# Patient Record
Sex: Male | Born: 1965
Health system: Southern US, Community
[De-identification: ages and names within clinical notes are randomized; demographics above are authoritative.]

## PROBLEM LIST (undated history)

## (undated) DIAGNOSIS — I10 Essential (primary) hypertension: Secondary | ICD-10-CM

## (undated) DIAGNOSIS — H548 Legal blindness, as defined in USA: Secondary | ICD-10-CM

## (undated) HISTORY — PX: NO PAST SURGERIES: SHX2092

---

## 2005-11-09 DIAGNOSIS — H548 Legal blindness, as defined in USA: Secondary | ICD-10-CM

## 2005-11-09 HISTORY — DX: Legal blindness, as defined in USA: H54.8

## 2019-06-27 ENCOUNTER — Ambulatory Visit (INDEPENDENT_AMBULATORY_CARE_PROVIDER_SITE_OTHER): Payer: Medicare Other | Admitting: Sports Medicine

## 2019-06-27 ENCOUNTER — Encounter: Payer: Self-pay | Admitting: Sports Medicine

## 2019-06-27 ENCOUNTER — Other Ambulatory Visit: Payer: Self-pay

## 2019-06-27 ENCOUNTER — Ambulatory Visit (INDEPENDENT_AMBULATORY_CARE_PROVIDER_SITE_OTHER): Payer: Medicare Other

## 2019-06-27 VITALS — BP 133/75 | Temp 97.3°F

## 2019-06-27 DIAGNOSIS — M79671 Pain in right foot: Secondary | ICD-10-CM | POA: Diagnosis not present

## 2019-06-27 DIAGNOSIS — M792 Neuralgia and neuritis, unspecified: Secondary | ICD-10-CM

## 2019-06-27 DIAGNOSIS — M7741 Metatarsalgia, right foot: Secondary | ICD-10-CM

## 2019-06-27 MED ORDER — DICLOFENAC SODIUM 1 % TD GEL: 4.0000 g | Freq: Four times a day (QID) | TRANSDERMAL | 1 refills | Status: DC

## 2019-06-27 NOTE — Progress Notes (Signed)
Subjective: Wayne Mckinney is a 53 y.o. male patient who presents to office for evaluation of Right foot pain. 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 Patient has tried epsom salt with no relief in symptoms. Patient denies any other pedal complaints. Denies injury/trip/fall/sprain/any causative factors.   Review of Systems  All other systems reviewed and are negative.    There are no active problems to display for this patient.   Current Outpatient Medications on File Prior to Visit  Medication Sig Dispense Refill  . amLODipine (NORVASC) 10 MG tablet Take 10 mg by mouth daily.    . hydrochlorothiazide (MICROZIDE) 12.5 MG capsule Take by mouth daily.     No current facility-administered medications on file prior to visit.     No Known Allergies  Objective:  General: Alert and oriented x3 in no acute distress  Dermatology: No open lesions bilateral lower extremities, no webspace macerations, no ecchymosis bilateral, all nails x 10 are well manicured.  Vascular: Dorsalis Pedis and Posterior Tibial pedal pulses palpable, Capillary Fill Time 3 seconds,(+) pedal hair growth bilateral, no edema bilateral lower extremities, Temperature gradient within normal limits.  Neurology: Wayne Mckinney sensation intact via light touch bilateral. Other sensations hard to discern due to blind/patient status  Musculoskeletal: Minimal pain diffusely to right ball. No other acute findings.   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  Landis Martins, DPM

## 2019-06-28 ENCOUNTER — Other Ambulatory Visit: Payer: Self-pay | Admitting: Sports Medicine

## 2019-06-28 DIAGNOSIS — M7741 Metatarsalgia, right foot: Secondary | ICD-10-CM

## 2019-12-14 DIAGNOSIS — Z0000 Encounter for general adult medical examination without abnormal findings: Secondary | ICD-10-CM | POA: Diagnosis not present

## 2019-12-14 DIAGNOSIS — Z79899 Other long term (current) drug therapy: Secondary | ICD-10-CM | POA: Diagnosis not present

## 2019-12-14 DIAGNOSIS — Z131 Encounter for screening for diabetes mellitus: Secondary | ICD-10-CM | POA: Diagnosis not present

## 2019-12-14 DIAGNOSIS — N529 Male erectile dysfunction, unspecified: Secondary | ICD-10-CM | POA: Diagnosis not present

## 2019-12-14 DIAGNOSIS — I10 Essential (primary) hypertension: Secondary | ICD-10-CM | POA: Diagnosis not present

## 2019-12-14 DIAGNOSIS — H548 Legal blindness, as defined in USA: Secondary | ICD-10-CM | POA: Diagnosis not present

## 2019-12-14 DIAGNOSIS — Z13220 Encounter for screening for lipoid disorders: Secondary | ICD-10-CM | POA: Diagnosis not present

## 2019-12-14 DIAGNOSIS — E559 Vitamin D deficiency, unspecified: Secondary | ICD-10-CM | POA: Diagnosis not present

## 2019-12-14 DIAGNOSIS — Z125 Encounter for screening for malignant neoplasm of prostate: Secondary | ICD-10-CM | POA: Diagnosis not present

## 2019-12-28 DIAGNOSIS — Z20828 Contact with and (suspected) exposure to other viral communicable diseases: Secondary | ICD-10-CM | POA: Diagnosis not present

## 2019-12-28 DIAGNOSIS — R05 Cough: Secondary | ICD-10-CM | POA: Diagnosis not present

## 2020-01-10 DIAGNOSIS — R05 Cough: Secondary | ICD-10-CM | POA: Diagnosis not present

## 2020-01-10 DIAGNOSIS — Z20828 Contact with and (suspected) exposure to other viral communicable diseases: Secondary | ICD-10-CM | POA: Diagnosis not present

## 2020-01-11 DIAGNOSIS — I10 Essential (primary) hypertension: Secondary | ICD-10-CM | POA: Diagnosis not present

## 2020-01-11 DIAGNOSIS — N529 Male erectile dysfunction, unspecified: Secondary | ICD-10-CM | POA: Diagnosis not present

## 2020-01-17 DIAGNOSIS — R05 Cough: Secondary | ICD-10-CM | POA: Diagnosis not present

## 2020-01-17 DIAGNOSIS — Z20828 Contact with and (suspected) exposure to other viral communicable diseases: Secondary | ICD-10-CM | POA: Diagnosis not present

## 2020-01-29 ENCOUNTER — Ambulatory Visit: Payer: Medicare Other | Attending: Internal Medicine

## 2020-01-29 ENCOUNTER — Ambulatory Visit: Payer: Self-pay

## 2020-01-29 DIAGNOSIS — Z23 Encounter for immunization: Secondary | ICD-10-CM

## 2020-01-29 NOTE — Progress Notes (Signed)
   Covid-19 Vaccination Clinic  Name:  Wayne Mckinney    MRN: [id number] DOB: 12/20/1965  01/29/2020  [name] was observed post Covid-19 immunization for 15 minutes without incident. He was provided with Vaccine Information Sheet and instruction to access the V-Safe system.   [name] was instructed to call 911 with any severe reactions post vaccine: Marland Kitchen Difficulty breathing  . Swelling of face and throat  . A fast heartbeat  . A bad rash all over body  . Dizziness and weakness   Immunizations Administered    Name Date Dose VIS Date Route   Pfizer COVID-19 Vaccine 01/29/2020  9:05 AM 0.3 mL 10/20/2019 Intramuscular   Manufacturer: [employer], [employer]   Lot: [id number]   NDC: [address]-3

## 2020-02-02 DIAGNOSIS — R05 Cough: Secondary | ICD-10-CM | POA: Diagnosis not present

## 2020-02-02 DIAGNOSIS — Z20828 Contact with and (suspected) exposure to other viral communicable diseases: Secondary | ICD-10-CM | POA: Diagnosis not present

## 2020-02-07 DIAGNOSIS — Z20828 Contact with and (suspected) exposure to other viral communicable diseases: Secondary | ICD-10-CM | POA: Diagnosis not present

## 2020-02-07 DIAGNOSIS — R05 Cough: Secondary | ICD-10-CM | POA: Diagnosis not present

## 2020-02-14 DIAGNOSIS — R05 Cough: Secondary | ICD-10-CM | POA: Diagnosis not present

## 2020-02-14 DIAGNOSIS — Z20828 Contact with and (suspected) exposure to other viral communicable diseases: Secondary | ICD-10-CM | POA: Diagnosis not present

## 2020-02-21 DIAGNOSIS — R05 Cough: Secondary | ICD-10-CM | POA: Diagnosis not present

## 2020-02-21 DIAGNOSIS — Z20828 Contact with and (suspected) exposure to other viral communicable diseases: Secondary | ICD-10-CM | POA: Diagnosis not present

## 2020-02-26 ENCOUNTER — Ambulatory Visit: Payer: Medicare Other | Attending: Internal Medicine

## 2020-02-26 DIAGNOSIS — Z23 Encounter for immunization: Secondary | ICD-10-CM

## 2020-02-26 NOTE — Progress Notes (Signed)
   Covid-19 Vaccination Clinic  Name:  Wayne Mckinney    MRN: [id number] DOB: 1966-05-06  02/26/2020  Mr. [name] was observed post Covid-19 immunization for 15 minutes without incident. He was provided with Vaccine Information Sheet and instruction to access the V-Safe system.   Mr. [name] was instructed to call 911 with any severe reactions post vaccine: Marland Kitchen Difficulty breathing  . Swelling of face and throat  . A fast heartbeat  . A bad rash all over body  . Dizziness and weakness   Immunizations Administered    Name Date Dose VIS Date Route   Pfizer COVID-19 Vaccine 02/26/2020  9:17 AM 0.3 mL 01/03/2019 Intramuscular   Manufacturer: [employer], [employer]   Lot: [id number]   NDC: [address]-6

## 2020-04-04 DIAGNOSIS — H548 Legal blindness, as defined in USA: Secondary | ICD-10-CM | POA: Diagnosis not present

## 2020-04-04 DIAGNOSIS — M25579 Pain in unspecified ankle and joints of unspecified foot: Secondary | ICD-10-CM | POA: Diagnosis not present

## 2020-04-04 DIAGNOSIS — I10 Essential (primary) hypertension: Secondary | ICD-10-CM | POA: Diagnosis not present

## 2020-07-18 DIAGNOSIS — H548 Legal blindness, as defined in USA: Secondary | ICD-10-CM | POA: Diagnosis not present

## 2020-07-18 DIAGNOSIS — E559 Vitamin D deficiency, unspecified: Secondary | ICD-10-CM | POA: Diagnosis not present

## 2020-07-18 DIAGNOSIS — Z23 Encounter for immunization: Secondary | ICD-10-CM | POA: Diagnosis not present

## 2020-07-18 DIAGNOSIS — I10 Essential (primary) hypertension: Secondary | ICD-10-CM | POA: Diagnosis not present

## 2020-08-02 DIAGNOSIS — Z20828 Contact with and (suspected) exposure to other viral communicable diseases: Secondary | ICD-10-CM | POA: Diagnosis not present

## 2020-08-02 DIAGNOSIS — R05 Cough: Secondary | ICD-10-CM | POA: Diagnosis not present

## 2020-08-03 DIAGNOSIS — R05 Cough: Secondary | ICD-10-CM | POA: Diagnosis not present

## 2020-08-03 DIAGNOSIS — Z20828 Contact with and (suspected) exposure to other viral communicable diseases: Secondary | ICD-10-CM | POA: Diagnosis not present

## 2020-11-06 ENCOUNTER — Ambulatory Visit (INDEPENDENT_AMBULATORY_CARE_PROVIDER_SITE_OTHER): Payer: BC Managed Care – PPO | Admitting: Podiatry

## 2020-11-06 ENCOUNTER — Encounter: Payer: Self-pay | Admitting: Podiatry

## 2020-11-06 ENCOUNTER — Other Ambulatory Visit: Payer: Self-pay

## 2020-11-06 DIAGNOSIS — M792 Neuralgia and neuritis, unspecified: Secondary | ICD-10-CM | POA: Diagnosis not present

## 2020-11-06 DIAGNOSIS — R200 Anesthesia of skin: Secondary | ICD-10-CM

## 2020-11-06 DIAGNOSIS — R202 Paresthesia of skin: Secondary | ICD-10-CM | POA: Diagnosis not present

## 2020-11-06 NOTE — Progress Notes (Signed)
  Subjective:  Patient ID: Wayne Mckinney, male    DOB: 11/22/1965,  MRN: [id number]  Chief Complaint  Patient presents with  . Numbness    Bilateral numbness in feet and moving up towards the legs PT stated that if he stands he will loose his balance and fall because of the pain.    54 y.o. male presents with the above complaint.  Patient presents with complaint of numbness and tingling to both feet that seems to be going all the way up the leg.  Patient states that hurts and it caused him to lose balance and fall again.  Patient states is pins and needle type of pain.  She would like to get it evaluated.  He has not seen anyone else prior to seeing me for this.  He is not a diabetic.  He does not have any history of sciatica that he is aware of.  He constantly sits down for his job and is constantly on his buttocks.   Review of Systems: Negative except as noted in the HPI. Denies N/V/F/Ch.  No past medical history on file.  Current Outpatient Medications:  .  amLODipine (NORVASC) 10 MG tablet, Take 10 mg by mouth daily., Disp: , Rfl:  .  diclofenac sodium (VOLTAREN) 1 % GEL, Apply 4 g topically 4 (four) times daily., Disp: 150 g, Rfl: 1 .  hydrochlorothiazide (MICROZIDE) 12.5 MG capsule, Take by mouth daily., Disp: , Rfl:   Social History   Tobacco Use  Smoking Status Former Smoker  Smokeless Tobacco Former User    No Known Allergies Objective:  There were no vitals filed for this visit. There is no height or weight on file to calculate BMI. Constitutional Well developed. Well nourished.  Vascular Dorsalis pedis pulses palpable bilaterally. Posterior tibial pulses palpable bilaterally. Capillary refill normal to all digits.  No cyanosis or clubbing noted. Pedal hair growth normal.  Neurologic Normal speech. Oriented to person, place, and time. Epicritic sensation to light touch grossly present bilaterally.  Subjective numbness and pain of and tingling that is extending from  the lower hip down to the foot and ankle.  Negative tarsal tunnel syndrome negative common peroneal syndrome  Dermatologic  nails within normal limits skin within normal limits.  Orthopedic: Normal joint ROM without pain or crepitus bilaterally. No visible deformities. No bony tenderness.   Radiographs: None Assessment:   1. Numbness and tingling   2. Neuritis    Plan:  Patient was evaluated and treated and all questions answered.  Bilateral numbness and tingling to lower extremity secondary to possible sciatica -I explained to the patient the etiology of numbness and tingling various treatment options were extensively discussed especially in the setting of diabetes or any other neurological medical history.  I believe that given the patient's work he required him to sit on his buttocks for most of the time it could be putting excessive pressure to the sciatic nerve leading to numbness and tingling down the both lower extremity.  There could also be a component of lower back as well.  I discussed this with patient extensive detail.  Patient states understanding will follow with the primary care doctor for a referral to a spine/orthopedic specialist.  No follow-ups on file.

## 2020-11-11 ENCOUNTER — Ambulatory Visit (HOSPITAL_COMMUNITY)
Admission: RE | Admit: 2020-11-11 | Discharge: 2020-11-11 | Disposition: A | Discharge status: Home / Self Care | Payer: BC Managed Care – PPO | Source: Ambulatory Visit | Attending: Neurological Surgery | Admitting: Neurological Surgery

## 2020-11-11 ENCOUNTER — Other Ambulatory Visit: Payer: Self-pay | Admitting: Neurological Surgery

## 2020-11-11 DIAGNOSIS — R270 Ataxia, unspecified: Secondary | ICD-10-CM | POA: Diagnosis not present

## 2020-11-11 DIAGNOSIS — R29898 Other symptoms and signs involving the musculoskeletal system: Secondary | ICD-10-CM | POA: Diagnosis not present

## 2020-11-11 DIAGNOSIS — M4714 Other spondylosis with myelopathy, thoracic region: Secondary | ICD-10-CM | POA: Diagnosis not present

## 2020-11-11 DIAGNOSIS — R200 Anesthesia of skin: Secondary | ICD-10-CM

## 2020-11-11 DIAGNOSIS — Z20828 Contact with and (suspected) exposure to other viral communicable diseases: Secondary | ICD-10-CM | POA: Diagnosis not present

## 2020-11-11 DIAGNOSIS — M5414 Radiculopathy, thoracic region: Secondary | ICD-10-CM | POA: Diagnosis not present

## 2020-11-11 DIAGNOSIS — M2428 Disorder of ligament, vertebrae: Secondary | ICD-10-CM | POA: Diagnosis not present

## 2020-11-11 DIAGNOSIS — M47814 Spondylosis without myelopathy or radiculopathy, thoracic region: Secondary | ICD-10-CM | POA: Diagnosis not present

## 2020-11-11 DIAGNOSIS — M47812 Spondylosis without myelopathy or radiculopathy, cervical region: Secondary | ICD-10-CM | POA: Diagnosis not present

## 2020-11-11 DIAGNOSIS — M5126 Other intervertebral disc displacement, lumbar region: Secondary | ICD-10-CM | POA: Diagnosis not present

## 2020-11-11 DIAGNOSIS — M47816 Spondylosis without myelopathy or radiculopathy, lumbar region: Secondary | ICD-10-CM | POA: Diagnosis not present

## 2020-11-12 DIAGNOSIS — Z20828 Contact with and (suspected) exposure to other viral communicable diseases: Secondary | ICD-10-CM | POA: Diagnosis not present

## 2020-11-13 ENCOUNTER — Other Ambulatory Visit (HOSPITAL_COMMUNITY): Payer: Self-pay | Admitting: Neurological Surgery

## 2020-11-13 ENCOUNTER — Other Ambulatory Visit: Payer: Self-pay | Admitting: Neurological Surgery

## 2020-11-13 DIAGNOSIS — G9520 Unspecified cord compression: Secondary | ICD-10-CM

## 2020-11-13 DIAGNOSIS — R29898 Other symptoms and signs involving the musculoskeletal system: Secondary | ICD-10-CM | POA: Diagnosis not present

## 2020-11-13 DIAGNOSIS — I10 Essential (primary) hypertension: Secondary | ICD-10-CM | POA: Diagnosis not present

## 2020-11-14 DIAGNOSIS — I10 Essential (primary) hypertension: Secondary | ICD-10-CM | POA: Diagnosis not present

## 2020-11-14 DIAGNOSIS — M5136 Other intervertebral disc degeneration, lumbar region: Secondary | ICD-10-CM | POA: Diagnosis not present

## 2020-11-15 ENCOUNTER — Other Ambulatory Visit: Payer: Self-pay

## 2020-11-15 ENCOUNTER — Other Ambulatory Visit: Payer: Self-pay | Admitting: Neurological Surgery

## 2020-11-15 ENCOUNTER — Ambulatory Visit (HOSPITAL_COMMUNITY)
Admission: RE | Admit: 2020-11-15 | Discharge: 2020-11-15 | Disposition: A | Discharge status: Home / Self Care | Payer: BC Managed Care – PPO | Source: Ambulatory Visit | Attending: Neurological Surgery | Admitting: Neurological Surgery

## 2020-11-15 DIAGNOSIS — M47812 Spondylosis without myelopathy or radiculopathy, cervical region: Secondary | ICD-10-CM | POA: Diagnosis not present

## 2020-11-15 DIAGNOSIS — G9520 Unspecified cord compression: Secondary | ICD-10-CM | POA: Diagnosis not present

## 2020-11-15 DIAGNOSIS — M50223 Other cervical disc displacement at C6-C7 level: Secondary | ICD-10-CM | POA: Diagnosis not present

## 2020-11-15 DIAGNOSIS — M4802 Spinal stenosis, cervical region: Secondary | ICD-10-CM | POA: Diagnosis not present

## 2020-11-15 DIAGNOSIS — M2578 Osteophyte, vertebrae: Secondary | ICD-10-CM | POA: Diagnosis not present

## 2020-11-18 ENCOUNTER — Other Ambulatory Visit (HOSPITAL_COMMUNITY)
Admission: RE | Admit: 2020-11-18 | Discharge: 2020-11-18 | Disposition: A | Discharge status: Home / Self Care | Payer: BC Managed Care – PPO | Source: Ambulatory Visit | Attending: Neurological Surgery | Admitting: Neurological Surgery

## 2020-11-18 ENCOUNTER — Encounter (HOSPITAL_COMMUNITY): Payer: Self-pay | Admitting: Neurological Surgery

## 2020-11-18 DIAGNOSIS — M4322 Fusion of spine, cervical region: Secondary | ICD-10-CM | POA: Diagnosis not present

## 2020-11-18 DIAGNOSIS — M62830 Muscle spasm of back: Secondary | ICD-10-CM | POA: Diagnosis not present

## 2020-11-18 DIAGNOSIS — R292 Abnormal reflex: Secondary | ICD-10-CM | POA: Diagnosis not present

## 2020-11-18 DIAGNOSIS — R270 Ataxia, unspecified: Secondary | ICD-10-CM | POA: Diagnosis not present

## 2020-11-18 DIAGNOSIS — Z01812 Encounter for preprocedural laboratory examination: Secondary | ICD-10-CM | POA: Insufficient documentation

## 2020-11-18 DIAGNOSIS — Z20822 Contact with and (suspected) exposure to covid-19: Secondary | ICD-10-CM | POA: Insufficient documentation

## 2020-11-18 DIAGNOSIS — M50023 Cervical disc disorder at C6-C7 level with myelopathy: Secondary | ICD-10-CM | POA: Diagnosis not present

## 2020-11-18 DIAGNOSIS — R296 Repeated falls: Secondary | ICD-10-CM | POA: Diagnosis not present

## 2020-11-18 DIAGNOSIS — Z79899 Other long term (current) drug therapy: Secondary | ICD-10-CM | POA: Diagnosis not present

## 2020-11-18 DIAGNOSIS — S8002XA Contusion of left knee, initial encounter: Secondary | ICD-10-CM | POA: Diagnosis not present

## 2020-11-18 DIAGNOSIS — K5901 Slow transit constipation: Secondary | ICD-10-CM | POA: Diagnosis not present

## 2020-11-18 DIAGNOSIS — H3530 Unspecified macular degeneration: Secondary | ICD-10-CM | POA: Diagnosis not present

## 2020-11-18 DIAGNOSIS — G9520 Unspecified cord compression: Secondary | ICD-10-CM | POA: Diagnosis not present

## 2020-11-18 DIAGNOSIS — G959 Disease of spinal cord, unspecified: Secondary | ICD-10-CM | POA: Diagnosis not present

## 2020-11-18 DIAGNOSIS — K5900 Constipation, unspecified: Secondary | ICD-10-CM | POA: Diagnosis not present

## 2020-11-18 DIAGNOSIS — E8809 Other disorders of plasma-protein metabolism, not elsewhere classified: Secondary | ICD-10-CM | POA: Diagnosis not present

## 2020-11-18 DIAGNOSIS — I10 Essential (primary) hypertension: Secondary | ICD-10-CM | POA: Diagnosis not present

## 2020-11-18 DIAGNOSIS — R569 Unspecified convulsions: Secondary | ICD-10-CM | POA: Diagnosis not present

## 2020-11-18 DIAGNOSIS — Z4789 Encounter for other orthopedic aftercare: Secondary | ICD-10-CM | POA: Diagnosis not present

## 2020-11-18 DIAGNOSIS — Z23 Encounter for immunization: Secondary | ICD-10-CM | POA: Diagnosis not present

## 2020-11-18 DIAGNOSIS — H548 Legal blindness, as defined in USA: Secondary | ICD-10-CM | POA: Diagnosis not present

## 2020-11-18 DIAGNOSIS — M4802 Spinal stenosis, cervical region: Secondary | ICD-10-CM | POA: Diagnosis not present

## 2020-11-18 DIAGNOSIS — G629 Polyneuropathy, unspecified: Secondary | ICD-10-CM | POA: Diagnosis not present

## 2020-11-18 DIAGNOSIS — M792 Neuralgia and neuritis, unspecified: Secondary | ICD-10-CM | POA: Diagnosis not present

## 2020-11-18 DIAGNOSIS — M4712 Other spondylosis with myelopathy, cervical region: Secondary | ICD-10-CM | POA: Diagnosis not present

## 2020-11-18 DIAGNOSIS — E876 Hypokalemia: Secondary | ICD-10-CM | POA: Diagnosis not present

## 2020-11-18 DIAGNOSIS — Z87891 Personal history of nicotine dependence: Secondary | ICD-10-CM | POA: Diagnosis not present

## 2020-11-18 DIAGNOSIS — Z981 Arthrodesis status: Secondary | ICD-10-CM | POA: Diagnosis not present

## 2020-11-18 LAB — SARS CORONAVIRUS 2 (TAT 6-24 HRS): SARS Coronavirus 2: NEGATIVE

## 2020-11-18 NOTE — Progress Notes (Addendum)
Patient denies cardiology visit. Patient reports tightness in the side of his chest and mild shortness of breath. Patient reports that he attributes this to his neck issues. Notified [place], [place].  Reports being in quarantine since COVID test. Educated on visitation policy. PCP [facility]. Requested EKG and OV from [facility].

## 2020-11-18 NOTE — Anesthesia Preprocedure Evaluation (Addendum)
Anesthesia Evaluation  Patient identified by MRN, date of birth, ID band Patient awake    Reviewed: Allergy & Precautions, H&P , NPO status , Patient's Chart, lab work & pertinent test results  Airway Mallampati: II   Neck ROM: limited    Dental   Pulmonary former smoker,    breath sounds clear to auscultation       Cardiovascular hypertension,  Rhythm:regular Rate:Normal     Neuro/Psych Cervical cord compression  Neuromuscular disease    GI/Hepatic   Endo/Other    Renal/GU      Musculoskeletal   Abdominal   Peds  Hematology   Anesthesia Other Findings   Reproductive/Obstetrics                            Anesthesia Physical Anesthesia Plan  ASA: II  Anesthesia Plan: General   Post-op Pain Management:    Induction: Intravenous  PONV Risk Score and Plan: 2 and Ondansetron, Dexamethasone, Midazolam and Treatment may vary due to age or medical condition  Airway Management Planned: Oral ETT  Additional Equipment:   Intra-op Plan:   Post-operative Plan: Extubation in OR  Informed Consent: I have reviewed the patients History and Physical, chart, labs and discussed the procedure including the risks, benefits and alternatives for the proposed anesthesia with the patient or authorized representative who has indicated his/her understanding and acceptance.       Plan Discussed with: CRNA, Anesthesiologist and Surgeon  Anesthesia Plan Comments: (See PAT note written 11/18/2020 by Shonna Chock, PA-C. SAME DAY WORK-UP. For labs, EKG on day of surgery.    )       Anesthesia Quick Evaluation

## 2020-11-18 NOTE — Progress Notes (Signed)
Anesthesia Chart Review:  Case: [id number] Date/Time: 11/19/20 1136   Procedure: ACDF C56, C67 (N/A ) - 3C   Anesthesia type: General   Pre-op diagnosis: CERVICAL CORD COMPRESSION   Location: [facility] ROOM 20 / [facility] OR   Surgeons: Dawley, Alan Mulder, DO      DISCUSSION: Patient is a 55 year old male scheduled for the above procedure.   History includes former smoker (quit ~ 2017), HTN, legally blind. Alcohol use is documented as 2 standard drinks/week.   He was urgently referred to spine surgery following 11/06/20 visit with Nicholes Rough, DPM for BLE N/T with pain and causing him to loss balance and fall. He was seen by Dr. Jake Samples on 11/11/20. Also reported some lower trunk sensory changes radiating from his back and had "slight hyperreflexia" in his legs. MRI T-L spine ordered to evaluate for thoracic myelopathy versus neurogenic claudication and showed disc herniation at C6- with appearance of central cord compression prompting c-spine MRI. This was done on 11/16/19 and showed severe spinal canal stenosis with spinal cord compression at C6-7 and moderate C5-6 spinal canal stenosis with contact up the spinal cords and severe foraminal narrowing, moderate degenerative endplate edema at P1-0/C5-8. He was considered for direct admit, but given factors related to the on-going COVID-19 pandemic a decision was made to manage out-patient with urgent scheduling of surgery on 11/19/20.   I called and spoke with patient as limited records available (although primary care records have been requested). He receives primary care through [facility]. He also gets a yearly home nurse visit through his insurance, and this visit happened to occur on 11/16/20. He says BP was 130/82 then. He believes last labs were done in 2021 (prior to June) and EKG in 2020 (for routine physical) and showed no significant findings. He is unaware of any kidney, liver, or cardiac issues. No known anemia. He is blind but was working prior to spinal  issues. He reported that prior to Christmas 2021 he noticed some N/T in his BLE which he didn't pay much attention to, but had increased symptoms just before Christmas and actually had a bad fall at home ~ 11/03/20 where his knees buckled and his left knee hyperextended. (He says he has had some resultant swelling in his knees, left > right since then.) N/T has progressed, and he has since had 2-3 additional falls at home as his legs feel numb and feet have tingling. Since his spinal issues around Christmas 2021 he has also noted a feeling like a rope is tightly tied around his costal margin (all the way around his chest, front and back, although worse anteriorly). The sensation is there all the times, but more notable with certain movements. He feels like it causes him to feel short of breath, although not limiting. No syncope. Did report one episode of feeling disoriented upon bending over just after standing. No significant palpitations, but on occasion when feeling anxious. He said that prior to his spinal issues he was "very active" and said he could walk up 2 flights of stairs without CV symptoms. He discussed with Dr. Jake Samples who reportedly felt symptoms were likely related to his spinal issues.    He has cervical cord compression on 11/16/19 MRI. He is a same day work-up, so further evaluation by his anesthesia team on the day of surgery. He is for labs and EKG on arrival. He received his 2nd Pfizer COVID-19 vaccine 02/26/20. 11/18/20 preoperative COVID-19 test in process.  VS: Ht 6\' 2"  (1.88 m)   Wt 89.8 kg   BMI 25.42 kg/m  For day of surgery.    PROVIDERS: , MD is PCP [facility] on [address] in [place]). Records requested, but pending.    LABS: For day of surgery.    IMAGES: MRI C-spine 11/15/20: IMPRESSION: - Cervical spondylosis superimposed upon a congenitally narrow cervical spinal canal, as outlined with findings most notably as follows. - At  C6-C7, there is a posterior disc osteophyte complex with superimposed central disc protrusion. Uncovertebral, facet and ligamentum flavum hypertrophy. Severe spinal canal stenosis with spinal cord compression. T2 hyperintense signal abnormality within the spinal cord at this level compatible with edema and/or myelomalacia. Severe bilateral neural foraminal narrowing. - At C5-C6, there is multifactorial moderate spinal canal stenosis with contact upon the dorsal and ventral spinal cord. Severe bilateral neural foraminal narrowing. - No significant spinal canal stenosis at the remaining levels. Additional sites of neural foraminal narrowing as detailed and greatest bilaterally at C3-C4 (moderate right, severe left). - Moderate degenerative endplate edema at 01/13/21 and C6-C7.   MRI T/L spine 11/11/20: IMPRESSION: MR THORACIC SPINE 1. Minor for age thoracic spondylosis without significant disc pathology, stenosis, or evidence for impingement. 2. Degenerative spondylosis at C5-6 and C6-7, incompletely assessed on this examination. Suspected at least moderate spinal stenosis and possible cord deformity at the C6-7 level. Finding could be further assessed with dedicated MRI of the cervical spine as clinically warranted. MR LUMBAR SPINE 1. Small left foraminal to extraforaminal disc protrusion at L3-4, closely approximating and potentially irritating the exiting left L3 nerve root. 2. Additional minor degenerative disc disease at L1-2 through L4-5 for age. No other significant stenosis or evidence for neural impingement.   EKG: He believes last EKG was in 2020 at [facility] (done during routine physical). Attempting to get a copy of that tracing for comparison. He is for EKG on arrival given HTN history.     CV: Denied prior stress, echo, cath.    Past Medical History:  Diagnosis Date  . Hypertension   . Legally blind     Past Surgical History:  Procedure Laterality  Date  . NO PAST SURGERIES      MEDICATIONS: No current facility-administered medications for this encounter.   [facility] amLODipine (NORVASC) 10 MG tablet  . hydrochlorothiazide (MICROZIDE) 12.5 MG capsule    Marland Kitchen, PA-C Surgical Short Stay/Anesthesiology [facility] Phone [phone] [facility] Phone [phone] 11/18/2020 1:34 PM

## 2020-11-19 ENCOUNTER — Other Ambulatory Visit: Payer: Self-pay | Admitting: Neurological Surgery

## 2020-11-19 ENCOUNTER — Ambulatory Visit (HOSPITAL_COMMUNITY): Payer: BC Managed Care – PPO

## 2020-11-19 ENCOUNTER — Ambulatory Visit (HOSPITAL_COMMUNITY): Payer: BC Managed Care – PPO | Admitting: Vascular Surgery

## 2020-11-19 ENCOUNTER — Other Ambulatory Visit: Payer: Self-pay

## 2020-11-19 ENCOUNTER — Inpatient Hospital Stay (HOSPITAL_COMMUNITY)
Admission: AD | Admit: 2020-11-19 | Discharge: 2020-11-23 | DRG: 472 | Disposition: A | Discharge status: Rehabilitation Facility | Payer: BC Managed Care – PPO | Attending: Neurological Surgery | Admitting: Neurological Surgery

## 2020-11-19 ENCOUNTER — Encounter (HOSPITAL_COMMUNITY): Payer: Self-pay | Admitting: Neurological Surgery

## 2020-11-19 ENCOUNTER — Encounter (HOSPITAL_COMMUNITY)
Admission: AD | Disposition: A | Discharge status: Rehabilitation Facility | Payer: Self-pay | Source: Home / Self Care | Attending: Neurological Surgery

## 2020-11-19 DIAGNOSIS — Z79899 Other long term (current) drug therapy: Secondary | ICD-10-CM

## 2020-11-19 DIAGNOSIS — M50023 Cervical disc disorder at C6-C7 level with myelopathy: Secondary | ICD-10-CM | POA: Diagnosis present

## 2020-11-19 DIAGNOSIS — H548 Legal blindness, as defined in USA: Secondary | ICD-10-CM | POA: Diagnosis present

## 2020-11-19 DIAGNOSIS — R270 Ataxia, unspecified: Secondary | ICD-10-CM | POA: Diagnosis present

## 2020-11-19 DIAGNOSIS — R296 Repeated falls: Secondary | ICD-10-CM | POA: Diagnosis present

## 2020-11-19 DIAGNOSIS — M4712 Other spondylosis with myelopathy, cervical region: Secondary | ICD-10-CM | POA: Diagnosis not present

## 2020-11-19 DIAGNOSIS — G959 Disease of spinal cord, unspecified: Secondary | ICD-10-CM | POA: Diagnosis present

## 2020-11-19 DIAGNOSIS — K5900 Constipation, unspecified: Secondary | ICD-10-CM | POA: Diagnosis present

## 2020-11-19 DIAGNOSIS — Z87891 Personal history of nicotine dependence: Secondary | ICD-10-CM

## 2020-11-19 DIAGNOSIS — Z981 Arthrodesis status: Secondary | ICD-10-CM | POA: Diagnosis not present

## 2020-11-19 DIAGNOSIS — I10 Essential (primary) hypertension: Secondary | ICD-10-CM | POA: Diagnosis not present

## 2020-11-19 DIAGNOSIS — Z419 Encounter for procedure for purposes other than remedying health state, unspecified: Secondary | ICD-10-CM

## 2020-11-19 DIAGNOSIS — Z23 Encounter for immunization: Secondary | ICD-10-CM | POA: Diagnosis not present

## 2020-11-19 DIAGNOSIS — M792 Neuralgia and neuritis, unspecified: Secondary | ICD-10-CM | POA: Diagnosis not present

## 2020-11-19 DIAGNOSIS — S8002XA Contusion of left knee, initial encounter: Secondary | ICD-10-CM | POA: Diagnosis present

## 2020-11-19 DIAGNOSIS — G629 Polyneuropathy, unspecified: Secondary | ICD-10-CM | POA: Diagnosis present

## 2020-11-19 DIAGNOSIS — M4802 Spinal stenosis, cervical region: Secondary | ICD-10-CM | POA: Diagnosis present

## 2020-11-19 DIAGNOSIS — Z20822 Contact with and (suspected) exposure to covid-19: Secondary | ICD-10-CM | POA: Diagnosis present

## 2020-11-19 DIAGNOSIS — Z4789 Encounter for other orthopedic aftercare: Secondary | ICD-10-CM | POA: Diagnosis present

## 2020-11-19 DIAGNOSIS — H3530 Unspecified macular degeneration: Secondary | ICD-10-CM | POA: Diagnosis present

## 2020-11-19 DIAGNOSIS — R292 Abnormal reflex: Secondary | ICD-10-CM | POA: Diagnosis present

## 2020-11-19 DIAGNOSIS — E8809 Other disorders of plasma-protein metabolism, not elsewhere classified: Secondary | ICD-10-CM | POA: Diagnosis present

## 2020-11-19 DIAGNOSIS — M62830 Muscle spasm of back: Secondary | ICD-10-CM | POA: Diagnosis present

## 2020-11-19 DIAGNOSIS — R569 Unspecified convulsions: Secondary | ICD-10-CM | POA: Diagnosis present

## 2020-11-19 DIAGNOSIS — K5901 Slow transit constipation: Secondary | ICD-10-CM | POA: Diagnosis not present

## 2020-11-19 DIAGNOSIS — G9520 Unspecified cord compression: Secondary | ICD-10-CM | POA: Diagnosis not present

## 2020-11-19 DIAGNOSIS — E876 Hypokalemia: Secondary | ICD-10-CM | POA: Diagnosis not present

## 2020-11-19 HISTORY — PX: ANTERIOR CERVICAL DECOMP/DISCECTOMY FUSION: SHX1161

## 2020-11-19 HISTORY — DX: Legal blindness, as defined in USA: H54.8

## 2020-11-19 HISTORY — DX: Essential (primary) hypertension: I10

## 2020-11-19 LAB — CBC WITH DIFFERENTIAL/PLATELET
Abs Immature Granulocytes: 0.01 10*3/uL (ref 0.00–0.07)
Basophils Absolute: 0 10*3/uL (ref 0.0–0.1)
Basophils Relative: 1 %
Eosinophils Absolute: 0 10*3/uL (ref 0.0–0.5)
Eosinophils Relative: 1 %
HCT: 47.1 % (ref 39.0–52.0)
Hemoglobin: 15.5 g/dL (ref 13.0–17.0)
Immature Granulocytes: 0 %
Lymphocytes Relative: 30 %
Lymphs Abs: 1.5 10*3/uL (ref 0.7–4.0)
MCH: 29 pg (ref 26.0–34.0)
MCHC: 32.9 g/dL (ref 30.0–36.0)
MCV: 88.2 fL (ref 80.0–100.0)
Monocytes Absolute: 0.4 10*3/uL (ref 0.1–1.0)
Monocytes Relative: 8 %
Neutro Abs: 3 10*3/uL (ref 1.7–7.7)
Neutrophils Relative %: 60 %
Platelets: 222 10*3/uL (ref 150–400)
RBC: 5.34 MIL/uL (ref 4.22–5.81)
RDW: 12.8 % (ref 11.5–15.5)
WBC: 5 10*3/uL (ref 4.0–10.5)
nRBC: 0 % (ref 0.0–0.2)

## 2020-11-19 LAB — BASIC METABOLIC PANEL
Anion gap: 9 (ref 5–15)
BUN: 18 mg/dL (ref 6–20)
CO2: 27 mmol/L (ref 22–32)
Calcium: 9.6 mg/dL (ref 8.9–10.3)
Chloride: 103 mmol/L (ref 98–111)
Creatinine, Ser: 1.06 mg/dL (ref 0.61–1.24)
GFR, Estimated: >60 mL/min (ref >60)
Glucose, Bld: 95 mg/dL (ref 70–99)
Potassium: 3.7 mmol/L (ref 3.5–5.1)
Sodium: 139 mmol/L (ref 135–145)

## 2020-11-19 LAB — TYPE AND SCREEN
ABO/RH(D): A POS
Antibody Screen: NEGATIVE

## 2020-11-19 LAB — SURGICAL PCR SCREEN
MRSA, PCR: NEGATIVE
Staphylococcus aureus: NEGATIVE

## 2020-11-19 LAB — PROTIME-INR
INR: 1 (ref 0.8–1.2)
Prothrombin Time: 12.6 seconds (ref 11.4–15.2)

## 2020-11-19 LAB — ABO/RH: ABO/RH(D): A POS

## 2020-11-19 SURGERY — ANTERIOR CERVICAL DECOMPRESSION/DISCECTOMY FUSION 2 LEVELS
Anesthesia: General | Site: Spine Cervical

## 2020-11-19 MED ORDER — ACETAMINOPHEN 650 MG RE SUPP: 650.0000 mg | RECTAL | Status: DC | PRN

## 2020-11-19 MED ORDER — SUGAMMADEX SODIUM 200 MG/2ML IV SOLN
INTRAVENOUS | Status: DC | PRN
  Administered 2020-11-19: 200 mg via INTRAVENOUS

## 2020-11-19 MED ORDER — MIDAZOLAM HCL 5 MG/5ML IJ SOLN
INTRAMUSCULAR | Status: DC | PRN
  Administered 2020-11-19: 2 mg via INTRAVENOUS

## 2020-11-19 MED ORDER — ONDANSETRON HCL 4 MG/2ML IJ SOLN: 4.0000 mg | Freq: Four times a day (QID) | INTRAMUSCULAR | Status: DC | PRN

## 2020-11-19 MED ORDER — PROPOFOL 10 MG/ML IV BOLUS
INTRAVENOUS | Status: DC | PRN
  Administered 2020-11-19: 200 mg via INTRAVENOUS

## 2020-11-19 MED ORDER — METHOCARBAMOL 1000 MG/10ML IJ SOLN
500.0000 mg | Freq: Four times a day (QID) | INTRAVENOUS | Status: DC | PRN
  Filled 2020-11-19: qty 5

## 2020-11-19 MED ORDER — LACTATED RINGERS IV SOLN: INTRAVENOUS | Status: DC

## 2020-11-19 MED ORDER — SENNOSIDES-DOCUSATE SODIUM 8.6-50 MG PO TABS
1.0000 | ORAL_TABLET | Freq: Every evening | ORAL | Status: DC | PRN
  Administered 2020-11-20: 1 via ORAL
  Filled 2020-11-19: qty 1

## 2020-11-19 MED ORDER — CEFAZOLIN SODIUM-DEXTROSE 2-4 GM/100ML-% IV SOLN
2.0000 g | Freq: Three times a day (TID) | INTRAVENOUS | Status: AC
  Administered 2020-11-19 – 2020-11-20 (×2): 2 g via INTRAVENOUS
  Filled 2020-11-19 (×2): qty 100

## 2020-11-19 MED ORDER — HEMOSTATIC AGENTS (NO CHARGE) OPTIME
TOPICAL | Status: DC | PRN
  Administered 2020-11-19: 1 via TOPICAL

## 2020-11-19 MED ORDER — FENTANYL CITRATE (PF) 100 MCG/2ML IJ SOLN
INTRAMUSCULAR | Status: AC
  Filled 2020-11-19: qty 2

## 2020-11-19 MED ORDER — 0.9 % SODIUM CHLORIDE (POUR BTL) OPTIME
TOPICAL | Status: DC | PRN
  Administered 2020-11-19: 1000 mL

## 2020-11-19 MED ORDER — AMLODIPINE BESYLATE 5 MG PO TABS
10.0000 mg | ORAL_TABLET | Freq: Every day | ORAL | Status: DC
Start: 2020-11-20 — End: 2020-11-23
  Administered 2020-11-20 – 2020-11-23 (×4): 10 mg via ORAL
  Filled 2020-11-19 (×4): qty 2

## 2020-11-19 MED ORDER — CHLORHEXIDINE GLUCONATE 0.12 % MT SOLN
15.0000 mL | Freq: Once | OROMUCOSAL | Status: AC
  Administered 2020-11-19: 15 mL via OROMUCOSAL
  Filled 2020-11-19: qty 15

## 2020-11-19 MED ORDER — ROCURONIUM BROMIDE 10 MG/ML (PF) SYRINGE
PREFILLED_SYRINGE | INTRAVENOUS | Status: DC | PRN
  Administered 2020-11-19: 50 mg via INTRAVENOUS
  Administered 2020-11-19: 30 mg via INTRAVENOUS
  Administered 2020-11-19: 20 mg via INTRAVENOUS
  Administered 2020-11-19: 50 mg via INTRAVENOUS

## 2020-11-19 MED ORDER — HYDROCODONE-ACETAMINOPHEN 5-325 MG PO TABS
1.0000 | ORAL_TABLET | ORAL | Status: DC | PRN
  Administered 2020-11-19 – 2020-11-23 (×11): 1 via ORAL
  Filled 2020-11-19 (×11): qty 1

## 2020-11-19 MED ORDER — SODIUM CHLORIDE 0.9 % IV SOLN: INTRAVENOUS | Status: DC

## 2020-11-19 MED ORDER — DEXAMETHASONE SODIUM PHOSPHATE 10 MG/ML IJ SOLN
INTRAMUSCULAR | Status: AC
  Filled 2020-11-19: qty 1

## 2020-11-19 MED ORDER — HYDROCHLOROTHIAZIDE 12.5 MG PO CAPS
12.5000 mg | ORAL_CAPSULE | Freq: Every day | ORAL | Status: DC
Start: 2020-11-19 — End: 2020-11-23
  Administered 2020-11-19 – 2020-11-23 (×5): 12.5 mg via ORAL
  Filled 2020-11-19 (×5): qty 1

## 2020-11-19 MED ORDER — METHOCARBAMOL 500 MG PO TABS
ORAL_TABLET | ORAL | Status: AC
  Filled 2020-11-19: qty 1

## 2020-11-19 MED ORDER — FENTANYL CITRATE (PF) 250 MCG/5ML IJ SOLN
INTRAMUSCULAR | Status: DC | PRN
  Administered 2020-11-19: 150 ug via INTRAVENOUS
  Administered 2020-11-19: 50 ug via INTRAVENOUS

## 2020-11-19 MED ORDER — MIDAZOLAM HCL 2 MG/2ML IJ SOLN
INTRAMUSCULAR | Status: AC
  Filled 2020-11-19: qty 2

## 2020-11-19 MED ORDER — CHLORHEXIDINE GLUCONATE CLOTH 2 % EX PADS: 6.0000 | MEDICATED_PAD | Freq: Once | CUTANEOUS | Status: DC

## 2020-11-19 MED ORDER — TRIAMCINOLONE ACETONIDE 40 MG/ML IJ SUSP
INTRAMUSCULAR | Status: DC | PRN
  Administered 2020-11-19: 200 mg

## 2020-11-19 MED ORDER — SUGAMMADEX SODIUM 500 MG/5ML IV SOLN
INTRAVENOUS | Status: AC
  Filled 2020-11-19: qty 5

## 2020-11-19 MED ORDER — ORAL CARE MOUTH RINSE: 15.0000 mL | Freq: Once | OROMUCOSAL | Status: AC

## 2020-11-19 MED ORDER — OXYCODONE HCL 5 MG/5ML PO SOLN: 5.0000 mg | Freq: Once | ORAL | Status: DC | PRN

## 2020-11-19 MED ORDER — OXYCODONE HCL 5 MG PO TABS: 5.0000 mg | ORAL_TABLET | Freq: Once | ORAL | Status: DC | PRN

## 2020-11-19 MED ORDER — MENTHOL 3 MG MT LOZG: 1.0000 | LOZENGE | OROMUCOSAL | Status: DC | PRN

## 2020-11-19 MED ORDER — SODIUM CHLORIDE 0.9 % IV SOLN: 250.0000 mL | INTRAVENOUS | Status: DC

## 2020-11-19 MED ORDER — ONDANSETRON HCL 4 MG/2ML IJ SOLN
INTRAMUSCULAR | Status: DC | PRN
  Administered 2020-11-19: 4 mg via INTRAVENOUS

## 2020-11-19 MED ORDER — ACETAMINOPHEN 10 MG/ML IV SOLN
INTRAVENOUS | Status: AC
  Filled 2020-11-19: qty 100

## 2020-11-19 MED ORDER — SODIUM CHLORIDE 0.9% FLUSH: 3.0000 mL | Freq: Two times a day (BID) | INTRAVENOUS | Status: DC

## 2020-11-19 MED ORDER — FENTANYL CITRATE (PF) 250 MCG/5ML IJ SOLN
INTRAMUSCULAR | Status: AC
  Filled 2020-11-19: qty 5

## 2020-11-19 MED ORDER — METHOCARBAMOL 500 MG PO TABS
500.0000 mg | ORAL_TABLET | Freq: Four times a day (QID) | ORAL | Status: DC | PRN
  Administered 2020-11-19 – 2020-11-23 (×8): 500 mg via ORAL
  Filled 2020-11-19 (×6): qty 1

## 2020-11-19 MED ORDER — PROPOFOL 10 MG/ML IV BOLUS
INTRAVENOUS | Status: AC
  Filled 2020-11-19: qty 20

## 2020-11-19 MED ORDER — LIDOCAINE 2% (20 MG/ML) 5 ML SYRINGE
INTRAMUSCULAR | Status: DC | PRN
  Administered 2020-11-19: 60 mg via INTRAVENOUS

## 2020-11-19 MED ORDER — PROPOFOL 500 MG/50ML IV EMUL
INTRAVENOUS | Status: DC | PRN
Start: 2020-11-19 — End: 2020-11-19
  Administered 2020-11-19 (×2): 75 ug/kg/min via INTRAVENOUS

## 2020-11-19 MED ORDER — LIDOCAINE 2% (20 MG/ML) 5 ML SYRINGE
INTRAMUSCULAR | Status: AC
  Filled 2020-11-19: qty 5

## 2020-11-19 MED ORDER — ONDANSETRON HCL 4 MG/2ML IJ SOLN
INTRAMUSCULAR | Status: AC
  Filled 2020-11-19: qty 2

## 2020-11-19 MED ORDER — SODIUM CHLORIDE 0.9% FLUSH: 3.0000 mL | INTRAVENOUS | Status: DC | PRN

## 2020-11-19 MED ORDER — THROMBIN 5000 UNITS EX SOLR
OROMUCOSAL | Status: DC | PRN
  Administered 2020-11-19: 5 mL via TOPICAL

## 2020-11-19 MED ORDER — PHENOL 1.4 % MT LIQD: 1.0000 | OROMUCOSAL | Status: DC | PRN

## 2020-11-19 MED ORDER — HYDROMORPHONE HCL 1 MG/ML IJ SOLN
0.5000 mg | INTRAMUSCULAR | Status: DC | PRN
Start: 2020-11-19 — End: 2020-11-23

## 2020-11-19 MED ORDER — ALUM & MAG HYDROXIDE-SIMETH 200-200-20 MG/5ML PO SUSP: 30.0000 mL | Freq: Four times a day (QID) | ORAL | Status: DC | PRN

## 2020-11-19 MED ORDER — ONDANSETRON HCL 4 MG PO TABS: 4.0000 mg | ORAL_TABLET | Freq: Four times a day (QID) | ORAL | Status: DC | PRN

## 2020-11-19 MED ORDER — ACETAMINOPHEN 325 MG PO TABS: 650.0000 mg | ORAL_TABLET | ORAL | Status: DC | PRN

## 2020-11-19 MED ORDER — ACETAMINOPHEN 10 MG/ML IV SOLN
INTRAVENOUS | Status: DC | PRN
  Administered 2020-11-19: 1000 mg via INTRAVENOUS

## 2020-11-19 MED ORDER — DEXAMETHASONE SODIUM PHOSPHATE 10 MG/ML IJ SOLN
INTRAMUSCULAR | Status: DC | PRN
  Administered 2020-11-19: 10 mg via INTRAVENOUS

## 2020-11-19 MED ORDER — ROCURONIUM BROMIDE 10 MG/ML (PF) SYRINGE
PREFILLED_SYRINGE | INTRAVENOUS | Status: AC
  Filled 2020-11-19: qty 20

## 2020-11-19 MED ORDER — PROPOFOL 1000 MG/100ML IV EMUL
INTRAVENOUS | Status: AC
  Filled 2020-11-19: qty 200

## 2020-11-19 MED ORDER — CEFAZOLIN SODIUM-DEXTROSE 2-4 GM/100ML-% IV SOLN
2.0000 g | INTRAVENOUS | Status: AC
  Administered 2020-11-19: 2 g via INTRAVENOUS
  Filled 2020-11-19: qty 100

## 2020-11-19 MED ORDER — THROMBIN 5000 UNITS EX SOLR
CUTANEOUS | Status: AC
  Filled 2020-11-19: qty 5000

## 2020-11-19 MED ORDER — TRIAMCINOLONE ACETONIDE 40 MG/ML IJ SUSP
INTRAMUSCULAR | Status: AC
  Filled 2020-11-19: qty 5

## 2020-11-19 MED ORDER — FENTANYL CITRATE (PF) 100 MCG/2ML IJ SOLN
25.0000 ug | INTRAMUSCULAR | Status: DC | PRN
  Administered 2020-11-19 (×2): 50 ug via INTRAVENOUS

## 2020-11-19 SURGICAL SUPPLY — 65 items
APL SKNCLS STERI-STRIP NONHPOA (GAUZE/BANDAGES/DRESSINGS)
BAND INSRT 18 STRL LF DISP RB (MISCELLANEOUS) ×2
BAND RUBBER #18 3X1/16 STRL (MISCELLANEOUS) ×4 IMPLANT
BENZOIN TINCTURE PRP APPL 2/3 (GAUZE/BANDAGES/DRESSINGS) IMPLANT
BIT DRILL NEURO 2X3.1 SFT TUCH (MISCELLANEOUS) ×1 IMPLANT
BLADE CLIPPER SURG (BLADE) IMPLANT
BUR CARBIDE MATCH 3.0 (BURR) IMPLANT
CAGE CERV NANOLOCK LG 7 6D (Spacer) ×2 IMPLANT
CANISTER SUCT 3000ML PPV (MISCELLANEOUS) ×2 IMPLANT
CARTRIDGE OIL MAESTRO DRILL (MISCELLANEOUS) ×1 IMPLANT
CLSR STERI-STRIP ANTIMIC 1/2X4 (GAUZE/BANDAGES/DRESSINGS) ×2 IMPLANT
COVER MAYO STAND STRL (DRAPES) ×2 IMPLANT
COVER WAND RF STERILE (DRAPES) IMPLANT
DEVICE ENDSKLTN TC IMPLANT 8MM (Spacer) ×1 IMPLANT
DIFFUSER DRILL AIR PNEUMATIC (MISCELLANEOUS) ×2 IMPLANT
DRAPE C-ARM 42X72 X-RAY (DRAPES) ×2 IMPLANT
DRAPE HALF SHEET 40X57 (DRAPES) ×2 IMPLANT
DRAPE LAPAROTOMY 100X72X124 (DRAPES) ×2 IMPLANT
DRAPE MICROSCOPE LEICA (MISCELLANEOUS) ×2 IMPLANT
DRILL NEURO 2X3.1 SOFT TOUCH (MISCELLANEOUS) ×2
DRSG OPSITE POSTOP 4X6 (GAUZE/BANDAGES/DRESSINGS) ×2 IMPLANT
DURAPREP 6ML APPLICATOR 50/CS (WOUND CARE) ×2 IMPLANT
ELECT COATED BLADE 2.86 ST (ELECTRODE) ×2 IMPLANT
ELECT REM PT RETURN 9FT ADLT (ELECTROSURGICAL) ×2
ELECTRODE REM PT RTRN 9FT ADLT (ELECTROSURGICAL) ×1 IMPLANT
ENDOSKELETON TC IMPLANT 8MM (Spacer) ×2 IMPLANT
GAUZE 4X4 16PLY RFD (DISPOSABLE) IMPLANT
GLOVE BIOGEL PI IND STRL 8 (GLOVE) IMPLANT
GLOVE BIOGEL PI INDICATOR 8 (GLOVE)
GLOVE ECLIPSE 8.0 STRL XLNG CF (GLOVE) ×2 IMPLANT
GLOVE EXAM NITRILE LRG STRL (GLOVE) IMPLANT
GLOVE EXAM NITRILE XL STR (GLOVE) IMPLANT
GLOVE EXAM NITRILE XS STR PU (GLOVE) IMPLANT
GLOVE SRG 8 PF TXTR STRL LF DI (GLOVE) ×1 IMPLANT
GLOVE SURG SS PI 6.0 STRL IVOR (GLOVE) ×8 IMPLANT
GLOVE SURG UNDER POLY LF SZ6.5 (GLOVE) ×2 IMPLANT
GLOVE SURG UNDER POLY LF SZ8 (GLOVE) ×2
GOWN STRL REUS W/ TWL LRG LVL3 (GOWN DISPOSABLE) ×1 IMPLANT
GOWN STRL REUS W/ TWL XL LVL3 (GOWN DISPOSABLE) ×1 IMPLANT
GOWN STRL REUS W/TWL 2XL LVL3 (GOWN DISPOSABLE) IMPLANT
GOWN STRL REUS W/TWL LRG LVL3 (GOWN DISPOSABLE) ×2
GOWN STRL REUS W/TWL XL LVL3 (GOWN DISPOSABLE) ×2
HEMOSTAT POWDER KIT SURGIFOAM (HEMOSTASIS) ×2 IMPLANT
KIT BASIN OR (CUSTOM PROCEDURE TRAY) ×2 IMPLANT
KIT TURNOVER KIT B (KITS) ×2 IMPLANT
MODULE EMG NEEDLE SSEP NVM5 (NEEDLE) ×2 IMPLANT
NEEDLE HYPO 22GX1.5 SAFETY (NEEDLE) ×2 IMPLANT
NEEDLE SPNL 18GX3.5 QUINCKE PK (NEEDLE) ×2 IMPLANT
NS IRRIG 1000ML POUR BTL (IV SOLUTION) ×2 IMPLANT
OIL CARTRIDGE MAESTRO DRILL (MISCELLANEOUS) ×2
PACK LAMINECTOMY NEURO (CUSTOM PROCEDURE TRAY) ×2 IMPLANT
PAD ARMBOARD 7.5X6 YLW CONV (MISCELLANEOUS) ×6 IMPLANT
PIN DISTRACTION 14MM (PIN) ×4 IMPLANT
PLATE ZEVO 2LVL 37MM (Plate) ×2 IMPLANT
PUTTY BONE DBX 2.5 MIS (Bone Implant) ×2 IMPLANT
SCREW ANT CERV SD ZEVO 3.5X18 (Screw) ×12 IMPLANT
SPONGE INTESTINAL PEANUT (DISPOSABLE) ×2 IMPLANT
SPONGE SURGIFOAM ABS GEL SZ50 (HEMOSTASIS) ×2 IMPLANT
STAPLER VISISTAT 35W (STAPLE) ×2 IMPLANT
STRIP CLOSURE SKIN 1/2X4 (GAUZE/BANDAGES/DRESSINGS) ×2 IMPLANT
TAPE SURG TRANSPORE 1 IN (GAUZE/BANDAGES/DRESSINGS) IMPLANT
TAPE SURGICAL TRANSPORE 1 IN (GAUZE/BANDAGES/DRESSINGS)
TOWEL GREEN STERILE (TOWEL DISPOSABLE) ×2 IMPLANT
TOWEL GREEN STERILE FF (TOWEL DISPOSABLE) ×2 IMPLANT
WATER STERILE IRR 1000ML POUR (IV SOLUTION) ×2 IMPLANT

## 2020-11-19 NOTE — Progress Notes (Signed)
   Providing Compassionate, Quality Care - Together  NEUROSURGERY PROGRESS NOTE   S: pt s/e in room, no new complaints, tolerating diet  O: EXAM:  BP 139/83 (BP Location: Right Arm)   Pulse (!) 102   Temp 98.2 F (36.8 C) (Oral)   Resp 18   Ht 6\' 2"  (1.88 m)   Wt 89.8 kg   SpO2 100%   BMI 25.42 kg/m   Awake, alert, oriented  Speech fluent, appropriate  Neck soft, trachea midline, nml phonation Incision c/d/i, hmv in place 5/5 BUE RLE 3/5 LLE 2/5  ASSESSMENT:  55 y.o. male with   1. C5-7 cord compression with myelopathy  S/p ACDF C5-7 on 11/19/2020  PLAN: - pt/ot - pain control - collar - mon hmv - possible rehab    Thank you for allowing me to participate in this patient's care.  Please do not hesitate to call with questions or concerns.   01/17/2021, DO Neurosurgeon [facility] Cell: [phone]

## 2020-11-19 NOTE — H&P (Signed)
Providing Compassionate, Quality Care - Together  NEUROSURGERY HISTORY & PHYSICAL   Wayne Mckinney is an 55 y.o. male.   Chief Complaint: Bilateral lower extremity weakness HPI: This is a 55 year old male with a history of hypertension and blindness that presented to my office with complaints of lower extremity weakness, numbness and tingling as well as balance issues progressively worsening over the past 3 to 4 weeks. He saw his podiatrist and was concerned as his numbness/tingling in his feet and worsened which has been ongoing for greater than 1 year. He previously was not using any assistive device for ambulation and at this point he is now requiring a 4 wheeled walker. Stat imaging work-up revealed severe cord compression at C6-7 and moderate cord compression at C5-6 with cord signal change. I urgently added him on for surgery this week given his progressive symptoms and MRI findings. He denies any numbness, tingling or weakness in the hands or arms. He denies any neck pain.   Past Medical History:  Diagnosis Date  . Hypertension   . Legally blind     Past Surgical History:  Procedure Laterality Date  . NO PAST SURGERIES      History reviewed. No pertinent family history. Social History:  reports that he has quit smoking. He has quit using smokeless tobacco. He reports current alcohol use of about 2.0 - 3.0 standard drinks of alcohol per week. He reports that he does not use drugs.  Allergies: No Known Allergies  Medications Prior to Admission  Medication Sig Dispense Refill  . amLODipine (NORVASC) 10 MG tablet Take 10 mg by mouth daily.    . hydrochlorothiazide (MICROZIDE) 12.5 MG capsule Take 12.5 mg by mouth daily.      Results for orders placed or performed during the hospital encounter of 11/19/20 (from the past 48 hour(s))  Basic metabolic panel     Status: None   Collection Time: 11/19/20  9:27 AM  Result Value Ref Range   Sodium 139 135 - 145 mmol/L   Potassium  3.7 3.5 - 5.1 mmol/L   Chloride 103 98 - 111 mmol/L   CO2 27 22 - 32 mmol/L   Glucose, Bld 95 70 - 99 mg/dL    Comment: Glucose reference range applies only to samples taken after fasting for at least 8 hours.   BUN 18 6 - 20 mg/dL   Creatinine, Ser 2.99 0.61 - 1.24 mg/dL   Calcium 9.6 8.9 - 24.2 mg/dL   GFR, Estimated >68 >34 mL/min    Comment: (NOTE) Calculated using the CKD-EPI Creatinine Equation (2021)    Anion gap 9 5 - 15    Comment: Performed at [facility], [address], [place], [place] [address]  CBC WITH DIFFERENTIAL     Status: None   Collection Time: 11/19/20  9:27 AM  Result Value Ref Range   WBC 5.0 4.0 - 10.5 K/uL   RBC 5.34 4.22 - 5.81 MIL/uL   Hemoglobin 15.5 13.0 - 17.0 g/dL   HCT 29.7 98.9 - 21.1 %   MCV 88.2 80.0 - 100.0 fL   MCH 29.0 26.0 - 34.0 pg   MCHC 32.9 30.0 - 36.0 g/dL   RDW 94.1 74.0 - 81.4 %   Platelets 222 150 - 400 K/uL   nRBC 0.0 0.0 - 0.2 %   Neutrophils Relative % 60 %   Neutro Abs 3.0 1.7 - 7.7 K/uL   Lymphocytes Relative 30 %   Lymphs Abs 1.5  0.7 - 4.0 K/uL   Monocytes Relative 8 %   Monocytes Absolute 0.4 0.1 - 1.0 K/uL   Eosinophils Relative 1 %   Eosinophils Absolute 0.0 0.0 - 0.5 K/uL   Basophils Relative 1 %   Basophils Absolute 0.0 0.0 - 0.1 K/uL   Immature Granulocytes 0 %   Abs Immature Granulocytes 0.01 0.00 - 0.07 K/uL    Comment: Performed at [facility], [address] [place], [place] [address]  Protime-INR     Status: None   Collection Time: 11/19/20  9:27 AM  Result Value Ref Range   Prothrombin Time 12.6 11.4 - 15.2 seconds   INR 1.0 0.8 - 1.2    Comment: (NOTE) INR goal varies based on device and disease states. Performed at [facility], [address], [place], [place] [address]   Surgical pcr screen     Status: None   Collection Time: 11/19/20  9:30 AM   Specimen: Nasal Mucosa; Nasal Swab  Result Value Ref Range   MRSA, PCR NEGATIVE NEGATIVE   Staphylococcus aureus NEGATIVE  NEGATIVE    Comment: (NOTE) The Xpert SA Assay (FDA approved for NASAL specimens in patients 54 years of age and older), is one component of a comprehensive surveillance program. It is not intended to diagnose infection nor to guide or monitor treatment. Performed at [facility], [address], [place], [place] [address]   Type and screen     Status: None   Collection Time: 11/19/20 10:05 AM  Result Value Ref Range   ABO/RH(D) A POS    Antibody Screen NEG    Sample Expiration      11/22/2020,2359 Performed at [facility], [address] [place], [place] [address]   ABO/Rh     Status: None   Collection Time: 11/19/20 10:10 AM  Result Value Ref Range   ABO/RH(D)      A POS Performed at [facility], [address] [place], [place] [address]    No results found.  ROS 14 point review of systems was obtained which all pertinent positives and negatives are listed in HPI above  Blood pressure (!) 159/91, pulse 69, temperature (!) 97.4 F (36.3 C), temperature source Oral, resp. rate 18, height 6\' 2"  (1.88 m), weight 89.8 kg, SpO2 100 %. Physical Exam  Awake alert oriented x3 Legally blind Follows commands x4, bilateral upper extremities 5/5 Slight Hoffmann's bilateral upper extremities Right lower extremity 3/5 throughout Left lower extremity 2/5 throughout BLE Deep tendon reflexes (patellar/Achilles 3/5, no clonus) Numbness to light touch in bilateral lower extremities  Assessment/Plan 55 year old male with  1. Severe cervical myelopathy with cord compression and cord signal change, C5-7  -OR today for ACDF C5-7 -All risks, benefits and alternatives discussed extensively with the patient. He understands his expected recovery and the natural pathophysiology of myelopathy. I explained expected outcomes. I also discussed with him the possibility of needing a posterior surgical approach given he has slight ligamentous hypertrophy at C6-7 posteriorly  however we will start with an anterior approach and follow his recovery.    Thank you for allowing me to participate in this patient's care.  Please do not hesitate to call with questions or concerns.   57, DO Neurosurgeon [facility] Cell: [phone]

## 2020-11-19 NOTE — Transfer of Care (Signed)
Immediate Anesthesia Transfer of Care Note  Patient: Kris No  Procedure(s) Performed: ANTERIOR CERVICAL DECOMPRESSION FUSION CERVICAL FIVE-SIX, CERVICAL SIX-SEVEN (N/A Spine Cervical)  Patient Location: PACU  Anesthesia Type:General  Level of Consciousness: awake, alert  and oriented  Airway & Oxygen Therapy: Patient Spontanous Breathing and Patient connected to face mask oxygen  Post-op Assessment: Report given to RN and Post -op Vital signs reviewed and stable  Post vital signs: Reviewed and stable  Last Vitals:  Vitals Value Taken Time  BP 131/78 11/19/20 1645  Temp 36.4 C 11/19/20 1645  Pulse 70 11/19/20 1648  Resp 17 11/19/20 1648  SpO2 100 % 11/19/20 1648  Vitals shown include unvalidated device data.  Last Pain:  Vitals:   11/19/20 1005  TempSrc:   PainSc: 0-No pain      Patients Stated Pain Goal: 5 (11/19/20 1005)  Complications: No complications documented.

## 2020-11-19 NOTE — Anesthesia Procedure Notes (Addendum)
Procedure Name: Intubation Date/Time: 11/19/2020 1:12 PM Performed by: Waunita Schooner, RN Pre-anesthesia Checklist: Patient identified, Emergency Drugs available, Suction available and Patient being monitored Patient Re-evaluated:Patient Re-evaluated prior to induction Oxygen Delivery Method: Circle system utilized Preoxygenation: Pre-oxygenation with 100% oxygen Induction Type: IV induction Ventilation: Mask ventilation without difficulty Laryngoscope Size: Glidescope and 4 Grade View: Grade I Tube type: Oral Tube size: 7.5 mm Number of attempts: 1 Airway Equipment and Method: Stylet Placement Confirmation: ETT inserted through vocal cords under direct vision,  positive ETCO2 and breath sounds checked- equal and bilateral Secured at: 23 cm Tube secured with: Tape Dental Injury: Teeth and Oropharynx as per pre-operative assessment  Difficulty Due To: Difficult Airway-  due to neck instability and Difficult Airway- due to dentition

## 2020-11-19 NOTE — Op Note (Signed)
Date of service: 11/19/2020  PREOP DIAGNOSIS: C5-7 Cervical spondylosis with myelopathy, severe cord compression, cord signal change and severe lower extremity weakness  POSTOP DIAGNOSIS: Same  PROCEDURE: 1. Arthrodesis C5-6, C6-7, anterior interbody technique  2. Placement of intervertebral biomechanical device C5-6, C6-7, medtronic titan 3. Placement of anterior instrumentation consisting of interbody plate and screws - C5-6, C6-7, Medtronic zevo plate, 37 mm 4. Discectomy at C5-6, C6-7 for decompression of spinal cord and exiting nerve roots  5. Use of morselized bone allograft, DBX 6. Use of intraoperative microscope 7.  Intraoperative use of neuro monitoring, SSEPs, greater than 1 hour  SURGEON: Dr. Kendell Bane Elowen Debruyn, DO  ASSISTANT: None  ANESTHESIA: General Endotracheal  EBL: 50cc  SPECIMENS: None  DRAINS: Medium hemovac  COMPLICATIONS: None immediate  CONDITION: Hemodynamically stable to PACU  HISTORY: Wayne Mckinney is a 55 y.o. y.o. male who initially presented to the outpatient clinic with signs and symptoms consistent with severe myelopathy.  For the past 3 to 4 weeks he had had worsening numbness and tingling in his feet as well as weakness and difficulty walking, multiple falls at home and his podiatrist referred him for evaluation. MRI of the cervical spine revealed severe cord compression at C6-7 with cord signal change, moderate to severe stenosis at C5-6. Treatment options were discussed including anterior versus posterior decompression and fusion, observation management, and anterior cervical discectomy and fusion, which I recommended. After all questions were answered, informed consent was obtained.  PROCEDURE IN DETAIL: The patient was brought to the operating room and transferred to the operative table. After induction of general anesthesia, the patient was positioned on the operative table in the supine position with all pressure points meticulously padded.   Prepositioning SSEPs were obtained, his bilateral lower extremities were slightly present and difficult to monitor.  His neck was placed in gentle extension and his post positioning SSEPs were noted to be stable.  The skin of the neck was then prepped and draped in the usual sterile fashion.  After timeout was conducted, the skin incision was then made sharply and Bovie electrocautery was used to dissect the subcutaneous tissue until the platysma was identified. The platysma was then divided and undermined. The sternocleidomastoid muscle was then identified and, utilizing natural fascial planes in the neck, the prevertebral fascia was identified and the carotid sheath was retracted laterally and the trachea and esophagus retracted medially. Again using fluoroscopy, the C6/7 disc space was identified. Bovie electrocautery was used to dissect in the subperiosteal plane and elevate the bilateral longus coli muscles C5, C6 and C7. Self-retaining retractors were then placed under the longus coli muscles at C6/7. At this point, the microscope was draped and brought into the field, and the remainder of the case was done under the microscope using microdissecting technique.  Caspar pins were placed in midline in the C6 and C7 vertebral bodies.  The disc space was incised sharply and rongeurs were use to initially complete a discectomy.  The disc space was placed under distraction.  The high-speed drill was then used to complete discectomy until the posterior annulus was identified and removed and the posterior longitudinal ligament was identified.  Using the high-speed drill, posterior osteophytes were removed along the inferior C6 vertebral body and superior C7 vertebral body.  Using a micro curettes, the PLL was elevated, and Kerrison rongeurs were used to remove the posterior longitudinal ligament and the ventral thecal sac was identified.  There was a large central and inferior herniated nucleus pulposus.  Using a  combination of curettes and ronguers, complete decompression of the thecal sac and exiting nerve roots at this level was completed, and verified using micro-nerve hook.  The disc space was taken out of distraction.  Hemostasis was achieved with Surgiflo.  Having completed our decompression, attention was turned to placement of the intervertebral device. Trial spacers were used to select a 8 mm x 18 x 16 mm graft. This graft was then filled with morcellized allograft, and inserted under live fluoroscopy.  The Caspar pin was removed from C7 vertebral body and placed in the midline of C5 vertebral body.  Hemostasis was achieved with Surgiflo.  The self-retaining retractors were let down and removed to be centered over the C5-6 interspace under the longus coli bilaterally.  The disc space was incised sharply and rongeurs were use to initially complete a discectomy.  The disc space was placed under distraction.  The high-speed drill was then used to complete discectomy until the posterior annulus was identified and removed and the posterior longitudinal ligament was identified.  Using the high-speed drill, posterior osteophytes were removed along the inferior C5 vertebral body and superior C6 vertebral body.  Using a micro curettes, the PLL was elevated, and Kerrison rongeurs were used to remove the posterior longitudinal ligament and the ventral thecal sac was identified. Using a combination of curettes and ronguers, complete decompression of the thecal sac and exiting nerve roots at this level was completed, and verified using micro-nerve hook.  The disc space was taken out of distraction.  Hemostasis was achieved with Surgiflo.  Having completed the decompression, attention was turned to placement of the intervertebral device. Trial spacers were used to select a 7 mm x 18 x 16 mm graft. This graft was then filled with morcellized allograft, and inserted under live fluoroscopy.  The Caspar pin was removed from C  5, 6 vertebral bodies. Hemostasis was achieved with Surgiflo.    After placement of the intervertebral devices, the above anterior cervical plate was selected, and placed across the interspace. Using a high-speed drill, the cortex of the C5, C6, C7 vertebral bodies was punctured, and screws inserted. Final lateral fluoroscopic images were taken to confirm good hardware placement.  At this point, after all counts were verified to be correct, meticulous hemostasis was secured using a combination of bipolar electrocautery and passive hemostatics.  Kenalog was placed along the lateral esophageal wall.  A medium Hemovac was tunneled laterally and placed in the prevertebral space.  Again the wound was explored and noted to be excellently hemostatic.  The skin was then closed with staples.  Sterile dressing was applied.  The drapes were taken down.  The patient tolerated the procedure well and was extubated in the room and taken to the postanesthesia care unit in stable condition.  SSEP neuro monitoring was stable the entirety of the case.

## 2020-11-20 ENCOUNTER — Encounter (HOSPITAL_COMMUNITY): Payer: Self-pay | Admitting: Neurological Surgery

## 2020-11-20 LAB — CBC
HCT: 44.6 % (ref 39.0–52.0)
Hemoglobin: 14.9 g/dL (ref 13.0–17.0)
MCH: 29 pg (ref 26.0–34.0)
MCHC: 33.4 g/dL (ref 30.0–36.0)
MCV: 86.8 fL (ref 80.0–100.0)
Platelets: 253 10*3/uL (ref 150–400)
RBC: 5.14 MIL/uL (ref 4.22–5.81)
RDW: 12.6 % (ref 11.5–15.5)
WBC: 8.8 10*3/uL (ref 4.0–10.5)
nRBC: 0 % (ref 0.0–0.2)

## 2020-11-20 LAB — BASIC METABOLIC PANEL
Anion gap: 10 (ref 5–15)
BUN: 17 mg/dL (ref 6–20)
CO2: 27 mmol/L (ref 22–32)
Calcium: 9.1 mg/dL (ref 8.9–10.3)
Chloride: 98 mmol/L (ref 98–111)
Creatinine, Ser: 1.1 mg/dL (ref 0.61–1.24)
GFR, Estimated: >60 mL/min (ref >60)
Glucose, Bld: 137 mg/dL — ABNORMAL HIGH (ref 70–99)
Potassium: 3.6 mmol/L (ref 3.5–5.1)
Sodium: 135 mmol/L (ref 135–145)

## 2020-11-20 MED ORDER — HEPARIN SODIUM (PORCINE) 5000 UNIT/ML IJ SOLN
5000.0000 [IU] | Freq: Two times a day (BID) | INTRAMUSCULAR | Status: DC
  Administered 2020-11-20 – 2020-11-21 (×3): 5000 [IU] via SUBCUTANEOUS
  Filled 2020-11-20 (×4): qty 1

## 2020-11-20 NOTE — Progress Notes (Signed)
Inpatient Rehab Admissions:  Inpatient Rehab Consult received.  I met with patient at the bedside for rehabilitation assessment and to discuss goals and expectations of an inpatient rehab admission.  We discussed average length of stay on CIR to be about 2 weeks.  Typically we recommend pt's have 24/7 supervision at discharge from [facility], but pt's wife works.  Will go ahead and start insurance process today, but will need to speak with wife to confirm amount of help patient has available to him after a CIR stay to confirm his candidacy.  Will also discuss with rehab MDs to make sure we feel we can get pt to a safe level to discharge home, as his insurance will not cover CIR followed by SNF for rehab.    Signed: Shann Medal, PT, DPT Admissions Coordinator [phone] 11/20/20  4:40 PM

## 2020-11-20 NOTE — NC FL2 (Signed)
[place] MEDICAID FL2 LEVEL OF CARE SCREENING TOOL     IDENTIFICATION  Patient Name: Wayne Mckinney Birthdate: [birth year] Sex: male Admission Date (Current Location): 11/19/2020  [facility] and [place] Number:  Producer, television/film/video and Address:  [facility], [address], [place], [place] [address]      Provider Number: [id number]  Attending Physician Name and Address:  Dawley, Alan Mulder, DO  Relative Name and Phone Number:  French Ana, spouse, [phone]    Current Level of Care: Hospital Recommended Level of Care: Skilled Nursing Facility Prior Approval Number:    Date Approved/Denied:   PASRR Number: [id number]  Discharge Plan: SNF    Current Diagnoses: Patient Active Problem List   Diagnosis Date Noted  . Cervical myelopathy (HCC) 11/19/2020    Orientation RESPIRATION BLADDER Height & Weight     Self,Time,Situation,Place  Normal Continent Weight: 198 lb (89.8 kg) Height:  6\' 2"  (188 cm)  BEHAVIORAL SYMPTOMS/MOOD NEUROLOGICAL BOWEL NUTRITION STATUS      Continent Diet (Please see DC Summary-Regular)  AMBULATORY STATUS COMMUNICATION OF NEEDS Skin   Limited Assist Verbally Surgical wounds (Closed incision on neck;)                       Personal Care Assistance Level of Assistance  Bathing,Feeding,Dressing Bathing Assistance: Limited assistance Feeding assistance: Limited assistance Dressing Assistance: Limited assistance     Functional Limitations Info  Sight Sight Info: Impaired (Blind)        SPECIAL CARE FACTORS FREQUENCY  PT (By licensed PT),OT (By licensed OT)     PT Frequency: 5x/week OT Frequency: 5x/week            Contractures Contractures Info: Not present    Additional Factors Info  Code Status,Allergies Code Status Info: Full Allergies Info: NKA           Current Medications (11/20/2020):  This is the current hospital active medication list Current Facility-Administered Medications  Medication  Dose Route Frequency Provider Last Rate Last Admin  . 0.9 %  sodium chloride infusion   Intravenous Continuous Dawley, Troy C, DO 75 mL/hr at 11/19/20 1848 New Bag at 11/19/20 1848  . acetaminophen (TYLENOL) tablet 650 mg  650 mg Oral Q4H PRN Dawley, Troy C, DO       Or  . acetaminophen (TYLENOL) suppository 650 mg  650 mg Rectal Q4H PRN Dawley, Troy C, DO      . alum & mag hydroxide-simeth (MAALOX/MYLANTA) 200-200-20 MG/5ML suspension 30 mL  30 mL Oral Q6H PRN Dawley, Troy C, DO      . amLODipine (NORVASC) tablet 10 mg  10 mg Oral Daily Dawley, Troy C, DO      . heparin injection 5,000 Units  5,000 Units Subcutaneous Q12H Dawley, Troy C, DO      . hydrochlorothiazide (MICROZIDE) capsule 12.5 mg  12.5 mg Oral Daily Dawley, Troy C, DO   12.5 mg at 11/19/20 1916  . HYDROcodone-acetaminophen (NORCO/VICODIN) 5-325 MG per tablet 1 tablet  1 tablet Oral Q4H PRN Dawley, Troy C, DO   1 tablet at 11/20/20 0201  . HYDROmorphone (DILAUDID) injection 0.5 mg  0.5 mg Intravenous Q3H PRN Dawley, Troy C, DO      . menthol-cetylpyridinium (CEPACOL) lozenge 3 mg  1 lozenge Oral PRN Dawley, Troy C, DO       Or  . phenol (CHLORASEPTIC) mouth spray 1 spray  1 spray Mouth/Throat PRN Dawley, Troy C, DO      .  methocarbamol (ROBAXIN) tablet 500 mg  500 mg Oral Q6H PRN Dawley, Troy C, DO   500 mg at 11/20/20 0201   Or  . methocarbamol (ROBAXIN) 500 mg in dextrose 5 % 50 mL IVPB  500 mg Intravenous Q6H PRN Dawley, Troy C, DO      . ondansetron (ZOFRAN) tablet 4 mg  4 mg Oral Q6H PRN Dawley, Troy C, DO       Or  . ondansetron (ZOFRAN) injection 4 mg  4 mg Intravenous Q6H PRN Dawley, Troy C, DO      . senna-docusate (Senokot-S) tablet 1 tablet  1 tablet Oral QHS PRN Dawley, Alan Mulder, DO         Discharge Medications: Please see discharge summary for a list of discharge medications.  Relevant Imaging Results:  Relevant Lab Results:   Additional Information SSN: [id number]. Pfizer COVID-19 Vaccine 02/26/2020 ,  01/29/2020  Mearl Latin, LCSW

## 2020-11-20 NOTE — Progress Notes (Signed)
   Providing Compassionate, Quality Care - Together  NEUROSURGERY PROGRESS NOTE   S: No issues overnight. Tolerating diet, states has some improvement in numbness/strength in LEs  O: EXAM:  BP 127/78 (BP Location: Right Arm)   Pulse 70   Temp (!) 97.5 F (36.4 C) (Oral)   Resp 18   Ht 6\' 2"  (1.88 m)   Wt 89.8 kg   SpO2 99%   BMI 25.42 kg/m   Awake, alert, oriented  Speech fluent, appropriate  Neck soft, trachea midline, nml phonation Incision c/d/i, hmv in place 5/5 BUE RLE 3/5 LLE 3/5  ASSESSMENT:  55 y.o. male with   1. C5-7 cord compression with myelopathy  S/p ACDF C5-7 on 11/19/2020  PLAN: - pt/ot - pain control - collar - dc hmv - rehab likely  Thank you for allowing me to participate in this patient's care.  Please do not hesitate to call with questions or concerns.   01/17/2021, DO Neurosurgeon [facility] Cell: [phone]

## 2020-11-20 NOTE — Progress Notes (Signed)
OT EVALUATION  11/19/20 1008  OT Visit Information  Last OT Received On 11/20/20  Assistance Needed +2  History of Present Illness 55 yo male cord compression s/p ACDF C5-7 on 11/20/19 PMH former smoker HTN legally blind requires  Precautions  Precautions Cervical;Fall  Required Braces or Orthoses Cervical Brace  Cervical Brace At all times;Hard collar  Home Living  Family/patient expects to be discharged to: Private residence  Living Arrangements Spouse/significant other  Available Help at Discharge Family;Available PRN/intermittently  Type of Home Apartment  Home Access Stairs to enter  Entrance Stairs-Number of Steps 14  Entrance Stairs-Rails Left  Bathroom Shower/Tub Tub/shower unit  Bathroom Toilet Standard  Home Equipment Walker - standard  Additional Comments Last Filed Values (24 hours)    pt has been sponge bathing with tub since 12/26. pt has an Surveyor, quantity walker/ friend let him borrow a wheelchair. pt reports he wants a walker for 500 pound person so its more sturdy  Prior Function  Level of Independence Independent  Communication  Communication No difficulties  Pain Assessment  Pain Assessment No/denies pain  Cognition  Arousal/Alertness Awake/alert  Behavior During Therapy WFL for tasks assessed/performed  Overall Cognitive Status Within Functional Limits for tasks assessed  Upper Extremity Assessment  Upper Extremity Assessment Overall WFL for tasks assessed  Lower Extremity Assessment  RLE Deficits / Details ataxic movemtn  LLE Deficits / Details pain at the knee and ataxic  Cervical / Trunk Assessment  Cervical / Trunk Assessment Other exceptions (s/p surgery)  ADL  Overall ADL's  Needs assistance/impaired  Eating/Feeding Set up  Grooming Minimal assistance;Bed level  Upper Body Bathing Minimal assistance;Bed level  Lower Body Bathing Maximal assistance  Upper Body Dressing Details (indicate cue type and reason) total (A) with brace   General ADL Comments pt completed sit<>Stand from bed surface for the first time with max v/c  Vision- History  Baseline Vision/History Legally blind  Patient Visual Report Other (comment)  Vision- Assessment  Additional Comments does not have any sight. does not see even shadows. blind since 2007  Bed Mobility  Overal bed mobility Needs Assistance  Bed Mobility Rolling;Supine to Sit;Sit to Supine  Rolling Mod assist  Supine to sit Mod assist  Sit to supine Max assist  General bed mobility comments pt requires max v/c and education on cervical precautions to log roll and sit. Pt requires (A) to progress bil LE on and off the bed surface  Transfers  Overall transfer level Needs assistance  Equipment used Rolling walker (2 wheeled)  Transfers Sit to/from Stand  Sit to Stand Mod assist  General transfer comment elevated bed surface to help with elevation from bed. pt ataxic movement  Balance  Overall balance assessment Needs assistance  General Comments  General comments (skin integrity, edema, etc.) x4 falls since 12/26  OT - End of Session  Equipment Utilized During Treatment Gait belt;Rolling walker;Cervical collar  Activity Tolerance Patient tolerated treatment well  Patient left in bed;with call bell/phone within reach  Nurse Communication Mobility status;Precautions  OT Assessment  OT Recommendation/Assessment Patient needs continued OT Services  OT Visit Diagnosis Unsteadiness on feet (R26.81);Muscle weakness (generalized) (M62.81)  OT Problem List Decreased strength;Decreased activity tolerance;Impaired balance (sitting and/or standing);Decreased safety awareness;Decreased knowledge of use of DME or AE;Decreased knowledge of precautions;Impaired sensation  OT Plan  OT Frequency (ACUTE ONLY) Min 3X/week  OT Treatment/Interventions (ACUTE ONLY) Self-care/ADL training;Therapeutic exercise;Neuromuscular education;Energy conservation;DME and/or AE instruction;Manual  therapy;Therapeutic activities;Patient/family education;Balance training  AM-PAC OT "6  Clicks" Daily Activity Outcome Measure (Version 2)  Help from another person eating meals? 3  Help from another person taking care of personal grooming? 3  Help from another person toileting, which includes using toliet, bedpan, or urinal? 2  Help from another person bathing (including washing, rinsing, drying)? 2  Help from another person to put on and taking off regular upper body clothing? 2  Help from another person to put on and taking off regular lower body clothing? 2  6 Click Score 14  OT Recommendation  Recommendations for Other Services Rehab consult  Follow Up Recommendations CIR  OT Equipment 3 in 1 bedside commode;Other (comment) (RW)  Individuals Consulted  Consulted and Agree with Results and Recommendations Patient  Acute Rehab OT Goals  Patient Stated Goal to get an aide for home  OT Goal Formulation With patient  Time For Goal Achievement 12/04/20  Potential to Achieve Goals Good  OT Time Calculation  OT Start Time (ACUTE ONLY) 0551  OT Stop Time (ACUTE ONLY) 0354  OT Time Calculation (min) 36 min  OT General Charges  $OT Visit 1 Visit  OT Evaluation  $OT Eval Moderate Complexity 1 Mod  OT Treatments  $Self Care/Home Management  8-22 mins  Written Expression  Dominant Hand Right     Brynn, OTR/L  Acute Rehabilitation Services Pager: [phone] Office: [phone] .

## 2020-11-20 NOTE — Evaluation (Signed)
Physical Therapy Evaluation Patient Details Name: Wayne Mckinney MRN: [id number] DOB: 12-May-1966 Today's Date: 11/20/2020   History of Present Illness  Pt is a 55 y/o male with cord compression who is now s/p ACDF C5-7 on 11/19/20. PMH HTN legally blind  Clinical Impression  Pt admitted with above diagnosis. At the time of PT eval, pt was able to demonstrate transfers and ambulation with up to mod assist and RW for support. Chair follow utilized for hallway ambulation. Pt reports several falls PTA. Based on performance today, feel this patient would be a good CIR candidate. He is demonstrating some ataxia with OOB mobility, and is overall motivated to progress and work with therapy. Feel he would thrive in the high intensity CIR environment. Pt was educated on precautions, brace application/wearing schedule, and appropriate activity progression. Pt currently with functional limitations due to the deficits listed below (see PT Problem List). Pt will benefit from skilled PT to increase their independence and safety with mobility to allow discharge to the venue listed below.      Follow Up Recommendations CIR;Supervision for mobility/OOB    Equipment Recommendations  Rolling walker with 5" wheels    Recommendations for Other Services Rehab consult     Precautions / Restrictions Precautions Precautions: Fall;Cervical Precaution Comments: Verbally reviewed precautions due to blindness. Required Braces or Orthoses: Cervical Brace Cervical Brace: Hard collar;At all times Restrictions Weight Bearing Restrictions: No      Mobility  Bed Mobility Overal bed mobility: Needs Assistance Bed Mobility: Rolling;Sidelying to Sit Rolling: Min guard Sidelying to sit: Min assist       General bed mobility comments: HOB elevated. Max VC's and hand-over-hand assist to reach for bed rails. Pt required increased time and effort to get LE's off EOB and elevate trunk to full sitting position.     Transfers Overall transfer level: Needs assistance Equipment used: Rolling walker (2 wheeled) Transfers: Sit to/from Stand Sit to Stand: Mod assist         General transfer comment: VC's for hand placement on seated surface for safety. Bed height elevated for ease of power-up, however still required mod assist to achieve full stand.  Ambulation/Gait Ambulation/Gait assistance: Min assist;+2 safety/equipment Gait Distance (Feet): 200 Feet Assistive device: Rolling walker (2 wheeled) Gait Pattern/deviations: Step-to pattern;Step-through pattern;Decreased stride length;Ataxic;Narrow base of support;Scissoring Gait velocity: Decreased Gait velocity interpretation: <1.31 ft/sec, indicative of household ambulator General Gait Details: Assist required for balance support and walker management. Chair follow provided and utilized for return to room. Pt initially with step-to pattern progressing to step-through pattern with multimodal cues. Pt scissoring R over L at times but otherwise with mildly ataxic with very narrow BOS.  Stairs            Wheelchair Mobility    Modified Rankin (Stroke Patients Only)       Balance Overall balance assessment: Needs assistance Sitting-balance support: No upper extremity supported;Feet supported Sitting balance-Leahy Scale: Fair     Standing balance support: Bilateral upper extremity supported;During functional activity Standing balance-Leahy Scale: Poor Standing balance comment: Dynamically, reliant on RW for support.                             Pertinent Vitals/Pain Pain Assessment: Faces Faces Pain Scale: Hurts a little bit    Home Living Family/patient expects to be discharged to:: Private residence Living Arrangements: Spouse/significant other Available Help at Discharge: Family;Available PRN/intermittently Type of Home: Apartment Home Access: Stairs  to enter Entrance Stairs-Rails: Left Entrance Stairs-Number of  Steps: 14 Home Layout: Two level Home Equipment: Walker - standard (seeing eye cane) Additional Comments: pt has been sponge bathing with tub since 12/26. pt has an Surveyor, quantity walker/ friend let him borrow a wheelchair. pt reports he wants a walker for 500 pound person so its more sturdy    Prior Function Level of Independence: Independent         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     Hand Dominance   Dominant Hand: Right    Extremity/Trunk Assessment   Upper Extremity Assessment Upper Extremity Assessment: Overall WFL for tasks assessed    Lower Extremity Assessment Lower Extremity Assessment: RLE deficits/detail;LLE deficits/detail RLE Deficits / Details: ataxic movement decreased proprioception LLE Deficits / Details: pain at the lateral aspect of the knee s/p fall 12/26 and ataxic (less than R)    Cervical / Trunk Assessment Cervical / Trunk Assessment: Other exceptions (s/p surgery)  Communication   Communication: No difficulties  Cognition Arousal/Alertness: Awake/alert Behavior During Therapy: WFL for tasks assessed/performed Overall Cognitive Status: Within Functional Limits for tasks assessed                                        General Comments General comments (skin integrity, edema, etc.): x4 falls since 12/26    Exercises     Assessment/Plan    PT Assessment Patient needs continued PT services  PT Problem List Decreased balance;Decreased strength;Decreased activity tolerance;Decreased mobility;Decreased knowledge of use of DME;Decreased safety awareness;Decreased knowledge of precautions;Pain;Impaired sensation       PT Treatment Interventions DME instruction;Gait training;Stair training;Functional mobility training;Therapeutic exercise;Therapeutic activities;Neuromuscular re-education;Patient/family education    PT Goals (Current goals can be found in the Care Plan section)  Acute Rehab PT Goals Patient  Stated Goal: Back to work, no more falls PT Goal Formulation: With patient Time For Goal Achievement: 12/04/20 Potential to Achieve Goals: Good    Frequency Min 5X/week   Barriers to discharge        Co-evaluation               AM-PAC PT "6 Clicks" Mobility  Outcome Measure Help needed turning from your back to your side while in a flat bed without using bedrails?: A Little Help needed moving from lying on your back to sitting on the side of a flat bed without using bedrails?: A Little Help needed moving to and from a bed to a chair (including a wheelchair)?: A Little Help needed standing up from a chair using your arms (e.g., wheelchair or bedside chair)?: A Little Help needed to walk in hospital room?: A Little Help needed climbing 3-5 steps with a railing? : A Lot 6 Click Score: 17    End of Session Equipment Utilized During Treatment: Gait belt;Cervical collar Activity Tolerance: Patient tolerated treatment well Patient left: in chair;with call bell/phone within reach Nurse Communication: Mobility status PT Visit Diagnosis: Unsteadiness on feet (R26.81);Other symptoms and signs involving the nervous system (R29.898);Pain;Repeated falls (R29.6) Pain - part of body:  (Incision site)    Time: 2094-7096 PT Time Calculation (min) (ACUTE ONLY): 31 min   Charges:   PT Evaluation $PT Eval Moderate Complexity: 1 Mod PT Treatments $Gait Training: 8-22 mins        [name], PT, DPT Acute Rehabilitation Services Pager: [phone] Office: [phone]   Charisse March  Dalonda Simoni 11/20/2020, 10:30 AM

## 2020-11-21 ENCOUNTER — Inpatient Hospital Stay (HOSPITAL_COMMUNITY): Payer: BC Managed Care – PPO

## 2020-11-21 MED ORDER — LEVETIRACETAM 250 MG PO TABS
500.0000 mg | ORAL_TABLET | Freq: Two times a day (BID) | ORAL | Status: DC
  Administered 2020-11-21 – 2020-11-23 (×4): 500 mg via ORAL
  Filled 2020-11-21 (×4): qty 2

## 2020-11-21 MED ORDER — LEVETIRACETAM 250 MG PO TABS
1000.0000 mg | ORAL_TABLET | Freq: Once | ORAL | Status: AC
  Administered 2020-11-21: 1000 mg via ORAL
  Filled 2020-11-21: qty 4

## 2020-11-21 NOTE — Progress Notes (Signed)
Neurosurgery (12:30 pm)  S: Seizure-like activity noted this morning. Patient states he drinks alcohol 3 to 4 days per week, though he does not binge drink And he drinks in moderation.  Vital signs stable. Awake, alert, oriented times three. Blind. Fairly significant dental deformity. 4/5 strength in and grab bilaterally. No pronator drift. 4+/5 strengthen lower extremities. Some deconditioning noted.  CT head witho ut contrast showed no acute intracranial abnormality.  Labs wnl   S/p 2-lvl ACDF - will continue on Keppra - likely awaiting rehab

## 2020-11-21 NOTE — Anesthesia Postprocedure Evaluation (Signed)
Anesthesia Post Note  Patient: Wayne Mckinney  Procedure(s) Performed: ANTERIOR CERVICAL DECOMPRESSION FUSION CERVICAL FIVE-SIX, CERVICAL SIX-SEVEN (N/A Spine Cervical)     Patient location during evaluation: PACU Anesthesia Type: General Level of consciousness: awake and alert Pain management: pain level controlled Vital Signs Assessment: post-procedure vital signs reviewed and stable Respiratory status: spontaneous breathing, nonlabored ventilation, respiratory function stable and patient connected to nasal cannula oxygen Cardiovascular status: blood pressure returned to baseline and stable Postop Assessment: no apparent nausea or vomiting Anesthetic complications: no   No complications documented.  Last Vitals:  Vitals:   11/20/20 2320 11/21/20 0410  BP: 132/69 126/86  Pulse: 69 68  Resp: 18 20  Temp: 36.5 C 36.8 C  SpO2: 98% 100%    Last Pain:  Vitals:   11/21/20 0557  TempSrc:   PainSc: 2                  [name]

## 2020-11-21 NOTE — Progress Notes (Signed)
Inpatient Rehab Admissions Coordinator:   Met with patient and wife to confirm support at discharge.  Wife planning to take FMLA and will need assist from [facility] team on rehab to arrange documentation for this.  I do have insurance authorization but no beds available today.  Will follow for admission once bed available, hopefully in the next few days.   Shann Medal, PT, DPT Admissions Coordinator [phone] 11/21/20  3:50 PM

## 2020-11-21 NOTE — Progress Notes (Signed)
Pt was ambulating with PT in the hallway when his knee buckled and the Pt stated he felt dizzy. PT sat the Pt down in a wheelchair to get him back to the room. When the Pt was sitting in the wheelchair his eyes rolled back and he became unresponsive. He then had a seizure for about 1 minute with loss of bowel and bladder. Pt was assisted back to bed. Pt then become responsive and was able to answer questions appropriately. Vitals stable. MD notified and orders were given. Will continue to monitor Pt. Rema Fendt, RN

## 2020-11-21 NOTE — Progress Notes (Signed)
Physical Therapy Treatment Patient Details Name: Wayne Mckinney MRN: [id number] DOB: [birth year] Today's Date: 11/21/2020    History of Present Illness Pt is a 55 y/o male with cord compression who is now s/p ACDF C5-7 on 11/19/20. PMH HTN legally blind    PT Comments    Pt progressing with post-op mobility. He was able to demonstrate transfers and ambulation with gross min assist and RW for support. During gait training, pt with episode of unresponsiveness after complaining of dizziness and feeling that he was going to pass out. Seizure-like activity noted for ~1 minute with loss of bowel and bladder. RN present to assess and PT assisted her in returning pt to bed. Will continue to follow.      Follow Up Recommendations  CIR;Supervision for mobility/OOB     Equipment Recommendations  Rolling walker with 5" wheels    Recommendations for Other Services Rehab consult     Precautions / Restrictions Precautions Precautions: Fall;Cervical Precaution Comments: Verbally reviewed precautions due to blindness. Required Braces or Orthoses: Cervical Brace Cervical Brace: Hard collar;At all times Restrictions Weight Bearing Restrictions: No    Mobility  Bed Mobility Overal bed mobility: Needs Assistance Bed Mobility: Rolling;Sidelying to Sit Rolling: Min guard Sidelying to sit: Min guard       General bed mobility comments: HOB elevated. Max VC's and hand-over-hand assist to reach for bed rails. Pt required increased time and effort to get LE's off EOB and elevate trunk to full sitting position.  Transfers Overall transfer level: Needs assistance Equipment used: Rolling walker (2 wheeled) Transfers: Sit to/from Stand Sit to Stand: Mod assist         General transfer comment: VC's for hand placement on seated surface for safety. Bed height elevated for ease of power-up, however still required mod assist to achieve full stand.  Ambulation/Gait Ambulation/Gait assistance: Min  assist Gait Distance (Feet): 150 Feet Assistive device: Rolling walker (2 wheeled) Gait Pattern/deviations: Step-to pattern;Step-through pattern;Decreased stride length;Ataxic;Narrow base of support;Scissoring Gait velocity: Decreased Gait velocity interpretation: <1.31 ft/sec, indicative of household ambulator General Gait Details: Assist required for balance support and walker management. Pt initially with step-to pattern progressing to step-through pattern with multimodal cues. Pt scissoring R over L at times but otherwise with mildly ataxic with very narrow BOS.   Stairs             Wheelchair Mobility    Modified Rankin (Stroke Patients Only)       Balance Overall balance assessment: Needs assistance Sitting-balance support: No upper extremity supported;Feet supported Sitting balance-Leahy Scale: Fair     Standing balance support: Bilateral upper extremity supported;During functional activity Standing balance-Leahy Scale: Poor Standing balance comment: Dynamically, reliant on RW for support.                            Cognition Arousal/Alertness: Awake/alert Behavior During Therapy: WFL for tasks assessed/performed Overall Cognitive Status: Within Functional Limits for tasks assessed                                        Exercises      General Comments        Pertinent Vitals/Pain Pain Assessment: Faces Faces Pain Scale: Hurts a little bit    Home Living  Prior Function            PT Goals (current goals can now be found in the care plan section) Acute Rehab PT Goals Patient Stated Goal: Back to work, no more falls PT Goal Formulation: With patient Time For Goal Achievement: 12/04/20 Potential to Achieve Goals: Good Progress towards PT goals: Progressing toward goals    Frequency    Min 5X/week      PT Plan Current plan remains appropriate    Co-evaluation               AM-PAC PT "6 Clicks" Mobility   Outcome Measure  Help needed turning from your back to your side while in a flat bed without using bedrails?: A Little Help needed moving from lying on your back to sitting on the side of a flat bed without using bedrails?: A Little Help needed moving to and from a bed to a chair (including a wheelchair)?: A Little Help needed standing up from a chair using your arms (e.g., wheelchair or bedside chair)?: A Little Help needed to walk in hospital room?: A Little Help needed climbing 3-5 steps with a railing? : A Lot 6 Click Score: 17    End of Session Equipment Utilized During Treatment: Gait belt;Cervical collar Activity Tolerance: Patient tolerated treatment well Patient left: in bed;with call bell/phone within reach;with nursing/sitter in room Nurse Communication: Mobility status PT Visit Diagnosis: Unsteadiness on feet (R26.81);Other symptoms and signs involving the nervous system (R29.898);Pain;Repeated falls (R29.6) Pain - part of body:  (Incision site)     Time: 6606-3016 PT Time Calculation (min) (ACUTE ONLY): 27 min  Charges:  $Gait Training: 23-37 mins                     Wayne Mckinney, PT, DPT Acute Rehabilitation Services Pager: [phone] Office: [phone]    Wayne Mckinney 11/21/2020, 11:01 AM

## 2020-11-22 ENCOUNTER — Encounter (HOSPITAL_COMMUNITY): Payer: Self-pay | Admitting: Neurological Surgery

## 2020-11-22 ENCOUNTER — Other Ambulatory Visit (HOSPITAL_COMMUNITY): Payer: Self-pay | Admitting: Neurological Surgery

## 2020-11-22 DIAGNOSIS — M4712 Other spondylosis with myelopathy, cervical region: Secondary | ICD-10-CM | POA: Diagnosis not present

## 2020-11-22 DIAGNOSIS — G959 Disease of spinal cord, unspecified: Secondary | ICD-10-CM

## 2020-11-22 MED ORDER — LEVETIRACETAM 500 MG PO TABS: 500.0000 mg | ORAL_TABLET | Freq: Two times a day (BID) | ORAL | 6 refills | Status: DC

## 2020-11-22 MED ORDER — HEPARIN SODIUM (PORCINE) 5000 UNIT/ML IJ SOLN
5000.0000 [IU] | Freq: Three times a day (TID) | INTRAMUSCULAR | Status: DC
  Administered 2020-11-22 – 2020-11-23 (×2): 5000 [IU] via SUBCUTANEOUS
  Filled 2020-11-22 (×2): qty 1

## 2020-11-22 MED ORDER — HYDROCODONE-ACETAMINOPHEN 5-325 MG PO TABS: 1.0000 | ORAL_TABLET | ORAL | 0 refills | Status: DC | PRN

## 2020-11-22 MED ORDER — METHOCARBAMOL 500 MG PO TABS: 500.0000 mg | ORAL_TABLET | Freq: Four times a day (QID) | ORAL | 2 refills | Status: DC | PRN

## 2020-11-22 NOTE — Progress Notes (Addendum)
   Providing Compassionate, Quality Care - Together  NEUROSURGERY PROGRESS NOTE   S: No issues overnight.  Pain controlled, feels as though his numbness/tingling and strength is improving in his legs.  He is ambulating further than preop  O: EXAM:  BP 119/69 (BP Location: Right Arm)   Pulse 73   Temp 97.7 F (36.5 C) (Oral)   Resp 17   Ht 6\' 2"  (1.88 m)   Wt 89.8 kg   SpO2 100%   BMI 25.42 kg/m   Awake, alert, oriented  Speech fluent, appropriate  Face symmetric, visually impaired 4+/5 BUE 4/5 BLE  Incision c/d/i Neck soft, trachea midline  ASSESSMENT:  55 y.o. male with   1. C5-7 cord compression with myelopathy 2. ? Sz versus syncopal event  S/p ACDF C5-7 on 11/19/2020  PLAN: -pt/ot - pain control - collar - keppra - rehab tomorrow, CIR has bed ready then   Thank you for allowing me to participate in this patient's care.  Please do not hesitate to call with questions or concerns.   01/17/2021, DO Neurosurgeon [facility] Cell: [phone]

## 2020-11-22 NOTE — Progress Notes (Signed)
Inpatient Rehab Admissions Coordinator:   I have a bed for this pt to admit to CIR on Saturday (1/15).  Dr. Jake Samples in agreement.  Rehab MD (Dr. Carlis Abbott) to assess pt and confirm admission on Saturday.  Floor RN can call CIR at [phone] for report after 12pm on Saturday.  I will let pt/family and case manager know.    Estill Dooms, PT, DPT Admissions Coordinator [phone] 11/22/20  10:51 AM

## 2020-11-22 NOTE — PMR Pre-admission (Signed)
PMR Admission Coordinator Pre-Admission Assessment  Patient: Wayne Mckinney is an 55 y.o., male MRN: [id number] DOB: [birth year] Height: 6\' 2"  (188 cm) Weight: 89.8 kg  Insurance Information HMO:     PPO: yes     PCP:      IPA:      80/20:      OTHER:  PRIMARY: BCBS Commercial      Policy#: [id number]      Subscriber: pt CM Name: Celso Amy      Phone#: [phone]     Fax#: [phone] Pre-Cert#: [id number] [place] for CIR given from Larene Beach at [facility] via fax, with updates to due her at fax listed above on 12/05/2020      Employer: [employer]:  Phone #: [phone]     Name:  Eff. Date: 02/08/20     Deduct: $5000 ($0 met)      Out of Pocket Max: [phone] (met $489.06)      Life Max: n/a CIR: 50%      SNF: 50% (limit 60 days) Outpatient:      Co-Pay: $100/visit (limited 30 PT/OT, 30 SLP) Home Health: 50%      Co-Ins: 50% DME: 50%     Co-Ins: 50% Providers: preferred network SECONDARY: UHC Medicare      Policy#: [id number]     Phone#: [phone]  Financial Counselor:       Phone#:   The "Data Collection Information Summary" for patients in Inpatient Rehabilitation Facilities with attached "Privacy Act [place] Records" was provided and verbally reviewed with: Patient and Family  Emergency Contact Information Contact Information    Name Relation Home Work Mobile   [place] Spouse   [phone]      Current Medical History  Patient Admitting Diagnosis: cervical myelopathy  History of Present Illness: Pt is a 55 y/o male with PMH of HTN, and progressive weakness 2/2 cervical cord compression and myelopathy.  Pt was admitted on 11/19/20 for planned ACDF C5-7. Post op course pain management. On Post op day 2 pt with seizure like activity during therapy session, decreased level of arousal, unresponsive with loss of B/B, lasting approximately 1 minute.  Workup negative and pt was started on Keppra.  No further episodes.  Therapy continues to recommend  CIR for comprehensive rehab prior to returning home.     [facility]'s medical record from [facility] has been reviewed by the rehabilitation admission coordinator and physician.  Past Medical History  Past Medical History:  Diagnosis Date  . Hypertension   . Legally blind     Family History   family history is not on file.  Prior Rehab/Hospitalizations Has the patient had prior rehab or hospitalizations prior to admission? No  Has the patient had major surgery during 100 days prior to admission? Yes   Current Medications  Current Facility-Administered Medications:  .  0.9 %  sodium chloride infusion, , Intravenous, Continuous, Dawley, Troy C, DO, Last Rate: 75 mL/hr at 11/19/20 Valerie Roys, [place] at 11/19/20 1848 .  acetaminophen (TYLENOL) tablet 650 mg, 650 mg, Oral, Q4H PRN **OR** acetaminophen (TYLENOL) suppository 650 mg, 650 mg, Rectal, Q4H PRN, Dawley, Troy C, DO .  alum & mag hydroxide-simeth (MAALOX/MYLANTA) 200-200-20 MG/5ML suspension 30 mL, 30 mL, Oral, Q6H PRN, Dawley, Troy C, DO .  amLODipine (NORVASC) tablet 10 mg, 10 mg, Oral, Daily, Dawley, Troy C, DO, 10 mg at 11/22/20 0929 .  heparin injection 5,000 Units, 5,000 Units, Subcutaneous, Q8H, Dawley, Troy C, DO .  hydrochlorothiazide (  MICROZIDE) capsule 12.5 mg, 12.5 mg, Oral, Daily, Dawley, Troy C, DO, 12.5 mg at 11/22/20 0929 .  HYDROcodone-acetaminophen (NORCO/VICODIN) 5-325 MG per tablet 1 tablet, 1 tablet, Oral, Q4H PRN, Dawley, Troy C, DO, 1 tablet at 11/22/20 0929 .  HYDROmorphone (DILAUDID) injection 0.5 mg, 0.5 mg, Intravenous, Q3H PRN, Dawley, Troy C, DO .  levETIRAcetam (KEPPRA) tablet 500 mg, 500 mg, Oral, BID, Dawley, Troy C, DO, 500 mg at 11/22/20 0929 .  menthol-cetylpyridinium (CEPACOL) lozenge 3 mg, 1 lozenge, Oral, PRN **OR** phenol (CHLORASEPTIC) mouth spray 1 spray, 1 spray, Mouth/Throat, PRN, Dawley, Troy C, DO .  methocarbamol (ROBAXIN) tablet 500 mg, 500 mg, Oral, Q6H PRN, 500 mg at 11/22/20 0929 **OR**  methocarbamol (ROBAXIN) 500 mg in dextrose 5 % 50 mL IVPB, 500 mg, Intravenous, Q6H PRN, Dawley, Troy C, DO .  ondansetron (ZOFRAN) tablet 4 mg, 4 mg, Oral, Q6H PRN **OR** ondansetron (ZOFRAN) injection 4 mg, 4 mg, Intravenous, Q6H PRN, Dawley, Troy C, DO .  senna-docusate (Senokot-S) tablet 1 tablet, 1 tablet, Oral, QHS PRN, Dawley, Troy C, DO, 1 tablet at 11/20/20 2054  Patients Current Diet:  Diet Order            Diet regular Room service appropriate? Yes; Fluid consistency: Thin  Diet effective now                 Precautions / Restrictions Precautions Precautions: Fall,Cervical Precaution Comments: Precautions reviewed with patient. Good adherence during ADLs. Cervical Brace: Hard collar,At all times Restrictions Weight Bearing Restrictions: No   Has the patient had 2 or more falls or a fall with injury in the past year? 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 Independent  Indoor Mobility: Did the patient need assistance with walking from room to room (with or without device)? Independent  Stairs: Did the patient need assistance with internal or external stairs (with or without device)? Independent  Functional Cognition: Did the patient need help planning regular tasks such as shopping or remembering to take medications? Independent  Home Assistive Devices / Equipment Home Assistive Devices/Equipment: Environmental consultant (specify type),Wheelchair,Eyeglasses Home Equipment: Walker - standard (seeing eye cane)  Prior Device Use: Indicate devices/aids used by the patient prior to current illness, exacerbation or injury? guide cane, also RW more recently  Current Functional  Level Cognition  Overall Cognitive Status: Within Functional Limits for tasks assessed Orientation Level: Oriented X4    Extremity Assessment (includes Sensation/Coordination)  Upper Extremity Assessment: Overall WFL for tasks assessed  Lower Extremity Assessment: RLE deficits/detail,LLE deficits/detail RLE Deficits / Details: ataxic movement decreased proprioception LLE Deficits / Details: pain at the lateral aspect of the knee s/p fall 12/26 and ataxic (less than R)    ADLs  Overall ADL's : Needs assistance/impaired Eating/Feeding: Set up Grooming: Minimal assistance,Standing Upper Body Bathing: Minimal assistance,Bed level Lower Body Bathing: Maximal assistance Upper Body Dressing Details (indicate cue type and reason): total (A) with brace General ADL Comments: pt completed sit<>Stand from bed surface for the first time with max v/c    Mobility  Overal bed mobility: Needs Assistance Bed Mobility: Rolling,Sidelying to Sit Rolling: Min guard Sidelying to sit: Min guard Supine to sit: Mod assist Sit  to supine: Max assist General bed mobility comments: HOB flat to simulate home, use of rail. Good demo of log roll technique, increased time but no physical assist needed.    Transfers  Overall transfer level: Needs assistance Equipment used: Rolling walker (2 wheeled) Transfers: Sit to/from Stand Sit to Stand: Min assist Stand pivot transfers: Min assist General transfer comment: Assist to power to standing with cues for hand placement/technique. Stood from [employer], from chair x1.    Ambulation / Gait / Stairs / Wheelchair Mobility  Ambulation/Gait Ambulation/Gait assistance: Herbalist (Feet): 175 Feet Assistive device: Rolling walker (2 wheeled) Gait Pattern/deviations: Step-to pattern,Step-through pattern,Decreased stride length,Ataxic,Narrow base of support General Gait Details: Slow, mildly unsteady step to gait with increased knee flexion throughout; able  to progress to step through gait with cues. Assist for directional cues and balance. No scissoring today but mildly ataxic with narrow BoS. Gait velocity: Decreased Gait velocity interpretation: <1.31 ft/sec, indicative of household ambulator    Posture / Balance Balance Overall balance assessment: Needs assistance Sitting-balance support: No upper extremity supported,Feet supported Sitting balance-Leahy Scale: Fair Standing balance support: During functional activity,Bilateral upper extremity supported Standing balance-Leahy Scale: Poor Standing balance comment: Reliant on BUE on RW to maintain standing balance during functional tasks.    Special needs/care consideration Skin surgical incision to anterior neck, Special service needs blind and Designated visitor spouse, Jal (from acute therapy documentation) Living Arrangements: Spouse/significant other Available Help at Discharge: Family,Available PRN/intermittently Type of Home: Apartment Home Layout: Two level Alternate Level Stairs-Rails: Left Alternate Level Stairs-Number of Steps: 14 Home Access: Stairs to enter Entrance Stairs-Rails: Left Entrance Stairs-Number of Steps: 14 Bathroom Shower/Tub: Chiropodist: [place]: No Additional Comments: pt has been sponge bathing with tub since 12/26. pt has an Programmer, systems walker/ friend let him borrow a wheelchair. pt reports he wants a walker for 500 pound person so its more sturdy  Discharge Living Setting Plans for Discharge Living Setting: Patient's home,Lives with (comment) (spouse) Type of Home at Discharge: Apartment Discharge Home Layout: One level Discharge Home Access: Stairs to enter (2nd floor apt, trying to get 1st floor) Entrance Stairs-Rails: Left Entrance Stairs-Number of Steps: 14 (5+9 with landing) Discharge Bathroom Shower/Tub: Tub/shower unit Discharge Bathroom Toilet:  Standard Discharge Bathroom Accessibility: Yes How Accessible: Accessible via walker Does the patient have any problems obtaining your medications?: No  Social/Family/Support Systems Patient Roles: Spouse Anticipated Caregiver: wife, Jimel Myler Anticipated Caregiver's Contact Information: [phone] Ability/Limitations of Caregiver: she is also legally blind, works FT but taking Field seismologist Availability: 24/7 (initially) Discharge Plan Discussed with Primary Caregiver: Yes Is Caregiver In Agreement with Plan?: Yes Does Caregiver/Family have Issues with Lodging/Transportation while Pt is in Rehab?: Yes (relies on friends/transport to get to/from hospital)  Goals Patient/Family Goal for Rehab: PT/OT supervision to CGA in familiar environment, min assist in unfamiliar environment Expected length of stay: 7-10 days Additional Information: Pt is blind; does not see light/shapes/shadows Pt/Family Agrees to Admission and willing to participate: Yes Program Orientation Provided & Reviewed with Pt/Caregiver Including Roles  & Responsibilities: Yes  Barriers to Discharge: Insurance for SNF coverage  Decrease burden of Care through IP rehab admission: n/a  Possible need for SNF placement upon discharge: Not anticipated.   Patient Condition: I have reviewed medical records from [facility], spoken with CSW, and patient and spouse. I met with patient at the bedside for inpatient rehabilitation assessment.  Patient will benefit from ongoing PT and OT, can actively participate in 3 hours of therapy a day 5 days of the week, and can make measurable gains during the admission.  Patient will also benefit from the coordinated team approach during an Inpatient Acute Rehabilitation admission.  The patient will receive intensive therapy as well as Rehabilitation physician, nursing, social worker, and care management interventions.  Due to safety, skin/wound care, disease management, medication  administration, pain management and patient education the patient requires 24 hour a day rehabilitation nursing.  The patient is currently min assist with mobility and basic ADLs.  Discharge setting and therapy post discharge at home with home health is anticipated.  Patient has agreed to participate in the Acute Inpatient Rehabilitation Program and will admit Saturday (1/15).  Preadmission Screen Completed By:  Michel Santee, PT, DPT 11/22/2020 11:14 AM ______________________________________________________________________   Discussed status with Dr. Ranell Patrick on 11/22/20 at 11:28 AM  and received approval for admission Saturday 1/15.   Admission Coordinator:  Michel Santee, PT, DPT time 11:28 AM Sudie Grumbling 11/22/20    Assessment/Plan: Diagnosis: s/p ACDF for cervical myelopathy 1. Does the need for close, 24 hr/day Medical supervision in concert with the patient's rehab needs make it unreasonable for this patient to be served in a less intensive setting? Yes 2. Co-Morbidities requiring supervision/potential complications: postoperative pain, HTN, macular degeneration with visual loss, h/o multiple falls, LLE>RLE weakness 3. Due to bladder management, bowel management, safety, skin/wound care, disease management, medication administration, pain management and patient education, does the patient require 24 hr/day rehab nursing? Yes 4. Does the patient require coordinated care of a physician, rehab nurse, PT, OT to address physical and functional deficits in the context of the above medical diagnosis(es)? Yes Addressing deficits in the following areas: balance, endurance, locomotion, strength, transferring, bowel/bladder control, bathing, dressing, feeding, grooming, toileting and psychosocial support 5. Can the patient actively participate in an intensive therapy program of at least 3 hrs of therapy 5 days a week? Yes 6. The potential for patient to make measurable gains while on inpatient rehab is  excellent 7. Anticipated functional outcomes upon discharge from inpatient rehab: modified independent PT, modified independent OT, independent SLP 8. Estimated rehab length of stay to reach the above functional goals is: 5-7 days 9. Anticipated discharge destination: Home 10. Overall Rehab/Functional Prognosis: excellent   MD Signature: Leeroy Cha, MD

## 2020-11-22 NOTE — Progress Notes (Signed)
Occupational Therapy Co-Treatment Patient Details Name: Wayne Mckinney MRN: [id number] DOB: January 25, 1966 Today's Date: 11/22/2020    History of present illness Pt is a 55 y/o male with cord compression who is now s/p ACDF C5-7 on 11/19/20. PMH HTN legally blind   OT comments  Co-treatment with OT given patient episode of unresponsiveness 1/13. Session with focus on self-care re-education, ADL transfers, and functional mobility. Patient making good progress toward goals completing 2/3 grooming tasks standing at sink level with Min A 2/2 visual deficits and at least unilateral UE support on RW. Patient would benefit from continued acute OT services in prep for safe d/c to next level of care. Continued recommendation for CIR.   Follow Up Recommendations  CIR    Equipment Recommendations  3 in 1 bedside commode;Other (comment) (RW)    Recommendations for Other Services      Precautions / Restrictions Precautions Precautions: Fall;Cervical Precaution Comments: Precautions reviewed with patient. Good adherence during ADLs. Required Braces or Orthoses: Cervical Brace Cervical Brace: Hard collar;At all times Restrictions Weight Bearing Restrictions: No       Mobility Bed Mobility Overal bed mobility: Needs Assistance Bed Mobility: Rolling;Sidelying to Sit Rolling: Min guard Sidelying to sit: Min guard          Transfers Overall transfer level: Needs assistance Equipment used: Rolling walker (2 wheeled) Transfers: Sit to/from [employer] Sit to Stand: Min assist Stand pivot transfers: Min assist       General transfer comment: Min  for sit to stand from EOB x1 trial and recliner x1 trial with verbal cues for hand placement and walker management.    Balance Overall balance assessment: Needs assistance Sitting-balance support: No upper extremity supported;Feet supported Sitting balance-Leahy Scale: Fair     Standing balance support: Bilateral upper extremity  supported;During functional activity Standing balance-Leahy Scale: Poor Standing balance comment: Reliant on BUE on RW to maintain standing balance during functional tasks.                           ADL either performed or assessed with clinical judgement   ADL       Grooming: Minimal assistance;Standing       Lower Body Bathing: Maximal assistance                               Vision       Perception     Praxis      Cognition Arousal/Alertness: Awake/alert Behavior During Therapy: WFL for tasks assessed/performed Overall Cognitive Status: Within Functional Limits for tasks assessed                                          Exercises     Shoulder Instructions       General Comments      Pertinent Vitals/ Pain       Pain Assessment: Faces Faces Pain Scale: Hurts a little bit Pain Location: LLE Pain Descriptors / Indicators: Other (Comment) (Twitching) Pain Intervention(s): Limited activity within patient's tolerance;Monitored during session  Home Living                                          Prior  Functioning/Environment              Frequency  Min 3X/week        Progress Toward Goals  OT Goals(current goals can now be found in the care plan section)  Progress towards OT goals: Progressing toward goals  Acute Rehab OT Goals Patient Stated Goal: Back to work, no more falls OT Goal Formulation: With patient Time For Goal Achievement: 12/04/20 Potential to Achieve Goals: Good ADL Goals Pt Will Transfer to Toilet: with min assist;bedside commode;stand pivot transfer Additional ADL Goal #1: Pt will complete bed mobility min (A) as precursor to adls Additional ADL Goal #2: Pt will verbalize cervical precautions 100%  Plan Discharge plan remains appropriate;Frequency remains appropriate    Co-evaluation                 AM-PAC OT "6 Clicks" Daily Activity     Outcome  Measure   Help from another person eating meals?: A Little Help from another person taking care of personal grooming?: A Little Help from another person toileting, which includes using toliet, bedpan, or urinal?: A Lot Help from another person bathing (including washing, rinsing, drying)?: A Lot Help from another person to put on and taking off regular upper body clothing?: A Lot Help from another person to put on and taking off regular lower body clothing?: A Lot 6 Click Score: 14    End of Session Equipment Utilized During Treatment: Gait belt;Rolling walker;Cervical collar  OT Visit Diagnosis: Unsteadiness on feet (R26.81);Muscle weakness (generalized) (M62.81)   Activity Tolerance Patient tolerated treatment well   Patient Left in chair;with call bell/phone within reach   Nurse Communication          Time: 1610-9604 OT Time Calculation (min): 35 min  Charges: OT General Charges $OT Visit: 1 Visit OT Treatments $Self Care/Home Management : 8-22 mins  Delena Casebeer H. OTR/L Supplemental OT, Department of rehab services [phone]   Fausto Sampedro R H. 11/22/2020, 8:35 AM

## 2020-11-22 NOTE — Progress Notes (Signed)
Physical Therapy Treatment Patient Details Name: Wayne Mckinney MRN: [id number] DOB: 12-09-1965 Today's Date: 11/22/2020    History of Present Illness Pt is a 55 y/o male with cord compression who is now s/p ACDF C5-7 on 11/19/20. PMH HTN legally blind    PT Comments    Patient progressing well towards PT goals. Reports feeling pretty good today. Requires Min A to stand from all surfaces with cues for hand placement/technique. Improved ambulation distance with Min A for balance/safety and directional cues. Reports some twitching on medial side of right leg. Reviewed cervical precautions during session. Pt demonstrated good adherence to precautions during ADL session at sink with improved standing tolerance. Continues to demonstrate weakness and decreased endurance. No seizures today. Continues to be appropriate for CIR. Will follow.   Follow Up Recommendations  CIR;Supervision for mobility/OOB     Equipment Recommendations  Rolling walker with 5" wheels    Recommendations for Other Services       Precautions / Restrictions Precautions Precautions: Fall;Cervical Precaution Comments: Precautions reviewed with patient. Good adherence during ADLs. Required Braces or Orthoses: Cervical Brace Cervical Brace: Hard collar;At all times Restrictions Weight Bearing Restrictions: No    Mobility  Bed Mobility Overal bed mobility: Needs Assistance Bed Mobility: Rolling;Sidelying to Sit Rolling: Min guard Sidelying to sit: Min guard       General bed mobility comments: HOB flat to simulate home, use of rail. Good demo of log roll technique, increased time but no physical assist needed.  Transfers Overall transfer level: Needs assistance Equipment used: Rolling walker (2 wheeled) Transfers: Sit to/from Stand Sit to Stand: Min assist Stand pivot transfers: Min assist       General transfer comment: Assist to power to standing with cues for hand placement/technique. Stood from [employer], from chair x1.  Ambulation/Gait Ambulation/Gait assistance: Min assist Gait Distance (Feet): 175 Feet Assistive device: Rolling walker (2 wheeled) Gait Pattern/deviations: Step-to pattern;Step-through pattern;Decreased stride length;Ataxic;Narrow base of support Gait velocity: Decreased   General Gait Details: Slow, mildly unsteady step to gait with increased knee flexion throughout; able to progress to step through gait with cues. Assist for directional cues and balance. No scissoring today but mildly ataxic with narrow BoS.   Stairs             Wheelchair Mobility    Modified Rankin (Stroke Patients Only)       Balance Overall balance assessment: Needs assistance Sitting-balance support: No upper extremity supported;Feet supported Sitting balance-Leahy Scale: Fair     Standing balance support: During functional activity;Bilateral upper extremity supported Standing balance-Leahy Scale: Poor Standing balance comment: Reliant on BUE on RW to maintain standing balance during functional tasks.                            Cognition Arousal/Alertness: Awake/alert Behavior During Therapy: WFL for tasks assessed/performed Overall Cognitive Status: Within Functional Limits for tasks assessed                                        Exercises      General Comments        Pertinent Vitals/Pain Pain Assessment: Faces Faces Pain Scale: Hurts a little bit Pain Location: LLE Pain Descriptors / Indicators: Other (Comment) (twitching) Pain Intervention(s): Monitored during session;Repositioned;Limited activity within patient's tolerance    Home Living  Prior Function            PT Goals (current goals can now be found in the care plan section) Acute Rehab PT Goals Patient Stated Goal: Back to work, no more falls Progress towards PT goals: Progressing toward goals    Frequency    Min 5X/week      PT  Plan Current plan remains appropriate    Co-evaluation              AM-PAC PT "6 Clicks" Mobility   Outcome Measure  Help needed turning from your back to your side while in a flat bed without using bedrails?: A Little Help needed moving from lying on your back to sitting on the side of a flat bed without using bedrails?: A Little Help needed moving to and from a bed to a chair (including a wheelchair)?: A Little Help needed standing up from a chair using your arms (e.g., wheelchair or bedside chair)?: A Little Help needed to walk in hospital room?: A Little Help needed climbing 3-5 steps with a railing? : A Lot 6 Click Score: 17    End of Session Equipment Utilized During Treatment: Gait belt;Cervical collar Activity Tolerance: Patient tolerated treatment well Patient left: in chair;with call bell/phone within reach Nurse Communication: Mobility status PT Visit Diagnosis: Unsteadiness on feet (R26.81);Other symptoms and signs involving the nervous system (R29.898);Pain;Repeated falls (R29.6) Pain - Right/Left: Right Pain - part of body: Leg     Time: 0947-0962 PT Time Calculation (min) (ACUTE ONLY): 35 min  Charges:  $Gait Training: 8-22 mins                     Vale Haven, PT, DPT Acute Rehabilitation Services Pager [phone] Office [phone]       Blake Divine A Lanier Ensign 11/22/2020, 9:52 AM

## 2020-11-22 NOTE — Discharge Summary (Signed)
  Physician Discharge Summary  Patient ID: Wayne Mckinney MRN: [id number] DOB/AGE: [id number] y.o.  Admit date: 11/19/2020 Discharge date: 11/22/2020  Admission Diagnoses:  1.  Cervical spondylosis C5-7 with cord compression and myelopathy  Discharge Diagnoses:  Same Active Problems:   Cervical myelopathy [facility])   Discharged Condition: Stable  Hospital Course:  Wayne Mckinney is a 55 y.o. male that presented to my office with severe cervical cord compression and myelopathy.  He underwent an ACDF C5-7 on 11/19/20, which he tolerated surgery well.  Postoperatively his numbness tingling and weakness was improving.  Physical therapy and Occupational Therapy evaluated the patient and recommended inpatient rehab in which he was excepted.  His pain was controlled with oral medication upon discharge.  He was having normal bowel and bladder function upon discharge.  On postoperative day #2 during ambulating with physical therapy he had an episode of a syncopal versus seizure event with loss of bowel and bladder that lasted approximately 1 minute.  CT of the brain was negative, he was started on antiepileptics (Keppra).  He was continued to be monitored and had no further similar episodes.  His incision was clean dry and intact.  Treatments: Surgery -ACDF C5-7 on 11/19/2020  Discharge Exam: Blood pressure 119/69, pulse 73, temperature 97.7 F (36.5 C), temperature source Oral, resp. rate 17, height 6\' 2"  (1.88 m), weight 89.8 kg, SpO2 100 %. Awake, alert, oriented Speech fluent, appropriate Visually impaired Face symmetric Trachea midline 4+/5 BUE 4/5 BLE Wound c/d/i  Disposition: Discharge disposition: 03-Skilled Nursing Facility       Discharge Instructions    Incentive spirometry RT   Complete by: As directed      Allergies as of 11/22/2020   No Known Allergies     Medication List    TAKE these medications   amLODipine 10 MG tablet Commonly known as: NORVASC Take 10 mg  by mouth daily.   hydrochlorothiazide 12.5 MG capsule Commonly known as: MICROZIDE Take 12.5 mg by mouth daily.   HYDROcodone-acetaminophen 5-325 MG tablet Commonly known as: NORCO/VICODIN Take 1 tablet by mouth every 4 (four) hours as needed for moderate pain ((score 4 to 6)).   levETIRAcetam 500 MG tablet Commonly known as: KEPPRA Take 1 tablet (500 mg total) by mouth 2 (two) times daily.   methocarbamol 500 MG tablet Commonly known as: ROBAXIN Take 1 tablet (500 mg total) by mouth every 6 (six) hours as needed for muscle spasms.       Follow-up Information    Daziyah Cogan C, DO Follow up in 2 week(s).   Why: for staple removal, if still in rehab I will remove them in rehab. Can come for postop appointment after rehab is completed Contact information: [address] [place] [address] [place] [phone]               Signed: [phone] Sharunda Salmon 11/22/2020, 10:45 AM

## 2020-11-22 NOTE — H&P (Signed)
Physical Medicine and Rehabilitation Admission H&P    CC: Functional decline due to cervical myelopathy/cord compression.    HPI: Wayne Mckinney is a 55 year old male with history of HTN, macular degeneration with visual loss, multiple falls that started 3-4 weeks ago with BLE weakness and numbness as well as progressive balance deficits.  Work up revealed cervical spondylosis with C6/C7 disc osteophyte with disc protrusion and severe canal stenosis and cord compression with edema and/or myelomalacia, moderate canal stenosis C5/C6 and moderate end plate edema Y0/D9 and C6/C7.  He was admitted on 11/20/19 for ACDF C5-C7 by Dr. Jake Samples. Post op to wear hard collar and therapy initiated. On 01/13, he has episode of unresponsiveness with therapy with eye rolling backwards, seizure type activity and loss of B/B control.  CT head negative for acute changes and low dose Keppra added due to concerns of seizure. Therapy ongoing and patient noted to have limitations due to ataxia with LLE>RLE weakness, numbness and balance deficits. CIR recommended due to functional decline. He is currently ambulating 250 feet MinA with PT and is expected to have a 5-7 day stay.   Review of Systems  Constitutional: Negative for chills and fever.  HENT: Positive for hearing loss.   Eyes: Negative for discharge.       Blind since 2007  Respiratory: Negative for cough and shortness of breath.   Cardiovascular: Negative for chest pain, palpitations and leg swelling.  Gastrointestinal: Positive for constipation. Negative for abdominal pain and nausea.  Genitourinary: Negative for dysuria and urgency.  Musculoskeletal: Positive for falls, joint pain (left greater than right knee), myalgias and neck pain.  Neurological: Positive for dizziness, sensory change (numbness/tingling BLE) and weakness. Negative for speech change and headaches.  Psychiatric/Behavioral: The patient is not nervous/anxious and does not have insomnia.       Past Medical History:  Diagnosis Date  . Hypertension   . Legally blind 2007   due to macular degeneration     Past Surgical History:  Procedure Laterality Date  . ANTERIOR CERVICAL DECOMP/DISCECTOMY FUSION N/A 11/19/2020   Procedure: ANTERIOR CERVICAL DECOMPRESSION FUSION CERVICAL FIVE-SIX, CERVICAL SIX-SEVEN;  Surgeon: Dawley, Alan Mulder, DO;  Location: [facility] OR;  Service: Neurosurgery;  Laterality: N/A;  anterior  . NO PAST SURGERIES      Family History  Problem Relation Age of Onset  . Cataracts Father      Social History:  Wayne Mckinney is legally blind. They use [employer] for transportation--no local family. 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 hreports that he has quit smoking. He has quit using smokeless tobacco. He reports current alcohol use of about 3 beers and couple of mixed drinks per day. He reports that he does not use drugs.    Allergies: No Known Allergies    Medications Prior to Admission  Medication Sig Dispense Refill  . amLODipine (NORVASC) 10 MG tablet Take 10 mg by mouth daily.    . hydrochlorothiazide (MICROZIDE) 12.5 MG capsule Take 12.5 mg by mouth daily.      Drug Regimen Review  Drug regimen was reviewed and remains appropriate with no significant issues identified  Home: Home Living Family/patient expects to be discharged to:: Private residence Living Arrangements: Spouse/significant other Available Help at Discharge: Family,Available PRN/intermittently Type of Home: Apartment Home Access: Stairs to enter Entrance Stairs-Number of Steps: 14 Entrance Stairs-Rails: Left Home Layout: Two level Alternate Level Stairs-Number of Steps: 14 Alternate Level Stairs-Rails:  Left Bathroom Shower/Tub: Tub/shower unit Bathroom Toilet: Standard Home Equipment: Walker - standard (seeing eye cane) Additional Comments: pt has been sponge bathing with tub since 12/26. pt has an Public house manager walker/ friend let him borrow a wheelchair. 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 Good demo of log roll technique, increased time but no physical assist needed. Transfers Overall transfer level: Needs assistance Equipment used: Rolling walker (2 wheeled) Transfers: Sit to/from Stand Sit to Stand: Min assist Stand pivot transfers: Min assist General transfer comment: Assist to power to standing with cues for hand placement/technique. Stood from [employer], from chair x1. Ambulation/Gait Ambulation/Gait assistance: Min assist Gait Distance (Feet): 175 Feet Assistive device: Rolling walker (2 wheeled) Gait Pattern/deviations: Step-to pattern,Step-through pattern,Decreased stride length,Ataxic,Narrow base of support General Gait Details: Slow, mildly unsteady step to gait with increased knee flexion throughout; able to progress to step through gait with cues. Assist for directional cues and balance. No scissoring today but mildly ataxic with narrow BoS. Gait velocity: Decreased Gait velocity interpretation: <1.31 ft/sec, indicative of household ambulator    ADL: ADL Overall ADL's : Needs assistance/impaired Eating/Feeding: Set up Grooming: Minimal assistance,Standing Upper Body Bathing: Minimal assistance,Bed level Lower Body Bathing: Maximal assistance Upper Body Dressing Details (indicate cue type and reason): total (A) with brace General ADL Comments: pt completed sit<>Stand from bed surface for the first time with max  v/c  Cognition: Cognition Overall Cognitive Status: Within Functional Limits for tasks assessed Orientation Level: Oriented X4 Cognition Arousal/Alertness: Awake/alert Behavior During Therapy: WFL for tasks assessed/performed Overall Cognitive Status: Within Functional Limits for tasks assessed   Blood pressure 128/79, pulse 72, temperature 97.8 F (36.6 C), temperature source Oral, resp. rate 16, height 6\' 2"  (1.88 m), weight 89.8 kg, SpO2 99 %. Physical Exam Gen: no distress, normal appearing HEENT: Immobilized with [place] J collar. Anterior incision with honeycomb dressings.  Cardio: Reg rate Chest: normal effort, normal rate of breathing Abd: soft, non-distended Ext: no edema Psych: pleasant, normal affect Skin: intact Musculoskeletal:        General: Swelling present.     Comments: Left knee with effusion and skin changes due to resolving ecchymosis? Tender on medial joint line. Right knee with minimal edema.   Neurological:     Mental Status: He is alert and oriented to person, place, and time.     No results found for this or any previous visit (from the past 48 hour(s)). CT HEAD WO CONTRAST  Result Date: 11/21/2020 CLINICAL DATA:  Possible seizure. EXAM: CT HEAD WITHOUT CONTRAST TECHNIQUE: Contiguous axial images were obtained from the base of the skull through the vertex without intravenous contrast. COMPARISON:  None. FINDINGS: Brain: No evidence of acute large vascular territory infarction, hemorrhage, hydrocephalus, extra-axial collection or mass lesion/mass effect. Mild bilateral prominence of the extra-axial spaces posteriorly. Mild patchy white matter hypoattenuation, most likely related to chronic microvascular ischemic disease. Vascular: Calcific atherosclerosis. Skull: No acute fracture. Sinuses/Orbits: Clear sinuses.  Unremarkable orbits. Other: No mastoid effusions. Soft tissue in bilateral external auditory canals, likely cerumen. IMPRESSION: No evidence of acute  intracranial abnormality. Electronically Signed   By: Feliberto Harts MD   On: 11/21/2020 15:49       Medical Problem List and Plan: 1.  Impaired mobility and ADLs secondary to cervical myelopathy s/p ACDF  -patient may shower but spinal incision must be covered.   -ELOS/Goals: modI in 5-7 days 2.  Antithrombotics: -DVT/anticoagulation:  Pharmaceutical: Heparin  -antiplatelet therapy: N/A 3. Pain Management: Hydrocodone prn seems to be effective. Will continue robaxin prn for back muscle spasms. Using approximately twice per day. Discussed decreasing frequency to q6H prn. 4. Mood: LCSW to follow for evaluation and support.   -antipsychotic agents: N/A 5. Neuropsych: This patient is capable of making decisions on his own behalf. 6. Skin/Wound Care: Routine pressure relief measures. Continue surgical dressing for now.  7. Fluids/Electrolytes/Nutrition: Monitor I/O. Check lytes in am.  8. HTN: Monitor BP tid. Monitor for orthostatic changes. Currently under excellent control- continue Norvasc and Microzide. 9. Seizure prophylaxis: On keppra 500 mg bid-->should help with neuropathy also.  10. Constipation: Last received senna-docusate 1/12.  I have personally performed a face to face diagnostic evaluation, including, but not limited to relevant history and physical exam findings, of this patient and developed relevant assessment and plan.  Additionally, I have reviewed and concur with the physician assistant's documentation above.  Sula Soda, MD  Jacquelynn Cree, PA-C 11/22/2020

## 2020-11-23 ENCOUNTER — Other Ambulatory Visit: Payer: Self-pay

## 2020-11-23 ENCOUNTER — Encounter (HOSPITAL_COMMUNITY): Payer: Self-pay | Admitting: Physical Medicine and Rehabilitation

## 2020-11-23 ENCOUNTER — Inpatient Hospital Stay (HOSPITAL_COMMUNITY)
Admission: RE | Admit: 2020-11-23 | Discharge: 2020-12-04 | DRG: 560 | Disposition: A | Discharge status: Home Health | Payer: BC Managed Care – PPO | Source: Intra-hospital | Attending: Physical Medicine and Rehabilitation | Admitting: Physical Medicine and Rehabilitation

## 2020-11-23 DIAGNOSIS — R270 Ataxia, unspecified: Secondary | ICD-10-CM | POA: Diagnosis present

## 2020-11-23 DIAGNOSIS — E8809 Other disorders of plasma-protein metabolism, not elsewhere classified: Secondary | ICD-10-CM | POA: Diagnosis present

## 2020-11-23 DIAGNOSIS — M792 Neuralgia and neuritis, unspecified: Secondary | ICD-10-CM | POA: Diagnosis not present

## 2020-11-23 DIAGNOSIS — G959 Disease of spinal cord, unspecified: Secondary | ICD-10-CM | POA: Diagnosis not present

## 2020-11-23 DIAGNOSIS — Z79899 Other long term (current) drug therapy: Secondary | ICD-10-CM | POA: Diagnosis not present

## 2020-11-23 DIAGNOSIS — S8002XA Contusion of left knee, initial encounter: Secondary | ICD-10-CM | POA: Diagnosis not present

## 2020-11-23 DIAGNOSIS — K5901 Slow transit constipation: Secondary | ICD-10-CM | POA: Diagnosis not present

## 2020-11-23 DIAGNOSIS — Z4789 Encounter for other orthopedic aftercare: Principal | ICD-10-CM

## 2020-11-23 DIAGNOSIS — Z23 Encounter for immunization: Secondary | ICD-10-CM

## 2020-11-23 DIAGNOSIS — Z981 Arthrodesis status: Secondary | ICD-10-CM | POA: Diagnosis not present

## 2020-11-23 DIAGNOSIS — M62830 Muscle spasm of back: Secondary | ICD-10-CM | POA: Diagnosis not present

## 2020-11-23 DIAGNOSIS — H3530 Unspecified macular degeneration: Secondary | ICD-10-CM | POA: Diagnosis not present

## 2020-11-23 DIAGNOSIS — I10 Essential (primary) hypertension: Secondary | ICD-10-CM | POA: Diagnosis present

## 2020-11-23 DIAGNOSIS — K5900 Constipation, unspecified: Secondary | ICD-10-CM | POA: Diagnosis not present

## 2020-11-23 DIAGNOSIS — R569 Unspecified convulsions: Secondary | ICD-10-CM | POA: Diagnosis present

## 2020-11-23 DIAGNOSIS — H548 Legal blindness, as defined in USA: Secondary | ICD-10-CM | POA: Diagnosis not present

## 2020-11-23 DIAGNOSIS — R1084 Generalized abdominal pain: Secondary | ICD-10-CM | POA: Diagnosis not present

## 2020-11-23 DIAGNOSIS — G629 Polyneuropathy, unspecified: Secondary | ICD-10-CM | POA: Diagnosis present

## 2020-11-23 DIAGNOSIS — R109 Unspecified abdominal pain: Secondary | ICD-10-CM

## 2020-11-23 DIAGNOSIS — M25562 Pain in left knee: Secondary | ICD-10-CM | POA: Diagnosis not present

## 2020-11-23 DIAGNOSIS — E876 Hypokalemia: Secondary | ICD-10-CM

## 2020-11-23 MED ORDER — PROCHLORPERAZINE EDISYLATE 10 MG/2ML IJ SOLN: 5.0000 mg | Freq: Four times a day (QID) | INTRAMUSCULAR | Status: DC | PRN

## 2020-11-23 MED ORDER — HYDROCODONE-ACETAMINOPHEN 5-325 MG PO TABS
1.0000 | ORAL_TABLET | ORAL | Status: DC | PRN
Start: 2020-11-23 — End: 2020-11-23

## 2020-11-23 MED ORDER — BISACODYL 10 MG RE SUPP: 10.0000 mg | Freq: Every day | RECTAL | Status: DC | PRN

## 2020-11-23 MED ORDER — HYDROCODONE-ACETAMINOPHEN 5-325 MG PO TABS
1.0000 | ORAL_TABLET | Freq: Four times a day (QID) | ORAL | Status: DC | PRN
  Administered 2020-11-27 – 2020-12-01 (×6): 1 via ORAL
  Filled 2020-11-23 (×6): qty 1

## 2020-11-23 MED ORDER — ALUM & MAG HYDROXIDE-SIMETH 200-200-20 MG/5ML PO SUSP: 30.0000 mL | Freq: Four times a day (QID) | ORAL | Status: DC | PRN

## 2020-11-23 MED ORDER — PROCHLORPERAZINE 25 MG RE SUPP: 12.5000 mg | Freq: Four times a day (QID) | RECTAL | Status: DC | PRN

## 2020-11-23 MED ORDER — HYDROCHLOROTHIAZIDE 12.5 MG PO CAPS
12.5000 mg | ORAL_CAPSULE | Freq: Every day | ORAL | Status: DC
  Administered 2020-11-24 – 2020-12-04 (×11): 12.5 mg via ORAL
  Filled 2020-11-23 (×11): qty 1

## 2020-11-23 MED ORDER — METHOCARBAMOL 500 MG PO TABS
500.0000 mg | ORAL_TABLET | Freq: Four times a day (QID) | ORAL | Status: DC | PRN
  Administered 2020-11-23 – 2020-12-02 (×6): 500 mg via ORAL
  Filled 2020-11-23 (×6): qty 1

## 2020-11-23 MED ORDER — PHENOL 1.4 % MT LIQD
1.0000 | OROMUCOSAL | Status: DC | PRN
  Filled 2020-11-23: qty 177

## 2020-11-23 MED ORDER — PROCHLORPERAZINE MALEATE 5 MG PO TABS: 5.0000 mg | ORAL_TABLET | Freq: Four times a day (QID) | ORAL | Status: DC | PRN

## 2020-11-23 MED ORDER — ALUM & MAG HYDROXIDE-SIMETH 200-200-20 MG/5ML PO SUSP: 30.0000 mL | ORAL | Status: DC | PRN

## 2020-11-23 MED ORDER — AMLODIPINE BESYLATE 10 MG PO TABS
10.0000 mg | ORAL_TABLET | Freq: Every day | ORAL | Status: DC
  Administered 2020-11-24 – 2020-12-04 (×11): 10 mg via ORAL
  Filled 2020-11-23 (×11): qty 1

## 2020-11-23 MED ORDER — GUAIFENESIN-DM 100-10 MG/5ML PO SYRP: 5.0000 mL | ORAL_SOLUTION | Freq: Four times a day (QID) | ORAL | Status: DC | PRN

## 2020-11-23 MED ORDER — LEVETIRACETAM 500 MG PO TABS
500.0000 mg | ORAL_TABLET | Freq: Two times a day (BID) | ORAL | Status: DC
  Administered 2020-11-23 – 2020-12-04 (×22): 500 mg via ORAL
  Filled 2020-11-23 (×22): qty 1

## 2020-11-23 MED ORDER — HEPARIN SODIUM (PORCINE) 5000 UNIT/ML IJ SOLN
5000.0000 [IU] | Freq: Three times a day (TID) | INTRAMUSCULAR | Status: DC
Start: 2020-11-23 — End: 2020-11-23

## 2020-11-23 MED ORDER — SENNOSIDES-DOCUSATE SODIUM 8.6-50 MG PO TABS: 1.0000 | ORAL_TABLET | Freq: Every evening | ORAL | Status: DC | PRN

## 2020-11-23 MED ORDER — TRAZODONE HCL 50 MG PO TABS
25.0000 mg | ORAL_TABLET | Freq: Every evening | ORAL | Status: DC | PRN
  Administered 2020-11-26: 50 mg via ORAL
  Filled 2020-11-23: qty 1

## 2020-11-23 MED ORDER — HEPARIN SODIUM (PORCINE) 5000 UNIT/ML IJ SOLN
5000.0000 [IU] | Freq: Three times a day (TID) | INTRAMUSCULAR | Status: DC
  Administered 2020-11-23 – 2020-12-04 (×33): 5000 [IU] via SUBCUTANEOUS
  Filled 2020-11-23 (×33): qty 1

## 2020-11-23 MED ORDER — ACETAMINOPHEN 325 MG PO TABS
325.0000 mg | ORAL_TABLET | ORAL | Status: DC | PRN
  Administered 2020-11-26: 650 mg via ORAL
  Filled 2020-11-23 (×2): qty 2

## 2020-11-23 MED ORDER — POLYETHYLENE GLYCOL 3350 17 G PO PACK: 17.0000 g | PACK | Freq: Every day | ORAL | Status: DC | PRN

## 2020-11-23 MED ORDER — MENTHOL 3 MG MT LOZG: 1.0000 | LOZENGE | OROMUCOSAL | Status: DC | PRN

## 2020-11-23 MED ORDER — DIPHENHYDRAMINE HCL 12.5 MG/5ML PO ELIX: 12.5000 mg | ORAL_SOLUTION | Freq: Four times a day (QID) | ORAL | Status: DC | PRN

## 2020-11-23 MED ORDER — FLEET ENEMA 7-19 GM/118ML RE ENEM: 1.0000 | ENEMA | Freq: Once | RECTAL | Status: DC | PRN

## 2020-11-23 NOTE — H&P (Addendum)
Physical Medicine and Rehabilitation Admission H&P    CC: Functional decline due to cervical myelopathy/cord compression.    HPI: Wayne Mckinney is a 55 year old male with history of HTN, macular degeneration with visual loss, multiple falls that started 3-4 weeks ago with BLE weakness and numbness as well as progressive balance deficits.  Work up revealed cervical spondylosis with C6/C7 disc osteophyte with disc protrusion and severe canal stenosis and cord compression with edema and/or myelomalacia, moderate canal stenosis C5/C6 and moderate end plate edema Z7/Q7 and C6/C7.  He was admitted on 11/19/20 for ACDF C5-C7 by Dr. Jake Samples. Post op to wear hard collar and therapy initiated. On 01/13, he has episode of unresponsiveness with therapy with eye rolling backwards, seizure type activity and loss of B/B control.  CT head negative for acute changes and low dose Keppra added due to concerns of seizure. Therapy ongoing and patient noted to have limitations due to ataxia with LLE>RLE weakness, numbness and balance deficits. CIR recommended due to functional decline. He is currently ambulating 250 feet MinA with PT and is expected to have a 5-7 day stay. Pain is currently well controlled with medication.    Review of Systems  Constitutional: Negative for chills and fever.  HENT: Positive for hearing loss.   Eyes: Negative for discharge.       Blind since 2007  Respiratory: Negative for cough and shortness of breath.   Cardiovascular: Negative for chest pain, palpitations and leg swelling.  Gastrointestinal: Positive for constipation. Negative for abdominal pain and nausea.  Genitourinary: Negative for dysuria and urgency.  Musculoskeletal: Positive for falls, joint pain (left greater than right knee), myalgias and neck pain.  Neurological: Positive for dizziness, sensory change (numbness/tingling BLE) and weakness. Negative for speech change and headaches.  Psychiatric/Behavioral: The patient  is not nervous/anxious and does not have insomnia.      Past Medical History:  Diagnosis Date  . Hypertension   . Legally blind 2007   due to macular degeneration     Past Surgical History:  Procedure Laterality Date  . ANTERIOR CERVICAL DECOMP/DISCECTOMY FUSION N/A 11/19/2020   Procedure: ANTERIOR CERVICAL DECOMPRESSION FUSION CERVICAL FIVE-SIX, CERVICAL SIX-SEVEN;  Surgeon: Dawley, Alan Mulder, DO;  Location: [facility] OR;  Service: Neurosurgery;  Laterality: N/A;  anterior  . NO PAST SURGERIES      Family History  Problem Relation Age of Onset  . Cataracts Father      Social History:  Wayne Mckinney is legally blind. They use [employer] for transportation--no local family. 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 hreports that he has quit smoking. He has quit using smokeless tobacco. He reports current alcohol use of about 3 beers and couple of mixed drinks per day. He reports that he does not use drugs.    Allergies: No Known Allergies    Medications Prior to Admission  Medication Sig Dispense Refill  . amLODipine (NORVASC) 10 MG tablet Take 10 mg by mouth daily.    . hydrochlorothiazide (MICROZIDE) 12.5 MG capsule Take 12.5 mg by mouth daily.    Marland Kitchen HYDROcodone-acetaminophen (NORCO/VICODIN) 5-325 MG tablet Take 1 tablet by mouth every 4 (four) hours as needed for moderate pain ((score 4 to 6)). 30 tablet 0  . levETIRAcetam (KEPPRA) 500 MG tablet Take 1 tablet (500 mg total) by mouth 2 (two) times daily. 60 tablet 6  . methocarbamol (ROBAXIN) 500 MG tablet Take 1 tablet (500 mg total)  by mouth every 6 (six) hours as needed for muscle spasms. 60 tablet 2    Drug Regimen Review  Drug regimen was reviewed and remains appropriate with no significant issues identified  Home: Home Living Family/patient expects to be discharged to:: Private residence Living Arrangements: Spouse/significant other Available Help at Discharge:  Family,Available PRN/intermittently Type of Home: Apartment Home Access: Stairs to enter Entrance Stairs-Number of Steps: 14 Entrance Stairs-Rails: Left Home Layout: Two level Alternate Level Stairs-Number of Steps: 14 Alternate Level Stairs-Rails: Left Bathroom Shower/Tub: Tub/shower unit Bathroom Toilet: Standard Home Equipment: Walker - standard (seeing eye cane) Additional Comments: pt has been sponge bathing with tub since 12/26. pt has an Surveyor, quantity walker/ friend let him borrow a wheelchair. 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 Good demo of log roll technique, increased time but no physical assist needed. Transfers Overall transfer level: Needs assistance Equipment used: Rolling walker (2 wheeled) Transfers: Sit to/from Stand Sit to Stand: Min assist Stand pivot transfers: Min assist General transfer comment: Assist to power to standing with cues for hand placement/technique. Stood from [employer], from chair x1. Ambulation/Gait Ambulation/Gait assistance: Min assist Gait Distance (Feet): 175 Feet Assistive device: Rolling walker (2 wheeled) Gait Pattern/deviations: Step-to pattern,Step-through pattern,Decreased stride length,Ataxic,Narrow base of support General Gait Details: Slow, mildly unsteady step to gait with increased knee flexion throughout; able to progress to step through gait with cues. Assist for directional cues and balance. No scissoring today but mildly ataxic with narrow BoS. Gait velocity: Decreased Gait  velocity interpretation: <1.31 ft/sec, indicative of household ambulator  ADL: ADL Overall ADL's : Needs assistance/impaired Eating/Feeding: Set up Grooming: Minimal assistance,Standing Upper Body Bathing: Minimal assistance,Bed level Lower Body Bathing: Maximal assistance Upper Body Dressing Details (indicate cue type and reason): total (A) with brace General ADL Comments: pt completed sit<>Stand from bed surface for the first time with max v/c  Cognition: Cognition Overall Cognitive Status: Within Functional Limits for tasks assessed Orientation Level: Oriented X4 Cognition Arousal/Alertness: Awake/alert Behavior During Therapy: WFL for tasks assessed/performed Overall Cognitive Status: Within Functional Limits for tasks assessed  Height 6\' 2"  (1.88 m). Physical Exam Gen: no distress, normal appearing HEENT: Immobilized with [place] J collar. Anterior incision with honeycomb dressings.  Cardio: Reg rate Chest: normal effort, normal rate of breathing Abd: soft, non-distended Ext: no edema Psych: pleasant, normal affect Skin: intact Musculoskeletal:        General: Swelling present.     Comments: Left knee with effusion and skin changes due to resolving ecchymosis? Tender on medial joint line. Right knee with minimal edema.   Neurological:     Mental Status: He is alert and oriented to person, place, and time.     No results found for this or any previous visit (from the past 48 hour(s)). No results found.     Medical Problem List and Plan: 1.  Impaired mobility and ADLs secondary to cervical myelopathy s/p ACDF  -patient may shower but spinal incision must be covered.   -ELOS/Goals: modI in 5-7 days 2.  Antithrombotics: -DVT/anticoagulation:  Pharmaceutical: Heparin  -antiplatelet  therapy: N/A 3. Pain Management: Hydrocodone prn seems to be effective. Will continue robaxin prn for back muscle spasms. Using approximately twice per day. Discussed decreasing  frequency to q6H prn. 4. Mood: LCSW to follow for evaluation and support.   -antipsychotic agents: N/A 5. Neuropsych: This patient is capable of making decisions on his own behalf. 6. Skin/Wound Care: Routine pressure relief measures. Continue surgical dressing for now.  7. Fluids/Electrolytes/Nutrition: Monitor I/O. Check lytes in am.  8. HTN: Monitor BP tid. Monitor for orthostatic changes. Currently under excellent control- continue Norvasc and Microzide. 9. Seizure prophylaxis: On keppra 500 mg bid-->should help with neuropathy also.  10. Constipation: Last received senna-docusate 1/12.  I have personally performed a face to face diagnostic evaluation, including, but not limited to relevant history and physical exam findings, of this patient and developed relevant assessment and plan.  Additionally, I have reviewed and concur with the physician assistant's documentation above.  Jacquelynn Cree, PA-C  Horton Chin, MD 11/23/2020

## 2020-11-23 NOTE — Progress Notes (Signed)
Subjective: Patient reports slow improvement in strength  Objective: Vital signs in last 24 hours: Temp:  [97.5 F (36.4 C)-97.9 F (36.6 C)] 97.6 F (36.4 C) (01/15 0754) Pulse Rate:  [61-72] 64 (01/15 0754) Resp:  [16-20] 17 (01/15 0754) BP: (116-146)/(75-84) 123/79 (01/15 0754) SpO2:  [97 %-100 %] 98 % (01/15 0754)  Intake/Output from previous day: 01/14 0701 - 01/15 0700 In: -  Out: 675 [Urine:675] Intake/Output this shift: No intake/output data recorded.  4+/5 BUE 4/5 BLE  Incision c/d/i Neck soft, trachea midline  Lab Results: No results for input(s): WBC, HGB, HCT, PLT in the last 72 hours. BMET No results for input(s): NA, K, CL, CO2, GLUCOSE, BUN, CREATININE, CALCIUM in the last 72 hours.  Studies/Results: CT HEAD WO CONTRAST  Result Date: 11/21/2020 CLINICAL DATA:  Possible seizure. EXAM: CT HEAD WITHOUT CONTRAST TECHNIQUE: Contiguous axial images were obtained from the base of the skull through the vertex without intravenous contrast. COMPARISON:  None. FINDINGS: Brain: No evidence of acute large vascular territory infarction, hemorrhage, hydrocephalus, extra-axial collection or mass lesion/mass effect. Mild bilateral prominence of the extra-axial spaces posteriorly. Mild patchy white matter hypoattenuation, most likely related to chronic microvascular ischemic disease. Vascular: Calcific atherosclerosis. Skull: No acute fracture. Sinuses/Orbits: Clear sinuses.  Unremarkable orbits. Other: No mastoid effusions. Soft tissue in bilateral external auditory canals, likely cerumen. IMPRESSION: No evidence of acute intracranial abnormality. Electronically Signed   By: Wayne Harts MD   On: 11/21/2020 15:49    Assessment/Plan: [name] s/p ACDFs for cervical myelopathy - likely to rehab today  Wayne Mckinney 55/15/2022, 11:43 AM

## 2020-11-23 NOTE — Progress Notes (Signed)
Occupational Therapy Treatment Patient Details Name: Wayne Mckinney MRN: [id number] DOB: [birth year] Today's Date: 11/23/2020    History of present illness Pt is a 55 y/o male with cord compression who is now s/p ACDF C5-7 on 11/19/20. PMH HTN legally blind   OT comments  OT treatment session with focus on self-care re-education, ADL transfers, and short-distance functional mobility with use of RW. Patient has met 3/3 short-term goals demonstrating functional tranfers with Min A, 3/3 grooming tasks standing at sink level with at least unilateral UE support on sink surface or RW and Min A, and ability to recall 3/3 cervical precautions without cueing. Patient continues to be motivated and would benefit from CIR placement. OT to continue to follow acutely.    Follow Up Recommendations  CIR    Equipment Recommendations  3 in 1 bedside commode;Other (comment)    Recommendations for Other Services Rehab consult    Precautions / Restrictions Precautions Precautions: Fall;Cervical Precaution Booklet Issued: Yes (comment) Precaution Comments: Reviewed precautions. Required Braces or Orthoses: Cervical Brace Cervical Brace: Hard collar;At all times Restrictions Weight Bearing Restrictions: No       Mobility Bed Mobility               General bed mobility comments: Patient seated in recliner upon entry.  Transfers Overall transfer level: Needs assistance Equipment used: Rolling walker (2 wheeled) Transfers: Sit to/from Stand Sit to Stand: Min assist         General transfer comment: Min A for sit to stand from low recliner. Good carryover of hand placement from previous sessions.    Balance Overall balance assessment: Needs assistance Sitting-balance support: No upper extremity supported;Feet supported Sitting balance-Leahy Scale: Fair     Standing balance support: During functional activity;Bilateral upper extremity supported Standing balance-Leahy Scale:  Poor Standing balance comment: Reliant on BUE on RW to maintain standing balance during functional tasks.                           ADL either performed or assessed with clinical judgement   ADL       Grooming: Minimal assistance;Standing                                       Vision       Perception     Praxis      Cognition Arousal/Alertness: Awake/alert Behavior During Therapy: WFL for tasks assessed/performed Overall Cognitive Status: Within Functional Limits for tasks assessed                                          Exercises     Shoulder Instructions       General Comments Clean, dry dressing at incision.    Pertinent Vitals/ Pain       Pain Assessment: No/denies pain Pain Intervention(s): Monitored during session  Home Living                                          Prior Functioning/Environment              Frequency  Min 3X/week        Progress Toward  Goals  OT Goals(current goals can now be found in the care plan section)  Progress towards OT goals: Progressing toward goals  Acute Rehab OT Goals Patient Stated Goal: Back to work, no more falls OT Goal Formulation: With patient Time For Goal Achievement: 12/04/20 Potential to Achieve Goals: Good ADL Goals Pt Will Perform Grooming: with modified independence;standing Pt Will Perform Upper Body Dressing: sitting;with modified independence Pt Will Perform Lower Body Dressing: with modified independence;sit to/from stand Pt Will Transfer to Toilet: with modified independence;ambulating;bedside commode Pt Will Perform Toileting - Clothing Manipulation and hygiene: with modified independence;sit to/from stand  Plan Discharge plan remains appropriate;Frequency remains appropriate    Co-evaluation                 AM-PAC OT "6 Clicks" Daily Activity     Outcome Measure   Help from another person eating meals?: A  Little Help from another person taking care of personal grooming?: A Little Help from another person toileting, which includes using toliet, bedpan, or urinal?: A Little Help from another person bathing (including washing, rinsing, drying)?: A Lot Help from another person to put on and taking off regular upper body clothing?: A Little Help from another person to put on and taking off regular lower body clothing?: A Little 6 Click Score: 17    End of Session Equipment Utilized During Treatment: Gait belt;Rolling walker;Cervical collar  OT Visit Diagnosis: Unsteadiness on feet (R26.81);Muscle weakness (generalized) (M62.81)   Activity Tolerance Patient tolerated treatment well   Patient Left in chair;with call bell/phone within reach   Nurse Communication Mobility status;Precautions        Time: 0710-0729 OT Time Calculation (min): 19 min  Charges: OT General Charges $OT Visit: 1 Visit OT Treatments $Self Care/Home Management : 8-22 mins  Zivah Mayr H. OTR/L Supplemental OT, Department of rehab services [phone]   Sila Sarsfield R H. 11/23/2020, 7:48 AM

## 2020-11-23 NOTE — Progress Notes (Signed)
Patient discharged to rehab -[id number] with all belongings at bedside. Patient alert and oriented, voiding adequately, with no c/o pain. Patient stated he will inform spouse of room number.

## 2020-11-23 NOTE — Progress Notes (Signed)
Inpatient Rehabilitation Medication Review by a Pharmacist  A complete drug regimen review was completed for this patient to identify any potential clinically significant medication issues.  Clinically significant medication issues were identified:  no  Check AMION for pharmacist assigned to patient if future medication questions/issues arise during this admission.  Pharmacist comments: no issues identified  Time spent performing this drug regimen review (minutes):  15  Wayne Mckinney, PharmD PGY-1 Pharmacy Resident 11/23/2020 2:03 PM Please see AMION for all pharmacy numbers

## 2020-11-23 NOTE — Progress Notes (Signed)
Physical Therapy Treatment Patient Details Name: Wayne Mckinney MRN: [id number] DOB: 04-Mar-1966 Today's Date: 11/23/2020    History of Present Illness Pt is a 55 y/o male with cord compression who is now s/p ACDF C5-7 on 11/19/20. PMH HTN legally blind    PT Comments    Patient progressing towards physical therapy goals. Min guard for sit to stand transfer with RW. Patient ambulated 250' with RW and minA for directional cues and balance due to mild ataxia and unsteadiness. Patient continues to demonstrate decreased activity tolerance and weakness. Continue to recommend comprehensive inpatient rehab (CIR) for post-acute therapy needs.     Follow Up Recommendations  CIR;Supervision for mobility/OOB     Equipment Recommendations  Rolling Catalena Stanhope with 5" wheels    Recommendations for Other Services       Precautions / Restrictions Precautions Precautions: Fall;Cervical Precaution Booklet Issued: Yes (comment) Precaution Comments: Reviewed precautions. Required Braces or Orthoses: Cervical Brace Cervical Brace: Hard collar;At all times Restrictions Weight Bearing Restrictions: No    Mobility  Bed Mobility               General bed mobility comments: Patient seated in recliner upon entry.  Transfers Overall transfer level: Needs assistance Equipment used: Rolling Mahdi Frye (2 wheeled) Transfers: Sit to/from Stand Sit to Stand: Min guard         General transfer comment: min guard for safety, no cues required for hand placement  Ambulation/Gait Ambulation/Gait assistance: Min assist Gait Distance (Feet): 250 Feet Assistive device: Rolling Anylah Scheib (2 wheeled) Gait Pattern/deviations: Step-through pattern;Decreased stride length;Ataxic;Narrow base of support Gait velocity: Decreased   General Gait Details: Increased knee flexion throughout and mildly unsteady. Assist for directional cues and balance. No scissoring noted but narrow BOS throughout ambulation   Stairs              Wheelchair Mobility    Modified Rankin (Stroke Patients Only)       Balance Overall balance assessment: Needs assistance Sitting-balance support: No upper extremity supported;Feet supported Sitting balance-Leahy Scale: Fair     Standing balance support: During functional activity;Bilateral upper extremity supported Standing balance-Leahy Scale: Poor Standing balance comment: Reliant on BUE on RW to maintain standing balance during functional tasks.                            Cognition Arousal/Alertness: Awake/alert Behavior During Therapy: WFL for tasks assessed/performed Overall Cognitive Status: Within Functional Limits for tasks assessed                                        Exercises      General Comments General comments (skin integrity, edema, etc.): Clean, dry dressing at incision.      Pertinent Vitals/Pain Pain Assessment: No/denies pain Pain Intervention(s): Monitored during session    Home Living                      Prior Function            PT Goals (current goals can now be found in the care plan section) Acute Rehab PT Goals Patient Stated Goal: Back to work, no more falls PT Goal Formulation: With patient Time For Goal Achievement: 12/04/20 Potential to Achieve Goals: Good Progress towards PT goals: Progressing toward goals    Frequency    Min 5X/week  PT Plan Current plan remains appropriate    Co-evaluation              AM-PAC PT "6 Clicks" Mobility   Outcome Measure  Help needed turning from your back to your side while in a flat bed without using bedrails?: A Little Help needed moving from lying on your back to sitting on the side of a flat bed without using bedrails?: A Little Help needed moving to and from a bed to a chair (including a wheelchair)?: A Little Help needed standing up from a chair using your arms (e.g., wheelchair or bedside chair)?: A Little Help  needed to walk in hospital room?: A Little Help needed climbing 3-5 steps with a railing? : A Lot 6 Click Score: 17    End of Session Equipment Utilized During Treatment: Gait belt;Cervical collar Activity Tolerance: Patient tolerated treatment well Patient left: in chair;with call bell/phone within reach Nurse Communication: Mobility status PT Visit Diagnosis: Unsteadiness on feet (R26.81);Other symptoms and signs involving the nervous system (R29.898);Pain;Repeated falls (R29.6)     Time: 9323-5573 PT Time Calculation (min) (ACUTE ONLY): 19 min  Charges:  $Therapeutic Activity: 8-22 mins                     Selmer Adduci A. Dan Humphreys PT, DPT Acute Rehabilitation Services Pager [phone] Office [phone]    Elissa Lovett 11/23/2020, 9:58 AM

## 2020-11-24 ENCOUNTER — Inpatient Hospital Stay (HOSPITAL_COMMUNITY): Payer: BC Managed Care – PPO | Admitting: Occupational Therapy

## 2020-11-24 ENCOUNTER — Inpatient Hospital Stay (HOSPITAL_COMMUNITY): Payer: BC Managed Care – PPO | Admitting: Physical Therapy

## 2020-11-24 LAB — CBC WITH DIFFERENTIAL/PLATELET
Abs Immature Granulocytes: 0.01 10*3/uL (ref 0.00–0.07)
Basophils Absolute: 0 10*3/uL (ref 0.0–0.1)
Basophils Relative: 1 %
Eosinophils Absolute: 0.1 10*3/uL (ref 0.0–0.5)
Eosinophils Relative: 2 %
HCT: 40.8 % (ref 39.0–52.0)
Hemoglobin: 13.4 g/dL (ref 13.0–17.0)
Immature Granulocytes: 0 %
Lymphocytes Relative: 33 %
Lymphs Abs: 1.3 10*3/uL (ref 0.7–4.0)
MCH: 28.9 pg (ref 26.0–34.0)
MCHC: 32.8 g/dL (ref 30.0–36.0)
MCV: 88.1 fL (ref 80.0–100.0)
Monocytes Absolute: 0.3 10*3/uL (ref 0.1–1.0)
Monocytes Relative: 8 %
Neutro Abs: 2.1 10*3/uL (ref 1.7–7.7)
Neutrophils Relative %: 56 %
Platelets: 225 10*3/uL (ref 150–400)
RBC: 4.63 MIL/uL (ref 4.22–5.81)
RDW: 12.6 % (ref 11.5–15.5)
WBC: 3.8 10*3/uL — ABNORMAL LOW (ref 4.0–10.5)
nRBC: 0 % (ref 0.0–0.2)

## 2020-11-24 LAB — COMPREHENSIVE METABOLIC PANEL
ALT: 35 U/L (ref 0–44)
AST: 40 U/L (ref 15–41)
Albumin: 3.2 g/dL — ABNORMAL LOW (ref 3.5–5.0)
Alkaline Phosphatase: 102 U/L (ref 38–126)
Anion gap: 8 (ref 5–15)
BUN: 15 mg/dL (ref 6–20)
CO2: 30 mmol/L (ref 22–32)
Calcium: 9.4 mg/dL (ref 8.9–10.3)
Chloride: 100 mmol/L (ref 98–111)
Creatinine, Ser: 1.12 mg/dL (ref 0.61–1.24)
GFR, Estimated: >60 mL/min (ref >60)
Glucose, Bld: 106 mg/dL — ABNORMAL HIGH (ref 70–99)
Potassium: 3.7 mmol/L (ref 3.5–5.1)
Sodium: 138 mmol/L (ref 135–145)
Total Bilirubin: 0.7 mg/dL (ref 0.3–1.2)
Total Protein: 6.8 g/dL (ref 6.5–8.1)

## 2020-11-24 MED ORDER — POLYETHYLENE GLYCOL 3350 17 G PO PACK
17.0000 g | PACK | Freq: Every day | ORAL | Status: DC | PRN
  Administered 2020-11-25: 17 g via ORAL
  Filled 2020-11-24: qty 1

## 2020-11-24 NOTE — Plan of Care (Signed)
  Problem: RH Balance Goal: LTG Patient will maintain dynamic standing with ADLs (OT) Description: LTG:  Patient will maintain dynamic standing balance with assist during activities of daily living (OT)  Flowsheets (Taken 11/24/2020 1214) LTG: Pt will maintain dynamic standing balance during ADLs with: Supervision/Verbal cueing   Problem: Sit to Stand Goal: LTG:  Patient will perform sit to stand in prep for activites of daily living with assistance level (OT) Description: LTG:  Patient will perform sit to stand in prep for activites of daily living with assistance level (OT) Flowsheets (Taken 11/24/2020 1214) LTG: PT will perform sit to stand in prep for activites of daily living with assistance level: Supervision/Verbal cueing   Problem: RH Grooming Goal: LTG Patient will perform grooming w/assist,cues/equip (OT) Description: LTG: Patient will perform grooming with assist, with/without cues using equipment (OT) Flowsheets (Taken 11/24/2020 1214) LTG: Pt will perform grooming with assistance level of: Supervision/Verbal cueing   Problem: RH Bathing Goal: LTG Patient will bathe all body parts with assist levels (OT) Description: LTG: Patient will bathe all body parts with assist levels (OT) Flowsheets (Taken 11/24/2020 1214) LTG: Pt will perform bathing with assistance level/cueing: Supervision/Verbal cueing   Problem: RH Dressing Goal: LTG Patient will perform upper body dressing (OT) Description: LTG Patient will perform upper body dressing with assist, with/without cues (OT). Flowsheets (Taken 11/24/2020 1214) LTG: Pt will perform upper body dressing with assistance level of: Set up assist Goal: LTG Patient will perform lower body dressing w/assist (OT) Description: LTG: Patient will perform lower body dressing with assist, with/without cues in positioning using equipment (OT) Flowsheets (Taken 11/24/2020 1214) LTG: Pt will perform lower body dressing with assistance level of:  Supervision/Verbal cueing   Problem: RH Toileting Goal: LTG Patient will perform toileting task (3/3 steps) with assistance level (OT) Description: LTG: Patient will perform toileting task (3/3 steps) with assistance level (OT)  Flowsheets (Taken 11/24/2020 1214) LTG: Pt will perform toileting task (3/3 steps) with assistance level: Supervision/Verbal cueing   Problem: RH Toilet Transfers Goal: LTG Patient will perform toilet transfers w/assist (OT) Description: LTG: Patient will perform toilet transfers with assist, with/without cues using equipment (OT) Flowsheets (Taken 11/24/2020 1214) LTG: Pt will perform toilet transfers with assistance level of: Supervision/Verbal cueing   Problem: RH Tub/Shower Transfers Goal: LTG Patient will perform tub/shower transfers w/assist (OT) Description: LTG: Patient will perform tub/shower transfers with assist, with/without cues using equipment (OT) Flowsheets (Taken 11/24/2020 1214) LTG: Pt will perform tub/shower stall transfers with assistance level of: Supervision/Verbal cueing

## 2020-11-24 NOTE — Evaluation (Addendum)
Occupational Therapy Assessment and Plan  Patient Details  Name: Wayne Mckinney MRN: [id number] Date of Birth: 08-Jun-1966  OT Diagnosis: acute pain, blindness and low vision and muscle weakness (generalized) Rehab Potential: Rehab Potential (ACUTE ONLY): Good ELOS: 7-10 days   Today's Date: 11/24/2020 OT Individual Time: 6195-0932 and 1430-1530 OT Individual Time Calculation (min): 62 min and 60 min  Hospital Problem: Active Problems:   Cervical myelopathy [facility])   Past Medical History:  Past Medical History:  Diagnosis Date  . Hypertension   . Legally blind 2007   due to macular degeneration    Past Surgical History:  Past Surgical History:  Procedure Laterality Date  . ANTERIOR CERVICAL DECOMP/DISCECTOMY FUSION N/A 11/19/2020   Procedure: ANTERIOR CERVICAL DECOMPRESSION FUSION CERVICAL FIVE-SIX, CERVICAL SIX-SEVEN;  Surgeon: Dawley, Theodoro Doing, DO;  Location: [place];  Service: Neurosurgery;  Laterality: N/A;  anterior  . NO PAST SURGERIES      Assessment & Plan Clinical Impression:  Wayne Mckinney is a 55 year old male with history of HTN, macular degeneration with visual loss, multiple falls that started 3-4 weeks ago with BLE weakness and numbness as well as progressive balance deficits.  Work up revealed cervical spondylosis with C6/C7 disc osteophyte with disc protrusion and severe canal stenosis and cord compression with edema and/or myelomalacia, moderate canal stenosis C5/C6 and moderate end plate edema I7/T2 and C6/C7.  He was admitted on 11/20/19 for ACDF C5-C7 by Dr. Reatha Armour. Post op to wear hard collar and therapy initiated. On 01/13, he has episode of unresponsiveness with therapy with eye rolling backwards, seizure type activity and loss of B/B control.  CT head negative for acute changes and low dose Keppra added due to concerns of seizure. Therapy ongoing and patient noted to have limitations due to ataxia with LLE>RLE weakness, numbness and balance deficits. CIR recommended  due to functional decline. He is currently ambulating 250 feet MinA with PT and is expected to have a 5-7 day stay. Pain is currently well controlled with medication.   Patient currently requires min with basic self-care skills secondary to muscle weakness, decreased cardiorespiratoy endurance, blindness and decreased standing balance, decreased postural control and difficulty maintaining precautions.  Prior to hospitalization, patient could complete BADLs with modified independent .  Patient will benefit from skilled intervention to increase independence with basic self-care skills prior to discharge home with wife and support of hired aides.  Anticipate patient will require 24 hour supervision and follow up home health.  OT - End of Session Endurance Deficit: Yes Endurance Deficit Description: Needed seated rest after standing during session OT Assessment Rehab Potential (ACUTE ONLY): Good OT Barriers to Discharge: Lack of/limited family support OT Barriers to Discharge Comments: Pt planning to hire aides to provide supervision support with wife post hospitalization OT Patient demonstrates impairments in the following area(s): Balance;Safety;Vision;Endurance;Motor;Pain OT Basic ADL's Functional Problem(s): Grooming;Bathing;Dressing;Toileting OT Advanced ADL's Functional Problem(s): Laundry OT Transfers Functional Problem(s): Toilet;Tub/Shower OT Additional Impairment(s): None OT Plan OT Intensity: Minimum of 1-2 x/day, 45 to 90 minutes OT Frequency: 5 out of 7 days OT Duration/Estimated Length of Stay: 7-10 days OT Treatment/Interventions: Balance/vestibular training;Discharge planning;Pain management;Self Care/advanced ADL retraining;Therapeutic Activities;UE/LE Coordination activities;Therapeutic Exercise;Patient/family education;Functional mobility training;Disease mangement/prevention;Community reintegration;DME/adaptive equipment instruction;Neuromuscular re-education;Psychosocial  support;UE/LE Strength taining/ROM OT Self Feeding Anticipated Outcome(s): No goal OT Basic Self-Care Anticipated Outcome(s): Supervision OT Toileting Anticipated Outcome(s): Supervision OT Bathroom Transfers Anticipated Outcome(s): Supervision OT Recommendation Recommendations for Other Services: Therapeutic Recreation consult Therapeutic Recreation Interventions: Kitchen group;Outing/community reintergration;Stress management (leisure) Follow  Up Recommendations: Home health OT;24 hour supervision/assistance Equipment Recommended: To be determined   OT Evaluation Precautions/Restrictions  Precautions Precautions: Fall;Cervical Precaution Comments: Blind Required Braces or Orthoses: Cervical Brace Cervical Brace: Hard collar;Other (comment) (ok to doff in bed, when ambulating to the bathroom, and when showering per MD order) Vital Signs Therapy Vitals Pulse Rate: 63 BP: 134/75 Pain Pain Assessment Pain Scale: 0-10 Pain Score: 0-No pain Home Living/Prior Functioning Home Living Family/patient expects to be discharged to:: Private residence Living Arrangements: Spouse/significant other Available Help at Discharge: Family,Available PRN/intermittently,Other (Comment) (pt trying to piece together 24/7 for the first few months after hospitalization, planning to do some through accomodative services) Type of Home: Apartment Home Access: Stairs to enter Entrance Stairs-Number of Steps: 14 Bathroom Shower/Tub: Government social research officer Accessibility: Yes Additional Comments: Pt is planning to secure a ground level apartment prior to d/c home from [place] With: Spouse IADL History Homemaking Responsibilities: Yes (Pt reported cooking, vacuuming, doing the laundry, and taking out the trash PTA, splitting IADL responsibilities with spouse (who is legally blind). [employer] bus used for running errands and pt had an aide to help him grocery shop) Type of Occupation:  Works for [employer] Leisure and Hobbies: Listening to [place] Level of Independence: Independent with basic ADLs,Independent with homemaking with ambulation,Requires assistive device for independence (Pt reported being independent with in-home mobility but using white cane in the community) Driving: No Vision Baseline Vision/History:  (total blindness since 2007) Perception  Perception: Within Functional Limits Praxis Praxis: Intact Cognition Overall Cognitive Status: Within Functional Limits for tasks assessed Arousal/Alertness: Awake/alert Orientation Level: Person;Place;Situation Person: Oriented Place: Oriented Situation: Oriented Year: 2022 Month: January Day of Week: Correct Memory: Appears intact Immediate Memory Recall: Sock;Blue;Bed Memory Recall Sock: Without Cue Memory Recall Blue: Without Cue Memory Recall Bed: Without Cue Awareness: Appears intact Safety/Judgment: Appears intact Sensation Coordination Gross Motor Movements are Fluid and Coordinated: No Fine Motor Movements are Fluid and Coordinated: Yes Coordination and Movement Description: Wide base of support during functional standing, limited postural control in standing with pt tending to lean on sink for support Finger Nose Finger Test: Difficult to accurately assess due to blindness, ?more accuracy with the Lt hand vs Rt Motor  Motor Motor: Other (comment) Motor - Skilled Clinical Observations: Affected by B LE weakness  Trunk/Postural Assessment  Cervical Assessment Cervical Assessment: Exceptions to [facility] (n/a due to cervical precautions) Thoracic Assessment Thoracic Assessment: Within Functional Limits Lumbar Assessment Lumbar Assessment: Within Functional Limits Postural Control Postural Control: Deficits on evaluation (limited in standing)  Balance Balance Balance Assessed: Yes Dynamic Sitting Balance Dynamic Sitting - Balance Support: During  functional activity;Feet supported;Left upper extremity supported Dynamic Sitting - Level of Assistance: 5: Stand by assistance (threading LEs into pants) Dynamic Standing Balance Dynamic Standing - Balance Support: During functional activity;No upper extremity supported Dynamic Standing - Level of Assistance: 4: Min assist Dynamic Standing - Balance Activities: Lateral lean/weight shifting;Forward lean/weight shifting (LB dressing tasks) Extremity/Trunk Assessment RUE Assessment RUE Assessment: Within Functional Limits Active Range of Motion (AROM) Comments: WNL General Strength Comments: 4+/5 grossly LUE Assessment LUE Assessment: Within Functional Limits Active Range of Motion (AROM) Comments: WNL General Strength Comments: 4+/5 grossly  Care Tool Care Tool Self Care Eating   Eating Assist Level: Independent with assistive device Eating Assistive Level Comment: using tactile strategies  Oral Care    Oral Care Assist Level: Contact Guard/Toucning assist (standing)    Bathing  Body parts bathed by patient: Right arm;Left arm;Chest;Abdomen;Front perineal area;Buttocks;Right upper leg;Left upper leg;Face Body parts bathed by helper: Right lower leg;Left lower leg   Assist Level: Minimal Assistance - Patient > 75%    Upper Body Dressing(including orthotics)   What is the patient wearing?: Pull over shirt   Assist Level: Supervision/Verbal cueing    Lower Body Dressing (excluding footwear)   What is the patient wearing?: Underwear/pull up;Pants Assist for lower body dressing: Contact Guard/Touching assist    Putting on/Taking off footwear   What is the patient wearing?: Non-skid slipper socks;Shoes Assist for footwear: Maximal Assistance - Patient 25 - 49%       Care Tool Toileting Toileting activity Toileting Activity did not occur (Clothing management and hygiene only): N/A (no void or bm)          Toilet transfer          Refer to Care Plan for Long Term  Goals  SHORT TERM GOAL WEEK 1 OT Short Term Goal 1 (Week 1): LTGs=STGs due to ELOS  Recommendations for other services: Surveyor, mining group, Stress management and Outing/community reintegration   Skilled Therapeutic Intervention Skilled OT session completed with focus on initial evaluation, education on OT role/POC, and establishment of patient-centered goals.   Pt greeted in the recliner, in the middle of breakfast and wearing his aspen collar. He reported being premedicated for incisional pain. Agreeable to engage in tx. He completed bathing/dressing tasks sit<stand at the sink, ambulating short distance to chair in front of sink using RW with CGA. CGA for sit<stands and for dynamic standing balance. He required locating cues to find needed ADL items due to blindness. Assistance needed for footwear bilaterally due to LE weakness and limited functional ROM. Oral care/handwashing completed while standing with CGA. Pt then completed another short distance ambulatory transfer with CGA and RW back to his recliner. Left him with all needs within reach, in care of RN.  2nd Session 1:1 tx (60 min)  Pt greeted in the recliner, requesting to shave. Short distance ambulatory transfer completed with CGA using the RW to chair placed in front of sink. Supervision while he shaved with brace removed to ensure he stayed away from dressings over incision site. After aspen collar was donned again, pt completed simulated ambulatory transfers to elevated toilet and also to 3:1 placed in shower. Reviewed extensively tactile cues for pt to locate needed transfer surfaces, also encouraged him to take a photo of his shower at home so that we can guide him in regards to DME options for his bathroom. Pt remained sitting in the recliner at end of session, ice pack to manage Lt LE pain per request, call bell within reach.  ADL ADL Eating: Modified independent (using tactile strategies) Grooming: Contact  guard Where Assessed-Grooming: Standing at sink Upper Body Bathing: Supervision/safety Where Assessed-Upper Body Bathing: Sitting at sink Lower Body Bathing: Minimal assistance Where Assessed-Lower Body Bathing: Sitting at sink;Standing at sink Upper Body Dressing: Setup Where Assessed-Upper Body Dressing: Sitting at sink Lower Body Dressing: Minimal assistance Where Assessed-Lower Body Dressing: Sitting at sink;Standing at sink Toilet transfer: CGA ambulating with RW using 3:1 over toilet Walk in shower transfer: CGA ambulating with RW (using 3:1) Mobility   CGA sit<stand   Discharge Criteria: Patient will be discharged from OT if patient refuses treatment 3 consecutive times without medical reason, if treatment goals not met, if there is a change in medical status, if patient makes no progress towards goals  or if patient is discharged from hospital.  The above assessment, treatment plan, treatment alternatives and goals were discussed and mutually agreed upon: by patient  Skeet Simmer 11/24/2020, 9:07 AM

## 2020-11-24 NOTE — Progress Notes (Signed)
[place] PHYSICAL MEDICINE & REHABILITATION PROGRESS NOTE   Subjective/Complaints: Wayne Mckinney is ready for his first day of therapy He has no complaints.  ROS: pain is well controlled with medication.   Objective:   No results found. Recent Labs    11/24/20 0505  WBC 3.8*  HGB 13.4  HCT 40.8  PLT 225   Recent Labs    11/24/20 0505  NA 138  K 3.7  CL 100  CO2 30  GLUCOSE 106*  BUN 15  CREATININE 1.12  CALCIUM 9.4    Intake/Output Summary (Last 24 hours) at 11/24/2020 1454 Last data filed at 11/24/2020 1412 Gross per 24 hour  Intake 480 ml  Output 3040 ml  Net -2560 ml        Physical Exam: Vital Signs Blood pressure 137/87, pulse 71, temperature 97.6 F (36.4 C), temperature source Oral, resp. rate 15, height 6\' 2"  (1.88 m), SpO2 100 %. Gen: no distress, normal appearing HEENT: oral mucosa pink and moist, NCAT. Blind. Cardio: Reg rate Chest: normal effort, normal rate of breathing Abd: soft, non-distended Ext: no edema Psych: pleasant, normal affect Skin: intact Neuro: Musculoskeletal:        General: Swelling present.     Comments: Left knee with effusion and skin changes due to resolving ecchymosis? Tender on medial joint line. Right knee with minimal edema.   Neurological:     Mental Status: He is alert and oriented to person, place, and time.  5/5 strength in upper extremities. 4/5 strength in lower extremities. Sensation is intact.    Assessment/Plan: 1. Functional deficits which require 3+ hours per day of interdisciplinary therapy in a comprehensive inpatient rehab setting.  Physiatrist is providing close team supervision and 24 hour management of active medical problems listed below.  Physiatrist and rehab team continue to assess barriers to discharge/monitor patient progress toward functional and medical goals  Care Tool:  Bathing    Body parts bathed by patient: Right arm,Left arm,Chest,Abdomen,Front perineal area,Buttocks,Right  upper leg,Left upper leg,Face   Body parts bathed by helper: Right lower leg,Left lower leg     Bathing assist Assist Level: Minimal Assistance - Patient > 75%     Upper Body Dressing/Undressing Upper body dressing   What is the patient wearing?: Pull over shirt    Upper body assist Assist Level: Supervision/Verbal cueing    Lower Body Dressing/Undressing Lower body dressing      What is the patient wearing?: Underwear/pull up,Pants     Lower body assist Assist for lower body dressing: Contact Guard/Touching assist     Toileting Toileting Toileting Activity did not occur (Clothing management and hygiene only): N/A (no void or bm)  Toileting assist       Transfers Chair/bed transfer  Transfers assist     Chair/bed transfer assist level: Minimal Assistance - Patient > 75%     Locomotion Ambulation   Ambulation assist      Assist level: Minimal Assistance - Patient > 75% Assistive device: Walker-rolling Max distance: 150'   Walk 10 feet activity   Assist     Assist level: Minimal Assistance - Patient > 75% Assistive device: Walker-rolling   Walk 50 feet activity   Assist    Assist level: Minimal Assistance - Patient > 75% Assistive device: Walker-rolling    Walk 150 feet activity   Assist    Assist level: Minimal Assistance - Patient > 75% Assistive device: Walker-rolling    Walk 10 feet on uneven surface  activity  Assist Walk 10 feet on uneven surfaces activity did not occur: Safety/medical concerns         Wheelchair     Assist Will patient use wheelchair at discharge?: No             Wheelchair 50 feet with 2 turns activity    Assist            Wheelchair 150 feet activity     Assist          Blood pressure 137/87, pulse 71, temperature 97.6 F (36.4 C), temperature source Oral, resp. rate 15, height 6\' 2"  (1.88 m), SpO2 100 %.  Medical Problem List and Plan: 1.  Impaired mobility and ADLs  secondary to cervical myelopathy s/p ACDF             -patient may shower but spinal incision must be covered.              -ELOS/Goals: modI in 5-7 days  Initial CIR evaluations today. 2.  Antithrombotics: -DVT/anticoagulation:  Pharmaceutical: Heparin             -antiplatelet therapy: N/A 3. Pain Management: Hydrocodone prn seems to be effective. Will continue robaxin prn for back muscle spasms. Using approximately twice per day. Discussed decreasing frequency to q6H prn and patient is agreeable. 4. Mood: LCSW to follow for evaluation and support.              -antipsychotic agents: N/A 5. Neuropsych: This patient is capable of making decisions on his own behalf. 6. Skin/Wound Care: Routine pressure relief measures. Continue surgical dressing for now.  7. Fluids/Electrolytes/Nutrition: Monitor I/O. Electrolytes stable 1/16  8. HTN: Monitor BP tid. Monitor for orthostatic changes. Currently under excellent control- continue Norvasc and Microzide. 9. Seizure prophylaxis: Continue keppra 500 mg bid-->should help with neuropathy also.  10. Constipation: Last received senna-docusate 1/12. 11. Hypoalbuminemia: encourage high protein foods    LOS: 1 days A FACE TO FACE EVALUATION WAS PERFORMED  3/12 P Delorus Langwell 11/24/2020, 2:54 PM

## 2020-11-24 NOTE — Evaluation (Signed)
Physical Therapy Assessment and Plan  Patient Details  Name: Wayne Mckinney MRN: [id number] Date of Birth: 01/13/1966  PT Diagnosis: Abnormality of gait, Difficulty walking, Muscle weakness and Pain in joint Rehab Potential: Good ELOS: 7-10 days   Today's Date: 11/24/2020 PT Individual Time: 9211-9417 PT Individual Time Calculation (min): 83 min    Hospital Problem: Active Problems:   Cervical myelopathy [facility])   Past Medical History:  Past Medical History:  Diagnosis Date  . Hypertension   . Legally blind 2007   due to macular degeneration    Past Surgical History:  Past Surgical History:  Procedure Laterality Date  . ANTERIOR CERVICAL DECOMP/DISCECTOMY FUSION N/A 11/19/2020   Procedure: ANTERIOR CERVICAL DECOMPRESSION FUSION CERVICAL FIVE-SIX, CERVICAL SIX-SEVEN;  Surgeon: Dawley, Theodoro Doing, DO;  Location: [place];  Service: Neurosurgery;  Laterality: N/A;  anterior  . NO PAST SURGERIES      Assessment & Plan Clinical Impression:  Wayne Mckinney is a 55 year old male with history of HTN, macular degeneration with visual loss, multiple falls that started 3-4 weeks ago with BLE weakness and numbness as well as progressive balance deficits.  Work up revealed cervical spondylosis with C6/C7 disc osteophyte with disc protrusion and severe canal stenosis and cord compression with edema and/or myelomalacia, moderate canal stenosis C5/C6 and moderate end plate edema E0/C1 and C6/C7.  He was admitted on 11/20/19 for ACDF C5-C7 by Dr. Reatha Armour. Post op to wear hard collar and therapy initiated. On 01/13, he has episode of unresponsiveness with therapy with eye rolling backwards, seizure type activity and loss of B/B control.  CT head negative for acute changes and low dose Keppra added due to concerns of seizure. Therapy ongoing and patient noted to have limitations due to ataxia with LLE>RLE weakness, numbness and balance deficits. CIR recommended due to functional decline. He is currently  ambulating 250 feet MinA with PT and is expected to have a 5-7 day stay. Pain is currently well controlled with medication.  Patient transferred to CIR on 11/23/2020 .   Patient currently requires min with mobility secondary to muscle weakness and muscle joint tightness, decreased cardiorespiratoy endurance, decreased coordination, decreased vision due to legal blindness and decreased sitting balance, decreased standing balance, decreased postural control and decreased balance strategies.  Prior to hospitalization, patient was modified independent  with mobility and lived with Spouse in a [place] home.  Home access is 14Stairs to enter.  Patient will benefit from skilled PT intervention to maximize safe functional mobility, minimize fall risk and decrease caregiver burden for planned discharge home with intermittent assist.  Anticipate patient will benefit from follow up [facility] at discharge.  PT - End of Session Activity Tolerance: Tolerates 30+ min activity with multiple rests Endurance Deficit: Yes Endurance Deficit Description: frequent rest breaks during functional activity PT Assessment Rehab Potential (ACUTE/IP ONLY): Good PT Barriers to Discharge: Decreased caregiver support;Home environment access/layout PT Patient demonstrates impairments in the following area(s): Balance;Endurance;Motor;Pain;Safety;Sensory PT Transfers Functional Problem(s): Bed Mobility;Bed to Chair;Car;Furniture;Floor PT Locomotion Functional Problem(s): Ambulation;Wheelchair Mobility;Stairs PT Plan PT Intensity: Minimum of 1-2 x/day ,45 to 90 minutes PT Frequency: 5 out of 7 days PT Duration Estimated Length of Stay: 7-10 days PT Treatment/Interventions: Ambulation/gait training;Balance/vestibular training;Community reintegration;Discharge planning;DME/adaptive equipment instruction;Functional mobility training;Neuromuscular re-education;Pain management;Patient/family education;Psychosocial support;Stair  training;Therapeutic Activities;Therapeutic Exercise;UE/LE Strength taining/ROM;UE/LE Coordination activities PT Transfers Anticipated Outcome(s): Supervision PT Locomotion Anticipated Outcome(s): Supervision with LRAD PT Recommendation Follow Up Recommendations: Home health PT Patient destination: Home Equipment Recommended: Rolling walker with 5" wheels Equipment  Details: pt reports he owns RW and w/c   PT Evaluation Precautions/Restrictions Precautions Precautions: Fall;Cervical Precaution Booklet Issued:  (discussed precautions) Precaution Comments: Blind Required Braces or Orthoses: Cervical Brace Cervical Brace: Hard collar;Other (comment) (don in sitting, ok to doff in bed) Restrictions Weight Bearing Restrictions: No Home Living/Prior Functioning Home Living Available Help at Discharge: Family;Available PRN/intermittently;Other (Comment) (pt plans to hire aide during the day to assist) Type of Home: Apartment Home Access: Stairs to enter Entrance Stairs-Number of Steps: 14 Entrance Stairs-Rails: Left Home Layout: One level Additional Comments: Pt is planning to secure a ground level apartment prior to d/c home from [place] With: Spouse Prior Function Level of Independence: Independent with gait;Independent with transfers;Requires assistive device for independence  Able to Take Stairs?: Yes Driving: No 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 Vision/Perception  Vision - History Baseline Vision: Legally blind Patient Visual Report: No change from baseline Perception Perception: Within Functional Limits Praxis Praxis: Intact  Cognition Overall Cognitive Status: Within Functional Limits for tasks assessed Arousal/Alertness: Awake/alert Orientation Level: Oriented X4 Attention: Focused;Sustained Focused Attention: Appears intact Sustained Attention: Appears intact Memory: Appears intact Awareness: Appears intact Problem Solving: Appears  intact Safety/Judgment: Appears intact Sensation Sensation Light Touch: Impaired Detail Light Touch Impaired Details:  (reports N/T in distal LE) Proprioception: Impaired Detail Proprioception Impaired Details: Impaired LLE Coordination Gross Motor Movements are Fluid and Coordinated: No Fine Motor Movements are Fluid and Coordinated: Yes Coordination and Movement Description: impaired 2/2 due pain in knees and LE weakness Heel Shin Test: impaired/unable to fully assess due decreased LE flexibility and pain Motor  Motor Motor: Abnormal postural alignment and control Motor - Skilled Clinical Observations: impaired 2/2 pain and LE weakness  Trunk/Postural Assessment  Cervical Assessment Cervical Assessment: Exceptions to [facility] (cervical precautions) Thoracic Assessment Thoracic Assessment: Within Functional Limits Lumbar Assessment Lumbar Assessment: Within Functional Limits Postural Control Postural Control: Deficits on evaluation (impaired in standing)  Balance Static Sitting Balance Static Sitting - Balance Support: No upper extremity supported;Feet supported Static Sitting - Level of Assistance: 5: Stand by assistance Dynamic Sitting Balance Dynamic Sitting - Balance Support: No upper extremity supported;Feet supported;During functional activity Dynamic Sitting - Level of Assistance: 5: Stand by assistance Static Standing Balance Static Standing - Balance Support: Bilateral upper extremity supported;During functional activity Static Standing - Level of Assistance: 4: Min assist;5: Stand by assistance Dynamic Standing Balance Dynamic Standing - Balance Support: Bilateral upper extremity supported;During functional activity Dynamic Standing - Level of Assistance: 4: Min assist Extremity Assessment   RLE Assessment RLE Assessment: Exceptions to [facility] Passive Range of Motion (PROM) Comments: tight HS General Strength Comments: impaired, see below RLE Strength Right Hip Flexion:  4-/5 Right Knee Flexion: 5/5 Right Knee Extension: 4/5 Right Ankle Dorsiflexion: 5/5 Right Ankle Plantar Flexion: 5/5 LLE Assessment LLE Assessment: Exceptions to [facility] Passive Range of Motion (PROM) Comments: tight HS General Strength Comments: impaired, see below LLE Strength Left Hip Flexion: 3/5 Left Knee Flexion: 4/5 Left Knee Extension: 3/5 Left Ankle Dorsiflexion: 5/5 Left Ankle Plantar Flexion: 5/5  Care Tool Care Tool Bed Mobility Roll left and right activity   Roll left and right assist level: Supervision/Verbal cueing    Sit to lying activity   Sit to lying assist level: Supervision/Verbal cueing    Lying to sitting edge of bed activity   Lying to sitting edge of bed assist level: Supervision/Verbal cueing     Care Tool Transfers Sit to stand transfer   Sit to stand assist  level: Minimal Assistance - Patient > 75%    Chair/bed transfer   Chair/bed transfer assist level: Minimal Assistance - Patient > 75%     Physiological scientist transfer assist level: Minimal Assistance - Patient > 75%      Care Tool Locomotion Ambulation   Assist level: Minimal Assistance - Patient > 75% Assistive device: Walker-rolling Max distance: 150'  Walk 10 feet activity   Assist level: Minimal Assistance - Patient > 75% Assistive device: Walker-rolling   Walk 50 feet with 2 turns activity   Assist level: Minimal Assistance - Patient > 75% Assistive device: Walker-rolling  Walk 150 feet activity   Assist level: Minimal Assistance - Patient > 75% Assistive device: Walker-rolling  Walk 10 feet on uneven surfaces activity Walk 10 feet on uneven surfaces activity did not occur: Safety/medical concerns      Stairs   Assist level: Minimal Assistance - Patient > 75% Stairs assistive device: 2 hand rails Max number of stairs: 8 (3")  Walk up/down 1 step activity   Walk up/down 1 step (curb) assist level: Minimal Assistance - Patient > 75% Walk up/down 1  step or curb assistive device: 2 hand rails    Walk up/down 4 steps activity Walk up/down 4 steps assist level: Minimal Assistance - Patient > 75% Walk up/down 4 steps assistive device: 2 hand rails  Walk up/down 12 steps activity Walk up/down 12 steps activity did not occur: Safety/medical concerns      Pick up small objects from floor Pick up small object from the floor (from standing position) activity did not occur: Safety/medical concerns      Wheelchair Will patient use wheelchair at discharge?: No          Wheel 50 feet with 2 turns activity      Wheel 150 feet activity        Refer to Care Plan for Long Term Goals  SHORT TERM GOAL WEEK 1 PT Short Term Goal 1 (Week 1): =LTG due to ELOS  Recommendations for other services: None   Skilled Therapeutic Intervention Evaluation completed (see details above and below) with education on PT POC and goals and individual treatment initiated with focus on assessing functional transfers and mobility. Pt received seated in recliner in room, agreeable to PT session. No complaints of pain at rest, reports onset of B knee pain (L>R) with mobility. L knee noted to have some edema and tenderness to touch, pt reports frequent falls prior to admission with knee pain following falls. Per pt MRI of knees has been done. Provided ice pack to L knee at end of session for pain management. Sit to stand with min A to RW throughout session. Ambulation x 150 ft with RW and min A, narrow BOS, flexed knees during gait, and some dragging of L foot noted at times. Car transfer with min A with RW with cues for safe transfer technique. Ascend/descend 8 x 3" steps with 2 handrails, step-to gait pattern, min A for balance. Ascend/descend 4 x 6" steps with 2 handrails and min A for balance, step-to gait pattern. Pt requires frequent verbal and tactile cues for safety throughout session due to visual deficits. Pt returned to recliner in room at end of session, needs in  reach, chair alarm in place, ice pack to L knee.  Mobility Bed Mobility Bed Mobility: Rolling Right;Rolling Left;Supine to Sit;Sit to Supine Rolling Right: Supervision/verbal cueing Rolling Left: Supervision/Verbal  cueing Supine to Sit: Supervision/Verbal cueing Sit to Supine: Supervision/Verbal cueing Transfers Transfers: Sit to Stand;Stand Pivot Transfers Sit to Stand: Minimal Assistance - Patient > 75% Stand Pivot Transfers: Minimal Assistance - Patient > 75% Stand Pivot Transfer Details: Tactile cues for placement;Verbal cues for sequencing;Verbal cues for precautions/safety Transfer (Assistive device): Rolling walker Locomotion  Gait Gait Distance (Feet): 150 Feet Assistive device: Rolling walker Gait Gait Pattern: Impaired Knee - Stance Phase - Impaired Gait Pattern: Increased knee flexion - Left;Increased knee flexion - Right Ankle - Swing Phase- Impaired Gait Pattern: Decreased foot clearance - Left;Decreased foot clearance - Right;Increased time to advance - Left Gait velocity: decreased Stairs / Additional Locomotion Stairs: Yes Stairs Assistance: Minimal Assistance - Patient > 75% Stair Management Technique: Two rails;Step to pattern Number of Stairs: 8 Height of Stairs: 3 Wheelchair Mobility Wheelchair Mobility: No   Discharge Criteria: Patient will be discharged from PT if patient refuses treatment 3 consecutive times without medical reason, if treatment goals not met, if there is a change in medical status, if patient makes no progress towards goals or if patient is discharged from hospital.  The above assessment, treatment plan, treatment alternatives and goals were discussed and mutually agreed upon: by patient   Excell Seltzer, PT, DPT 11/24/2020, 1:03 PM

## 2020-11-24 NOTE — Progress Notes (Signed)
Patient noted sitting in his recliner in his room at the beginning of the shift. He c/o back pain & asked for his muscle relaxer. His meds were given as ordered. Later in the shift, he was assisted to bed. He has some edema to BLE & feet. Honeycomb dressing to the anterior neck is dry & intact. There was no c/o pain at that time. No acute distress noted. Will continue to monitor.

## 2020-11-25 ENCOUNTER — Inpatient Hospital Stay (HOSPITAL_COMMUNITY): Payer: BC Managed Care – PPO | Admitting: Physical Therapy

## 2020-11-25 ENCOUNTER — Inpatient Hospital Stay (HOSPITAL_COMMUNITY): Payer: BC Managed Care – PPO

## 2020-11-25 LAB — BASIC METABOLIC PANEL
Anion gap: 9 (ref 5–15)
BUN: 14 mg/dL (ref 6–20)
CO2: 28 mmol/L (ref 22–32)
Calcium: 9.5 mg/dL (ref 8.9–10.3)
Chloride: 100 mmol/L (ref 98–111)
Creatinine, Ser: 1.03 mg/dL (ref 0.61–1.24)
GFR, Estimated: >60 mL/min (ref >60)
Glucose, Bld: 102 mg/dL — ABNORMAL HIGH (ref 70–99)
Potassium: 3.7 mmol/L (ref 3.5–5.1)
Sodium: 137 mmol/L (ref 135–145)

## 2020-11-25 LAB — CBC
HCT: 40.8 % (ref 39.0–52.0)
Hemoglobin: 13.6 g/dL (ref 13.0–17.0)
MCH: 29.2 pg (ref 26.0–34.0)
MCHC: 33.3 g/dL (ref 30.0–36.0)
MCV: 87.7 fL (ref 80.0–100.0)
Platelets: 254 10*3/uL (ref 150–400)
RBC: 4.65 MIL/uL (ref 4.22–5.81)
RDW: 12.7 % (ref 11.5–15.5)
WBC: 3.6 10*3/uL — ABNORMAL LOW (ref 4.0–10.5)
nRBC: 0 % (ref 0.0–0.2)

## 2020-11-25 MED ORDER — DICLOFENAC SODIUM 1 % EX GEL
4.0000 g | Freq: Four times a day (QID) | CUTANEOUS | Status: DC
  Administered 2020-11-25 – 2020-12-04 (×35): 4 g via TOPICAL
  Filled 2020-11-25 (×2): qty 100

## 2020-11-25 NOTE — Care Management (Signed)
Inpatient Rehabilitation Center Individual Statement of Services  Patient Name:  Wayne Mckinney  Date:  11/25/2020  Welcome to the Inpatient Rehabilitation Center.  Our goal is to provide you with an individualized program based on your diagnosis and situation, designed to meet your specific needs.  With this comprehensive rehabilitation program, you will be expected to participate in at least 3 hours of rehabilitation therapies Monday-Friday, with modified therapy programming on the weekends.  Your rehabilitation program will include the following services:  Physical Therapy (PT), Occupational Therapy (OT), 24 hour per day rehabilitation nursing, Therapeutic Recreaction (TR), Psychology, Neuropsychology, Care Coordinator, Rehabilitation Medicine, Nutrition Services, Pharmacy Services and Other  Weekly team conferences will be held on Tuesdays to discuss your progress.  Your Inpatient Rehabilitation Care Coordinator will talk with you frequently to get your input and to update you on team discussions.  Team conferences with you and your family in attendance may also be held.  Expected length of stay: 7-10 days   Overall anticipated outcome: Supervision  Depending on your progress and recovery, your program may change. Your Inpatient Rehabilitation Care Coordinator will coordinate services and will keep you informed of any changes. Your Inpatient Rehabilitation Care Coordinator's name and contact numbers are listed  below.  The following services may also be recommended but are not provided by the Inpatient Rehabilitation Center:   Driving Evaluations  Home Health Rehabiltiation Services  Outpatient Rehabilitation Services  Vocational Rehabilitation   Arrangements will be made to provide these services after discharge if needed.  Arrangements include referral to agencies that provide these services.  Your insurance has been verified to be:  BCBS  Your primary doctor is:  Fleet Contras  Pertinent information will be shared with your doctor and your insurance company.  Inpatient Rehabilitation Care Coordinator:  Susie Cassette [phone] or [phone]  Information discussed with and copy given to patient by: Gretchen Short, 11/25/2020, 8:31 AM

## 2020-11-25 NOTE — Progress Notes (Signed)
Physical Therapy Session Note  Patient Details  Name: Wayne Mckinney MRN: [id number] Date of Birth: [birth year]  Today's Date: 11/25/2020 PT Individual Time: 1430-1530 PT Individual Time Calculation (min): 60 min   Short Term Goals: Week 1:  PT Short Term Goal 1 (Week 1): =LTG due to ELOS  Skilled Therapeutic Interventions/Progress Updates:    Pt received seated in recliner. Pt ambulated 200 ft and 100 ft x 2 with CGA and RW. VCs to stand tall, increase step width, and increase heel strike.  Pt performed 3 bouts on the nu step at level 10, 7 min, 2 min, 1 min with instruction to stop when he reached 8/10 exertion level and resuming at 4/10. Pt reported that the nustep feels like good exercise and doesn't "twinge my knee like walking." Pt performed step ups on 6" step with R leg, but was unable to step up with his LLE. Instead he performed elevated lunges to prepare for stepping up with that leg. Pt had difficulty fully extending his RLE on the stair, but was successful with VC and TCs. Stated it felt like it was stretching his leg. Pt expressed fatigue "like I went to the gym" after the session. Pt returned to recliner after session and was left with all needs within reach.   Therapy Documentation Precautions:  Precautions Precautions: Fall,Cervical Precaution Booklet Issued:  (discussed precautions) Precaution Comments: Blind Required Braces or Orthoses: Cervical Brace Cervical Brace: Hard collar,Other (comment) (don in sitting, ok to doff in bed) Restrictions Weight Bearing Restrictions: No    Therapy/Group: Individual Therapy  Lauro Manlove,SPT 11/25/2020, 3:59 PM

## 2020-11-25 NOTE — Progress Notes (Signed)
Inpatient Rehabilitation  Patient information reviewed and entered into eRehab system by Alvenia Treese M. Glynis Hunsucker, M.A., CCC/SLP, PPS Coordinator.  Information including medical coding, functional ability and quality indicators will be reviewed and updated through discharge.    

## 2020-11-25 NOTE — Progress Notes (Signed)
[place] PHYSICAL MEDICINE & REHABILITATION PROGRESS NOTE   Subjective/Complaints:  Pt reports B/L L>R knee pain- also swollen-  Also feels like speech is "lisping" slightly- asking when that usually improves.   LBM 2 days ago, but denies constipation.  Has a crook in neck from sleeping "wrong" last night- is better after pain meds/muscle relaxer somewhat.   Also tingling in feet and numbness in legs is better/not resolved since neck surgery.   ROS:  Pt denies SOB, abd pain, CP, N/V/C/D, and vision changes  Objective:   No results found. Recent Labs    11/24/20 0505 11/25/20 0548  WBC 3.8* 3.6*  HGB 13.4 13.6  HCT 40.8 40.8  PLT 225 254   Recent Labs    11/24/20 0505 11/25/20 0548  NA 138 137  K 3.7 3.7  CL 100 100  CO2 30 28  GLUCOSE 106* 102*  BUN 15 14  CREATININE 1.12 1.03  CALCIUM 9.4 9.5    Intake/Output Summary (Last 24 hours) at 11/25/2020 1012 Last data filed at 11/25/2020 0545 Gross per 24 hour  Intake 720 ml  Output 1600 ml  Net -880 ml        Physical Exam: Vital Signs Blood pressure 131/75, pulse 63, temperature 97.7 F (36.5 C), resp. rate 16, height 6\' 2"  (1.88 m), SpO2 98 %. Gen: awake, blind, sitting up in bedside chair, PT in room, NAD HEENT: oral mucosa pink and moist, NCAT. Blind from macular degeneration. Cardio: RRR Chest: CTA B/L- no W/R/R- good air movement Abd: Soft, NT, ND, (+)BS  Ext: no edema Psych: appropriate Skin: intact Neuro: Musculoskeletal:        General: Swelling present.     Comments: Left knee with mild ot moderate effusion and skin changes due to resolving ecchymosis? Tender on medial joint line and tibial plateau. Right knee with minimal edema but also TTP on tibial plateau and medial joint line   Neurological: Ox3   5/5 strength in upper extremities. 4/5 strength in lower extremities. Sensation is intact.    Assessment/Plan: 1. Functional deficits which require 3+ hours per day of interdisciplinary  therapy in a comprehensive inpatient rehab setting.  Physiatrist is providing close team supervision and 24 hour management of active medical problems listed below.  Physiatrist and rehab team continue to assess barriers to discharge/monitor patient progress toward functional and medical goals  Care Tool:  Bathing    Body parts bathed by patient: Right arm,Left arm,Chest,Abdomen,Front perineal area,Buttocks,Right upper leg,Left upper leg,Face   Body parts bathed by helper: Right lower leg,Left lower leg     Bathing assist Assist Level: Minimal Assistance - Patient > 75%     Upper Body Dressing/Undressing Upper body dressing   What is the patient wearing?: Pull over shirt    Upper body assist Assist Level: Supervision/Verbal cueing    Lower Body Dressing/Undressing Lower body dressing      What is the patient wearing?: Underwear/pull up,Pants     Lower body assist Assist for lower body dressing: Contact Guard/Touching assist     Toileting Toileting Toileting Activity did not occur (Clothing management and hygiene only): N/A (no void or bm)  Toileting assist Assist for toileting: Independent with assistive device Assistive Device Comment:  (urinal)   Transfers Chair/bed transfer  Transfers assist     Chair/bed transfer assist level: Minimal Assistance - Patient > 75%     Locomotion Ambulation   Ambulation assist      Assist level: Minimal Assistance - Patient >  75% Assistive device: Walker-rolling Max distance: 150'   Walk 10 feet activity   Assist     Assist level: Minimal Assistance - Patient > 75% Assistive device: Walker-rolling   Walk 50 feet activity   Assist    Assist level: Minimal Assistance - Patient > 75% Assistive device: Walker-rolling    Walk 150 feet activity   Assist    Assist level: Minimal Assistance - Patient > 75% Assistive device: Walker-rolling    Walk 10 feet on uneven surface  activity   Assist Walk 10  feet on uneven surfaces activity did not occur: Safety/medical concerns         Wheelchair     Assist Will patient use wheelchair at discharge?: No             Wheelchair 50 feet with 2 turns activity    Assist            Wheelchair 150 feet activity     Assist          Blood pressure 131/75, pulse 63, temperature 97.7 F (36.5 C), resp. rate 16, height 6\' 2"  (1.88 m), SpO2 98 %.  Medical Problem List and Plan: 1.  Impaired mobility and ADLs secondary to cervical myelopathy s/p ACDF             -patient may shower but spinal incision must be covered.              -ELOS/Goals: modI in 5-7 days  Initial CIR evaluations today. 2.  Antithrombotics: -DVT/anticoagulation:  Pharmaceutical: Heparin             -antiplatelet therapy: N/A 3. Pain Management: Hydrocodone prn seems to be effective. Will continue robaxin prn for back muscle spasms. Using approximately twice per day. Discussed decreasing frequency to q6H prn and patient is agreeable. 4. Mood: LCSW to follow for evaluation and support.              -antipsychotic agents: N/A 5. Neuropsych: This patient is capable of making decisions on his own behalf. 6. Skin/Wound Care: Routine pressure relief measures. Continue surgical dressing for now.  7. Fluids/Electrolytes/Nutrition: Monitor I/O. Electrolytes stable 1/16  8. HTN: Monitor BP tid. Monitor for orthostatic changes. Currently under excellent control- continue Norvasc and Microzide. 9. Seizure prophylaxis: Continue keppra 500 mg bid-->should help with neuropathy also.   1/17- also c/o lisp- explained can take up to 2 months or so to go away sometimes.  10. Constipation: Last received senna-docusate 1/12.  1/17- LBM 2 days ago- denies constipation 11. Hypoalbuminemia: encourage high protein foods  12. B/L L>R knee pain  1/17- will order voltaren gel QID for now- doesn't want surgery- since on SQ Heparin, wary to start NSAID for bleeding risk.    LOS: 2 days A FACE TO FACE EVALUATION WAS PERFORMED  Yuliza Cara 11/25/2020, 10:12 AM

## 2020-11-25 NOTE — Progress Notes (Signed)
Occupational Therapy Session Note  Patient Details  Name: Wayne Mckinney MRN: [id number] Date of Birth: January 27, 1966  Today's Date: 11/25/2020 OT Individual Time: 1030-1130 OT Individual Time Calculation (min): 60 min    Short Term Goals: Week 1:  OT Short Term Goal 1 (Week 1): LTGs=STGs due to ELOS  Skilled Therapeutic Interventions/Progress Updates:    Pt resting in recliner upon arrival and agreeable to therapy. Pt declined shower and completed UB bathing/dressing seated in w/c at sink.  W/c placed at sink and directional orientation provided to pt to faciliate pt amb with RW to w/c. Pt required min verbal cues for course correction. Supplies provided and verbal cues provided for orientation of supplies on sink. Pt completed UB bathing/dressing tasks with supervision. Discussed bathroom layout at home. Pt does not live in accessible apartment. Described TTB and function. Pt agreeable to "trying it out" during tomorrow's therapy. Pt states he has a std walker at home and does not particularly like a RW but can use it while in hospital. Pt amb with RW 5' to recliner with min verbal cues for orientation and direction. Pt remained in recliner with ice applied to Lt knee and all needs within reach.   Therapy Documentation Precautions:  Precautions Precautions: Fall,Cervical Precaution Booklet Issued:  (discussed precautions) Precaution Comments: Blind Required Braces or Orthoses: Cervical Brace Cervical Brace: Hard collar,Other (comment) (don in sitting, ok to doff in bed) Restrictions Weight Bearing Restrictions: No   Pain:  Pt c/o Lt medial knee pain (unrated); ice applied at end of session   Therapy/Group: Individual Therapy  Rich Brave 11/25/2020, 11:58 AM

## 2020-11-25 NOTE — Progress Notes (Signed)
Physical Therapy Session Note  Patient Details  Name: Wayne Mckinney MRN: [id number] Date of Birth: 09-04-1966  Today's Date: 11/25/2020 PT Individual Time: 0800-0915 PT Individual Time Calculation (min): 75 min   Short Term Goals: Week 1:  PT Short Term Goal 1 (Week 1): =LTG due to ELOS  Skilled Therapeutic Interventions/Progress Updates:  Pt received seated in recliner in room, agreeable to PT session. Pt reports ongoing pain in L knee > R knee with mobility, utilized repositioning and ice pack at end of session for pain management. Sit to stand with CGA to RW throughout session. Stand pivot transfer with RW and CGA throughout session. Pt able to perform oral hygiene while seated in w/c at sink with setup A. Dependent transport via w/c to/from therapy gym for time conservation. Ambulation x 150 ft, x 50 ft with RW and min A with directional cues as well as cues for upright posture, increasing BOS, and heel strike during gait. Pt reports increase in L knee pain with gait. Sit to supine min A for LLE management. Performed McMurrays test to L knee to determine if pt has meniscal injury, test negative. Performed superior/inferior and lateral/medial patellar mobilizations to L knee, found to have decreased patellar mobility as well as swelling at medial aspect of joint and tenderness to touch. R patella also exhibits decreased mobility but not to the degree that L patella exhibits. Pt returned to sitting EOM with min A for LLE management due to pain. Seated BLE HS curls and LAQ x 10 reps each to improve knee ROM. Pt exhibits some guarding of L knee especially with extension. Pt returned to w/c then returned to recliner in room at end of session. Elevated BLE in recliner, provided wedge under B knees for improved comfort, and provided ice pack to L knee. Pt left seated in recliner with needs in reach, chair alarm in place at end of session.   Therapy Documentation Precautions:  Precautions Precautions:  Fall,Cervical Precaution Booklet Issued:  (discussed precautions) Precaution Comments: Blind Required Braces or Orthoses: Cervical Brace Cervical Brace: Hard collar,Other (comment) (don in sitting, ok to doff in bed) Restrictions Weight Bearing Restrictions: No    Therapy/Group: Individual Therapy   Peter Congo, PT, DPT  11/25/2020, 11:30 AM

## 2020-11-26 ENCOUNTER — Inpatient Hospital Stay (HOSPITAL_COMMUNITY): Payer: BC Managed Care – PPO | Admitting: Physical Therapy

## 2020-11-26 ENCOUNTER — Inpatient Hospital Stay (HOSPITAL_COMMUNITY): Payer: BC Managed Care – PPO

## 2020-11-26 MED ORDER — SORBITOL 70 % SOLN
30.0000 mL | Freq: Once | Status: AC
  Administered 2020-11-27: 30 mL via ORAL
  Filled 2020-11-26: qty 30

## 2020-11-26 NOTE — Patient Care Conference (Signed)
Inpatient RehabilitationTeam Conference and Plan of Care Update Date: 11/26/2020   Time: 11:24 AM    Patient Name: Wayne Mckinney      Medical Record Number: [id number]  Date of Birth: May 23, 1966 Sex: Male         Room/Bed: 4W11C/4W11C-01 Payor Info: Payor: BLUE CROSS BLUE SHIELD / Plan: BCBS COMM PPO / Product Type: *No Product type* /    Admit Date/Time:  11/23/2020  1:35 PM  Primary Diagnosis:  Cervical myelopathy [facility])  Hospital Problems: Principal Problem:   Cervical myelopathy [facility]    Expected Discharge Date: Expected Discharge Date: 12/04/20  Team Members Present: Physician leading conference: Dr. Genice Rouge Care Coodinator Present: Cecile Sheerer, LCSWA;Dovey Fatzinger Marlyne Beards, RN, BSN, CRRN Nurse Present: Chana Bode, RN PT Present: Peter Congo, PT OT Present: Ardis Rowan, COTA;Jennifer Katrinka Blazing, OT PPS Coordinator present : Fae Pippin, [place], PT     Current Status/Progress Goal Weekly Team Focus  Bowel/Bladder   Patient is continent both B/B. LBM 11/25/20  Patient will remain continent with normal B/B patern  Toilet patient PRN on every shift.   Swallow/Nutrition/ Hydration             ADL's   bathing/dressing-CGA/min A; functional transfers-min A/CGA for navigation with min verbal cues  supervision overall  activity tolerance, standing balance, educaiton, safety awareness,   Mobility   min A bed mobility, CGA transfers with RW, CGA to min A gait up to 200 ft with RW, CGA stairs  Supervision overall  endurance, balance, LE strengthening, LE NMR   Communication             Safety/Cognition/ Behavioral Observations            Pain   Patient's pain is curently manage well with PRN Tylenol and Oxycodone  Assess patient for pain on every shift and PRN  Notify MD if pain is not relieved by current PRN medications.   Skin   Spinal incision appears clean, dry and intact with no signs of infection. Skin is intact  Patient skin will remain intact with no  infection  Assess skin and incision site for signs of infection and skin breakdown on every shift and PRN     Discharge Planning:  Pt to be assessed. Per EMR, pt to d/c to home with his wife; she is legally blind but works FT and will be taking FMLA.   Team Discussion: Had a seizure downstairs before admit to rehab. Not taking pain medications as often as he could. Both patient and wife are legally blind. Voltaren gel is working well and he feels he doesn't need pain medication. He is continent B/B. Honeycomb dressing was removed and steri strips are in place. Wearing [place] J collar. Patient on target to meet rehab goals: He is min assist to contact guard for mobility. Complains of pain in the knees but the Voltaren gel works well. Min assist to contact guard for ADL's, set-up for bathing and dressing. Give orientation and cues. Ambulating up to 200' with RW.   *See Care Plan and progress notes for long and short-term goals.   Revisions to Treatment Plan:  MD assessing daily labs.  Teaching Needs: Family education, weight bearing precautions, medication management, wound/skin care.  Current Barriers to Discharge: Inaccessible home environment, Decreased caregiver support, Home enviroment access/layout, Wound care, Lack of/limited family support, Weight bearing restrictions and Behavior  Possible Resolutions to Barriers: Continue current medications, provide emotional support to patient and family.     Medical  Summary Current Status: B/L knee pain- voltaren gel is working- refusing pain meds regularly- N/T is slightly improved; continent LBM yesterday- doesn't need sorbitol  Barriers to Discharge: Decreased family/caregiver support;Home enviroment access/layout;Weight bearing restrictions;Medical stability;Wound care;Other (comments);Weight  Barriers to Discharge Comments: wearing [place] J collar-walking 239ft RW- lot of knee pain- voltaren gel helping knee pain- both pt and wife legally  blind- low weight- completely blind Possible Resolutions to [employer] Focus: goals Supervision-  making good progress- 1/26 Wednesday d/c.   Continued Need for Acute Rehabilitation Level of Care: The patient requires daily medical management by a physician with specialized training in physical medicine and rehabilitation for the following reasons: Direction of a multidisciplinary physical rehabilitation program to maximize functional independence : Yes Medical management of patient stability for increased activity during participation in an intensive rehabilitation regime.: Yes Analysis of laboratory values and/or radiology reports with any subsequent need for medication adjustment and/or medical intervention. : Yes   I attest that I was present, lead the team conference, and concur with the assessment and plan of the team.   Tennis Must 11/26/2020, 6:43 PM

## 2020-11-26 NOTE — Progress Notes (Signed)
Occupational Therapy Session Note  Patient Details  Name: Wayne Mckinney MRN: [id number] Date of Birth: [birth year]  Today's Date: 11/26/2020 OT Individual Time: 1000-1055 OT Individual Time Calculation (min): 55 min    Short Term Goals: Week 1:  OT Short Term Goal 1 (Week 1): LTGs=STGs due to ELOS  Skilled Therapeutic Interventions/Progress Updates:    Pt resting recliner upon arrival. Pt requested to bathe at sink and change shirts. Amb with RW in room to sink with verbal cues for orientation of sink and w/c. Pt completed bathing/dressing with supervision. Pt transported to tub room for time management. Pt practiced TTB transfers with CGA and max verbal cues-scoot transfer. Pt will have to amb with RW into bathroom at home. Pt returned to room and tranfserred back to recliner with CGA/supervision. Pt remained in recliner with all needs within reach.   Therapy Documentation Precautions:  Precautions Precautions: Fall,Cervical Precaution Booklet Issued:  (discussed precautions) Precaution Comments: Blind Required Braces or Orthoses: Cervical Brace Cervical Brace: Hard collar,Other (comment) (don in sitting, ok to doff in bed) Restrictions Weight Bearing Restrictions: No   Pain:  Pt c/o increased L medial knee pain after earlier PT session; ice applied and repositioned   Therapy/Group: Individual Therapy  Rich Brave 11/26/2020, 11:01 AM

## 2020-11-26 NOTE — Progress Notes (Signed)
Patient ID: Wayne Mckinney, male   DOB: Jun 23, 1966, 55 y.o.   MRN: [id number]  SW met with pt in room to provide updates from team conference, and d/c date 1/26. SW completed assessment with pt. Pt reports that his wife has an upcoming surgery on Friday in which she will stay with family following the surgery and then will return on Monday. He will discuss with his wife transportation and bringing in a wheelchair. Pt inquired about [place] services. SW explained challenges with obtaining HH with his insurance but SW will make an effort. Pt aware SW will follow-up with his wife to discuss further.  *SW spoke with pt wife Wayne Mckinney [phone]) to discuss above. Pt aunt was present during call as well. Request for [facility] therapies due to physical limitations reported. SW indicated will put in request with medical team. SW also shared with possible challenges with obtaining HH therapies due to primary insurance. Also requested a letter for their employer to support pt will need care at discharge. Transportation home on Wednesday TBD as they have steps to get into their 2nd floor apartment. SW informed medical team on above.   Loralee Pacas, MSW, [place] Office: [phone] Cell: [phone] Fax: [phone]

## 2020-11-26 NOTE — Progress Notes (Signed)
Physical Therapy Session Note  Patient Details  Name: Wayne Mckinney MRN: [id number] Date of Birth: Nov 22, 1965  Today's Date: 11/26/2020 PT Individual Time: 0800-0900, 1400-1445 PT Individual Time Calculation (min): 60 min, 45 min  Short Term Goals: Week 1:  PT Short Term Goal 1 (Week 1): =LTG due to ELOS  Skilled Therapeutic Interventions/Progress Updates:    AM Session: Pt received in recliner. States he still has knee pain. Nursing applied topical gel to both knees, pt reports a cooling/numbing sensation. Pt tolerated gait  2 x 166ft with CGA and RW with complaint of knee pain. Pt performed STS 2 x 5 from mat table, complains of difficulty straightening knee with VC and TC. Pt responded well to soft tissue mobilization/trigger point release at the L medial quad and superior patellar mobilization in long sitting. Pt reported that his knee pain was improved after manual therapy. Pt left positioned in recliner with wedge under knees to give a low load, long duration stretch. Pt left with all needs in reach.   PM Session: Pt received in recliner and states he feels the gel is working because his knee feels numb. Pt ambulated 200 ft with RW and CGA. Pt was able to stand from low sofa with minA and RW and from recliner with CGA and RW. Pt was supervision with bed mobility. Pt states he normally sleeps on the left side of the bed, but had less difficulty when getting in and out of bed from the right side. Discussed home bedroom set up, pt states that he will have difficulty fitting his walker through to get to the left side of the bed, suggested pt switch sides with his wife but pt states he doesn't think she will be willing. Pt will arrange dresser drawers at home to put daily use items in top drawers. Pt ambulated 330 ft back to his room with CGA and RW. Pt returned to recliner with ice pack on knee and all needs within reach.   Therapy Documentation Precautions:  Precautions Precautions:  Fall,Cervical Precaution Booklet Issued:  (discussed precautions) Precaution Comments: Blind Required Braces or Orthoses: Cervical Brace Cervical Brace: Hard collar,Other (comment) (don in sitting, ok to doff in bed) Restrictions Weight Bearing Restrictions: No General:      Therapy/Group: Individual Therapy  Dyane Dustman, SPT 11/26/2020, 10:47 AM

## 2020-11-26 NOTE — IPOC Note (Signed)
Overall Plan of Care [facility]) Patient Details Name: Wayne Mckinney MRN: [id number] DOB: Jan 31, 1966  Admitting Diagnosis: Cervical myelopathy [facility])  Hospital Problems: Principal Problem:   Cervical myelopathy (HCC)     Functional Problem List: Nursing Medication Management,Endurance,Skin Integrity,Sensory,Safety,Motor,Pain  PT Balance,Endurance,Motor,Pain,Safety,Sensory  OT Balance,Safety,Vision,Endurance,Motor,Pain  SLP    TR         Basic ADL's: OT Grooming,Bathing,Dressing,Toileting     Advanced  ADL's: OT Laundry     Transfers: PT Bed Mobility,Bed to Chair,Car,Furniture,Floor  OT Toilet,Tub/Shower     Locomotion: PT Ambulation,Wheelchair Mobility,Stairs     Additional Impairments: OT None  SLP        TR      Anticipated Outcomes Item Anticipated Outcome  Self Feeding No goal  Swallowing      Basic self-care  Supervision  Toileting  Supervision   Bathroom Transfers Supervision  Bowel/Bladder  Patient to remain continent of bowel/bladder with supervision  Transfers  Supervision  Locomotion  Supervision with LRAD  Communication     Cognition     Pain  no pain  Safety/Judgment  able to call for help and express needs   Therapy Plan: PT Intensity: Minimum of 1-2 x/day ,45 to 90 minutes PT Frequency: 5 out of 7 days PT Duration Estimated Length of Stay: 7-10 days OT Intensity: Minimum of 1-2 x/day, 45 to 90 minutes OT Frequency: 5 out of 7 days OT Duration/Estimated Length of Stay: 7-10 days     Due to the current state of emergency, patients may not be receiving their 3-hours of Medicare-mandated therapy.   Team Interventions: Nursing Interventions Patient/Family Education,Pain Management,Medication Management,Discharge Planning,Skin Care/Wound Management,Disease Management/Prevention  PT interventions Ambulation/gait training,Balance/vestibular training,Community reintegration,Discharge planning,DME/adaptive equipment instruction,Functional  mobility training,Neuromuscular re-education,Pain management,Patient/family education,Psychosocial support,Stair training,Therapeutic Activities,Therapeutic Exercise,UE/LE Strength taining/ROM,UE/LE Coordination activities  OT Interventions Balance/vestibular training,Discharge planning,Pain management,Self Care/advanced ADL retraining,Therapeutic Activities,UE/LE Coordination activities,Therapeutic Exercise,Patient/family education,Functional mobility training,Disease mangement/prevention,Community reintegration,DME/adaptive equipment instruction,Neuromuscular re-education,Psychosocial support,UE/LE Strength taining/ROM  SLP Interventions    TR Interventions    SW/CM Interventions     Barriers to Discharge MD  Medical stability, Home enviroment access/loayout, Wound care, Lack of/limited family support, Weight bearing restrictions and blindness from macular degeneration  Nursing      PT Decreased caregiver support,Home environment access/layout    OT Lack of/limited family support Pt planning to hire aides to provide supervision support with wife post hospitalization  SLP      SW       Team Discharge Planning: Destination: PT-Home ,OT-   , SLP-  Projected Follow-up: PT-Home health PT, OT-  Home health OT,24 hour supervision/assistance, SLP-  Projected Equipment Needs: PT-Rolling walker with 5" wheels, OT- To be determined, SLP-  Equipment Details: PT-pt reports he owns RW and w/c, OT-  Patient/family involved in discharge planning: PT- Patient,  OT-Patient, SLP-   MD ELOS: 7-10 days Medical Rehab Prognosis:  Excellent Assessment: Pt is a 55 yr old male with hx of multiple falls with cervical myelopathy as a result s/p ACDF in [place] J collar-  Pt also had witnessed seizure and lBM 4+ days ago- gave sorbitol to get him to go.  Also having B/L knee pain- trying topicals, but if doesn't help with PO pain meds, will need steroid injections of B/L knees.   Goals supervision due to  blindness, by d/c in 7-10 days     See Team Conference Notes for weekly updates to the plan of care

## 2020-11-26 NOTE — Progress Notes (Signed)
[facility] PHYSICAL MEDICINE & REHABILITATION PROGRESS NOTE   Subjective/Complaints:  Pt reports voltaren gel has helped knee pain somewhat- will see, when walks, if helps "enough".   Says it's been 7 days since surgery.  Scared of knee injections.   ROS:  Pt denies SOB, abd pain, CP, N/V/C/D, and vision changes  Objective:   No results found. Recent Labs    11/24/20 0505 11/25/20 0548  WBC 3.8* 3.6*  HGB 13.4 13.6  HCT 40.8 40.8  PLT 225 254   Recent Labs    11/24/20 0505 11/25/20 0548  NA 138 137  K 3.7 3.7  CL 100 100  CO2 30 28  GLUCOSE 106* 102*  BUN 15 14  CREATININE 1.12 1.03  CALCIUM 9.4 9.5    Intake/Output Summary (Last 24 hours) at 11/26/2020 0956 Last data filed at 11/26/2020 0720 Gross per 24 hour  Intake 720 ml  Output 2100 ml  Net -1380 ml        Physical Exam: Vital Signs Blood pressure 129/77, pulse 63, temperature 97.8 F (36.6 C), temperature source Oral, resp. rate 16, height 6\' 2"  (1.88 m), SpO2 100 %. Gen: awake, blind, appropriate, PT and RN in room, NAD HEENT: oral mucosa pink and moist, NCAT. Blind from macular degeneration. 1 tooth sticks out slightly in front Cardio: RRR Chest: CTA B/L- no W/R/R- good air movement Abd: Soft, NT, ND, (+)BS   Ext: no edema Psych: appropriate, smiling, anxious about injections Skin: intact Neuro: Musculoskeletal:        General: Swelling present.     Comments: Left knee with mild ot moderate effusion and skin changes due to resolving ecchymosis? Tender on medial joint line and tibial plateau. Right knee with minimal edema but also TTP on tibial plateau and medial joint line   Neurological: Ox3   5/5 strength in upper extremities. 4/5 strength in lower extremities. Sensation is intact.    Assessment/Plan: 1. Functional deficits which require 3+ hours per day of interdisciplinary therapy in a comprehensive inpatient rehab setting.  Physiatrist is providing close team supervision and 24  hour management of active medical problems listed below.  Physiatrist and rehab team continue to assess barriers to discharge/monitor patient progress toward functional and medical goals  Care Tool:  Bathing    Body parts bathed by patient: Right arm,Left arm,Chest,Abdomen,Front perineal area,Buttocks,Right upper leg,Left upper leg,Face   Body parts bathed by helper: Right lower leg,Left lower leg     Bathing assist Assist Level: Minimal Assistance - Patient > 75%     Upper Body Dressing/Undressing Upper body dressing   What is the patient wearing?: Pull over shirt    Upper body assist Assist Level: Supervision/Verbal cueing    Lower Body Dressing/Undressing Lower body dressing      What is the patient wearing?: Underwear/pull up,Pants     Lower body assist Assist for lower body dressing: Contact Guard/Touching assist     Toileting Toileting Toileting Activity did not occur (Clothing management and hygiene only): N/A (no void or bm)  Toileting assist Assist for toileting: Independent with assistive device Assistive Device Comment: urinal   Transfers Chair/bed transfer  Transfers assist     Chair/bed transfer assist level: Contact Guard/Touching assist     Locomotion Ambulation   Ambulation assist      Assist level: Minimal Assistance - Patient > 75% Assistive device: Walker-rolling Max distance: 150'   Walk 10 feet activity   Assist     Assist level: Minimal Assistance -  Patient > 75% Assistive device: Walker-rolling   Walk 50 feet activity   Assist    Assist level: Minimal Assistance - Patient > 75% Assistive device: Walker-rolling    Walk 150 feet activity   Assist    Assist level: Minimal Assistance - Patient > 75% Assistive device: Walker-rolling    Walk 10 feet on uneven surface  activity   Assist Walk 10 feet on uneven surfaces activity did not occur: Safety/medical concerns         Wheelchair     Assist Will  patient use wheelchair at discharge?: No             Wheelchair 50 feet with 2 turns activity    Assist            Wheelchair 150 feet activity     Assist          Blood pressure 129/77, pulse 63, temperature 97.8 F (36.6 C), temperature source Oral, resp. rate 16, height 6\' 2"  (1.88 m), SpO2 100 %.  Medical Problem List and Plan: 1.  Impaired mobility and ADLs secondary to cervical myelopathy s/p ACDF             -patient may shower but spinal incision must be covered.              -ELOS/Goals: modI in 5-7 days  Initial CIR evaluations today. 2.  Antithrombotics: -DVT/anticoagulation:  Pharmaceutical: Heparin             -antiplatelet therapy: N/A 3. Pain Management: Hydrocodone prn seems to be effective. Will continue robaxin prn for back muscle spasms. Using approximately twice per day. Discussed decreasing frequency to q6H prn and patient is agreeable.  1/18- added voltaren gel for knee pain QID- if doesn't help enough, will need steroid injections for B/L knee pain 4. Mood: LCSW to follow for evaluation and support.              -antipsychotic agents: N/A 5. Neuropsych: This patient is capable of making decisions on his own behalf. 6. Skin/Wound Care: Routine pressure relief measures. Continue surgical dressing for now.   1/18- d/c honeycomb dressing and place dry dressing daily.  7. Fluids/Electrolytes/Nutrition: Monitor I/O. Electrolytes stable 1/16  8. HTN: Monitor BP tid. Monitor for orthostatic changes. Currently under excellent control- continue Norvasc and Microzide. 9. Seizure prophylaxis: Continue keppra 500 mg bid-->should help with neuropathy also.   1/17- also c/o lisp- explained can take up to 2 months or so to go away sometimes.  10. Constipation: Last received senna-docusate 1/12.  1/17- LBM 2 days ago- denies constipation  1/18- LBM at least 3-4 days ago- will order sorbitol for bowels- since nothing documented.  11. Hypoalbuminemia:  encourage high protein foods  12. B/L L>R knee pain  1/17- will order voltaren gel QID for now- doesn't want surgery- since on SQ Heparin, wary to start NSAID for bleeding risk.   1/18- wait today on knee injections- see how topical voltaren helping.   LOS: 3 days A FACE TO FACE EVALUATION WAS PERFORMED  Wayne Mckinney 11/26/2020, 9:56 AM

## 2020-11-26 NOTE — Progress Notes (Signed)
Occupational Therapy Session Note  Patient Details  Name: Wayne Mckinney MRN: [id number] Date of Birth: [birth year]  Today's Date: 11/26/2020 OT Individual Time: 1275-1700 OT Individual Time Calculation (min): 40 min    Short Term Goals: Week 1:  OT Short Term Goal 1 (Week 1): LTGs=STGs due to ELOS  Skilled Therapeutic Interventions/Progress Updates:    Pt resting in recliner with CSW present. OT intervention with focus on functional amb with RW in simulated confined spaces to replicate bathroom setup at home. Pt states that often he has to turn his walker at an angle to enter bathroom. Pt demonstrate proficiency in managing RW to simulate home setup. Pt amb with RW backwards since he states he often has to in his bathroom to access bathtub. Pt concerned that he probably will not be able to get HHA after discharge. Pt will require supervision at discharge and wife works. Pt supervision for all ambulation with RW with min verbal cues for navigating in unfamiliar envirionment. Pt remained in recliner with all nees within reach.  Therapy Documentation Precautions:  Precautions Precautions: Fall,Cervical Precaution Booklet Issued:  (discussed precautions) Precaution Comments: Blind Required Braces or Orthoses: Cervical Brace Cervical Brace: Hard collar,Other (comment) (don in sitting, ok to doff in bed) Restrictions Weight Bearing Restrictions: No   Pain:  Pt c/o Lt medial knee pain (unrated); ice pack provided   Therapy/Group: Individual Therapy  Rich Brave 11/26/2020, 2:56 PM

## 2020-11-26 NOTE — Progress Notes (Signed)
Izora Ribas, MD  Physician  Physical Medicine and Rehabilitation  PMR Pre-admission     Signed  Date of Service:  11/22/2020 10:53 AM      Related encounter: Admission (Discharged) from 11/19/2020 in [place]       Signed         Show:Clear all [x] Manual[x] Template[] Copied  Added by: [x] Raulkar, Clide Deutscher, MD[x] Michel Santee, PT   [] Hover for details  PMR Admission Coordinator Pre-Admission Assessment  Patient: Wayne Mckinney is an 55 y.o., male MRN: [id number] DOB: 1966/07/20 Height: 6\' 2"  (188 cm) Weight: 89.8 kg  Insurance Information HMO:     PPO: yes     PCP:      IPA:      80/20:      OTHER:  PRIMARY: BCBS Commercial      Policy#: [id number]      Subscriber: pt CM Name: Celso Amy      Phone#: [phone]     Fax#: [phone] Pre-Cert#: [id number] [place] for CIR given from Larene [place] at [facility] via fax, with updates to due her at fax listed above on 12/05/2020      Employer: [employer]:  Phone #: [phone]     Name:  Eff. Date: 02/08/20     Deduct: $5000 ($0 met)      Out of Pocket Max: [phone] (met $489.06)      Life Max: n/a CIR: 50%      SNF: 50% (limit 60 days) Outpatient:      Co-Pay: $100/visit (limited 30 PT/OT, 30 SLP) Home Health: 50%      Co-Ins: 50% DME: 50%     Co-Ins: 50% Providers: preferred network SECONDARY: UHC Medicare      Policy#: [id number]     Phone#: [phone]  Financial Counselor:       Phone#:   The "Data Collection Information Summary" for patients in Inpatient Rehabilitation Facilities with attached "Privacy Act [place] Records" was provided and verbally reviewed with: Patient and Family  Emergency Contact Information         Contact Information    Name Relation Home Work Mobile   [place] Spouse   [phone]      Current Medical History  Patient Admitting Diagnosis: cervical myelopathy  History of Present Illness:  Pt is a 55 y/o male with PMH of HTN, and progressive weakness 2/2 cervical cord compression and myelopathy.  Pt was admitted on 11/19/20 for planned ACDF C5-7. Post op course pain management. On Post op day 2 pt with seizure like activity during therapy session, decreased level of arousal, unresponsive with loss of B/B, lasting approximately 1 minute.  Workup negative and pt was started on Keppra.  No further episodes.  Therapy continues to recommend CIR for comprehensive rehab prior to returning home.   Patient's medical record from [facility] has been reviewed by the rehabilitation admission coordinator and physician.  Past Medical History      Past Medical History:  Diagnosis Date  . Hypertension   . Legally blind     Family History   family history is not on file.  Prior Rehab/Hospitalizations Has the patient had prior rehab or hospitalizations prior to admission? No  Has the patient had major surgery during 100 days prior to admission? Yes             Current Medications  Current Facility-Administered Medications:  .  0.9 %  sodium chloride infusion, , Intravenous,  Continuous, Dawley, Troy C, DO, Last Rate: 75 mL/hr at 11/19/20 Valerie Roys, [place] at 11/19/20 1848 .  acetaminophen (TYLENOL) tablet 650 mg, 650 mg, Oral, Q4H PRN **OR** acetaminophen (TYLENOL) suppository 650 mg, 650 mg, Rectal, Q4H PRN, Dawley, Troy C, DO .  alum & mag hydroxide-simeth (MAALOX/MYLANTA) 200-200-20 MG/5ML suspension 30 mL, 30 mL, Oral, Q6H PRN, Dawley, Troy C, DO .  amLODipine (NORVASC) tablet 10 mg, 10 mg, Oral, Daily, Dawley, Troy C, DO, 10 mg at 11/22/20 8527 .  heparin injection 5,000 Units, 5,000 Units, Subcutaneous, Q8H, Dawley, Troy C, DO .  hydrochlorothiazide (MICROZIDE) capsule 12.5 mg, 12.5 mg, Oral, Daily, Dawley, Troy C, DO, 12.5 mg at 11/22/20 0929 .  HYDROcodone-acetaminophen (NORCO/VICODIN) 5-325 MG per tablet 1 tablet, 1 tablet, Oral, Q4H PRN, Dawley, Troy C, DO, 1 tablet at 11/22/20  0929 .  HYDROmorphone (DILAUDID) injection 0.5 mg, 0.5 mg, Intravenous, Q3H PRN, Dawley, Troy C, DO .  levETIRAcetam (KEPPRA) tablet 500 mg, 500 mg, Oral, BID, Dawley, Troy C, DO, 500 mg at 11/22/20 0929 .  menthol-cetylpyridinium (CEPACOL) lozenge 3 mg, 1 lozenge, Oral, PRN **OR** phenol (CHLORASEPTIC) mouth spray 1 spray, 1 spray, Mouth/Throat, PRN, Dawley, Troy C, DO .  methocarbamol (ROBAXIN) tablet 500 mg, 500 mg, Oral, Q6H PRN, 500 mg at 11/22/20 0929 **OR** methocarbamol (ROBAXIN) 500 mg in dextrose 5 % 50 mL IVPB, 500 mg, Intravenous, Q6H PRN, Dawley, Troy C, DO .  ondansetron (ZOFRAN) tablet 4 mg, 4 mg, Oral, Q6H PRN **OR** ondansetron (ZOFRAN) injection 4 mg, 4 mg, Intravenous, Q6H PRN, Dawley, Troy C, DO .  senna-docusate (Senokot-S) tablet 1 tablet, 1 tablet, Oral, QHS PRN, Dawley, Troy C, DO, 1 tablet at 11/20/20 2054  Patients Current Diet:     Diet Order                  Diet regular Room service appropriate? Yes; Fluid consistency: Thin  Diet effective now                  Precautions / Restrictions Precautions Precautions: Fall,Cervical Precaution Comments: Precautions reviewed with patient. Good adherence during ADLs. Cervical Brace: Hard collar,At all times Restrictions Weight Bearing Restrictions: No   Has the patient had 2 or more falls or a fall with injury in the past year? 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 Independent  Indoor Mobility: Did the patient need assistance with walking from room to room (with or without device)? Independent  Stairs: Did the patient need assistance with internal or external stairs (with or without device)?  Independent  Functional Cognition: Did the patient need help planning regular tasks such as shopping or remembering to take medications? Independent  Home Assistive Devices / Equipment Home Assistive Devices/Equipment: Environmental consultant (specify type),Wheelchair,Eyeglasses Home Equipment: Walker - standard (seeing eye cane)  Prior Device Use: Indicate devices/aids used by the patient prior to current illness, exacerbation or injury? guide cane, also RW more recently  Current Functional Level Cognition  Overall Cognitive Status: Within Functional Limits for tasks assessed Orientation Level: Oriented X4    Extremity Assessment (includes Sensation/Coordination)  Upper Extremity Assessment: Overall WFL for tasks assessed  Lower Extremity Assessment: RLE deficits/detail,LLE deficits/detail RLE  Deficits / Details: ataxic movement decreased proprioception LLE Deficits / Details: pain at the lateral aspect of the knee s/p fall 12/26 and ataxic (less than R)    ADLs  Overall ADL's : Needs assistance/impaired Eating/Feeding: Set up Grooming: Minimal assistance,Standing Upper Body Bathing: Minimal assistance,Bed level Lower Body Bathing: Maximal assistance Upper Body Dressing Details (indicate cue type and reason): total (A) with brace General ADL Comments: pt completed sit<>Stand from bed surface for the first time with max v/c    Mobility  Overal bed mobility: Needs Assistance Bed Mobility: Rolling,Sidelying to Sit Rolling: Min guard Sidelying to sit: Min guard Supine to sit: Mod assist Sit to supine: Max assist General bed mobility comments: HOB flat to simulate home, use of rail. Good demo of log roll technique, increased time but no physical assist needed.    Transfers  Overall transfer level: Needs assistance Equipment used: Rolling walker (2 wheeled) Transfers: Sit to/from Stand Sit to Stand: Min assist Stand pivot transfers: Min assist General transfer comment: Assist to  power to standing with cues for hand placement/technique. Stood from [employer], from chair x1.    Ambulation / Gait / Stairs / Wheelchair Mobility  Ambulation/Gait Ambulation/Gait assistance: Herbalist (Feet): 175 Feet Assistive device: Rolling walker (2 wheeled) Gait Pattern/deviations: Step-to pattern,Step-through pattern,Decreased stride length,Ataxic,Narrow base of support General Gait Details: Slow, mildly unsteady step to gait with increased knee flexion throughout; able to progress to step through gait with cues. Assist for directional cues and balance. No scissoring today but mildly ataxic with narrow BoS. Gait velocity: Decreased Gait velocity interpretation: <1.31 ft/sec, indicative of household ambulator    Posture / Balance Balance Overall balance assessment: Needs assistance Sitting-balance support: No upper extremity supported,Feet supported Sitting balance-Leahy Scale: Fair Standing balance support: During functional activity,Bilateral upper extremity supported Standing balance-Leahy Scale: Poor Standing balance comment: Reliant on BUE on RW to maintain standing balance during functional tasks.    Special needs/care consideration Skin surgical incision to anterior neck, Special service needs blind and Designated visitor spouse, [place] (from acute therapy documentation) Living Arrangements: Spouse/significant other Available Help at Discharge: Family,Available PRN/intermittently Type of Home: Apartment Home Layout: Two level Alternate Level Stairs-Rails: Left Alternate Level Stairs-Number of Steps: 14 Home Access: Stairs to enter Entrance Stairs-Rails: Left Entrance Stairs-Number of Steps: 14 Bathroom Shower/Tub: Chiropodist: [place]: No Additional Comments: pt has been sponge bathing with tub since 12/26. pt has an Programmer, systems walker/ friend let him borrow a  wheelchair. pt reports he wants a walker for 500 pound person so its more sturdy  Discharge Living Setting Plans for Discharge Living Setting: Patient's home,Lives with (comment) (spouse) Type of Home at Discharge: Apartment Discharge Home Layout: One level Discharge Home Access: Stairs to enter (2nd floor apt, trying to get 1st floor) Entrance Stairs-Rails: Left Entrance Stairs-Number of Steps: 14 (5+9 with landing) Discharge Bathroom Shower/Tub: Tub/shower unit Discharge Bathroom Toilet: Standard Discharge Bathroom Accessibility: Yes How Accessible: Accessible via walker Does the patient have any problems obtaining your medications?: No  Social/Family/Support Systems Patient Roles: Spouse Anticipated Caregiver: wife, Dervin Vore Anticipated Caregiver's Contact Information: [phone] Ability/Limitations of Caregiver: she is also legally blind, works FT but taking Field seismologist Availability: 24/7 (initially) Discharge Plan Discussed with Primary Caregiver: Yes Is Caregiver In Agreement with Plan?: Yes Does Caregiver/Family have Issues with Lodging/Transportation while Pt is in Rehab?: Yes (relies on friends/transport to get to/from hospital)  Goals Patient/Family  Goal for Rehab: PT/OT supervision to CGA in familiar environment, min assist in unfamiliar environment Expected length of stay: 7-10 days Additional Information: Pt is blind; does not see light/shapes/shadows Pt/Family Agrees to Admission and willing to participate: Yes Program Orientation Provided & Reviewed with Pt/Caregiver Including Roles  & Responsibilities: Yes  Barriers to Discharge: Insurance for SNF coverage  Decrease burden of Care through IP rehab admission: n/a  Possible need for SNF placement upon discharge: Not anticipated.   Patient Condition: I have reviewed medical records from [facility], spoken with CSW, and patient and spouse. I met with patient at the bedside for inpatient  rehabilitation assessment.  Patient will benefit from ongoing PT and OT, can actively participate in 3 hours of therapy a day 5 days of the week, and can make measurable gains during the admission.  Patient will also benefit from the coordinated team approach during an Inpatient Acute Rehabilitation admission.  The patient will receive intensive therapy as well as Rehabilitation physician, nursing, social worker, and care management interventions.  Due to safety, skin/wound care, disease management, medication administration, pain management and patient education the patient requires 24 hour a day rehabilitation nursing.  The patient is currently min assist with mobility and basic ADLs.  Discharge setting and therapy post discharge at home with home health is anticipated.  Patient has agreed to participate in the Acute Inpatient Rehabilitation Program and will admit Saturday (1/15).  Preadmission Screen Completed By:  Michel Santee, PT, DPT 11/22/2020 11:14 AM ______________________________________________________________________   Discussed status with Dr. Ranell Patrick on 11/22/20 at 11:28 AM  and received approval for admission Saturday 1/15.   Admission Coordinator:  Michel Santee, PT, DPT time 11:28 AM Sudie Grumbling 11/22/20    Assessment/Plan: Diagnosis: s/p ACDF for cervical myelopathy 1. Does the need for close, 24 hr/day Medical supervision in concert with the patient's rehab needs make it unreasonable for this patient to be served in a less intensive setting? Yes 2. Co-Morbidities requiring supervision/potential complications: postoperative pain, HTN, macular degeneration with visual loss, h/o multiple falls, LLE>RLE weakness 3. Due to bladder management, bowel management, safety, skin/wound care, disease management, medication administration, pain management and patient education, does the patient require 24 hr/day rehab nursing? Yes 4. Does the patient require coordinated care of a physician,  rehab nurse, PT, OT to address physical and functional deficits in the context of the above medical diagnosis(es)? Yes Addressing deficits in the following areas: balance, endurance, locomotion, strength, transferring, bowel/bladder control, bathing, dressing, feeding, grooming, toileting and psychosocial support 5. Can the patient actively participate in an intensive therapy program of at least 3 hrs of therapy 5 days a week? Yes 6. The potential for patient to make measurable gains while on inpatient rehab is excellent 7. Anticipated functional outcomes upon discharge from inpatient rehab: modified independent PT, modified independent OT, independent SLP 8. Estimated rehab length of stay to reach the above functional goals is: 5-7 days 9. Anticipated discharge destination: Home 10. Overall Rehab/Functional Prognosis: excellent   MD Signature: Leeroy Cha, MD          Revision History                        Note Details  Author Izora Ribas, MD File Time 11/23/2020 11:21 AM  Author Type Physician Status Signed  Last Editor Izora Ribas, MD Service Physical Medicine and [place] # [id number] Admit Date 11/23/2020

## 2020-11-27 ENCOUNTER — Inpatient Hospital Stay (HOSPITAL_COMMUNITY): Payer: BC Managed Care – PPO | Admitting: Physical Therapy

## 2020-11-27 ENCOUNTER — Inpatient Hospital Stay (HOSPITAL_COMMUNITY): Payer: BC Managed Care – PPO | Admitting: Occupational Therapy

## 2020-11-27 MED ORDER — MAGNESIUM CITRATE PO SOLN
1.0000 | Freq: Once | ORAL | Status: DC
  Filled 2020-11-27: qty 296

## 2020-11-27 NOTE — Progress Notes (Signed)
Physical Therapy Session Note  Patient Details  Name: Isiaah Cuervo MRN: [id number] Date of Birth: [birth year]  Today's Date: 11/27/2020 PT Individual Time: 1300-1400 PT Individual Time Calculation (min): 60 min   Short Term Goals: Week 1:  PT Short Term Goal 1 (Week 1): =LTG due to ELOS  Skilled Therapeutic Interventions/Progress Updates:    Pt received seated in recliner, stated that his L knee continues to be painful. Applied topical gel after discussing with nursing for pain relief. Upon palpation, pt reports point tenderness at the medial joint line, as well as tenderness in surrounding soft tissue which suggests possible meniscus involvement. Pt tenderness has increased since palpation during previous sessions. Pt ambulated x100 ft, 50 ft with CGA and RW. Pt tolerated LAQ and step taps, RW and CGA, stating that straightening his L knee is very painful. Pt was able to ascend and descend 16 stairs with CGA and 2 handrails. Pt states that the steps to his second floor apartment are narrow and he is not sure he will be able to put both feet on the step to ascend side ways. Also reports that there is no rail at landing, but landing is enclosed. Plan to practice stairs in a manner more similar to home environment.  Pt returned to recliner with ice pack on knee for pain relief.   Therapy Documentation Precautions:  Precautions Precautions: Fall,Cervical Precaution Booklet Issued:  (discussed precautions) Precaution Comments: Blind Required Braces or Orthoses: Cervical Brace Cervical Brace: Hard collar,Other (comment) (don in sitting, ok to doff in bed) Restrictions Weight Bearing Restrictions: No    Therapy/Group: Individual Therapy  Dyane Dustman, SPT 11/27/2020, 5:19 PM

## 2020-11-27 NOTE — Progress Notes (Signed)
Patient states he was assisted to bathroom by night shift nurse and had a BM yesterday, feels about half way emptied out. Agreed to sorbitol, refuses mag citrate at this time.

## 2020-11-27 NOTE — Progress Notes (Signed)
Wayne Mckinney PHYSICAL MEDICINE & REHABILITATION PROGRESS NOTE   Subjective/Complaints:  Knee pain doing better- voltaren gel helping a lot.  Doesn't want to get shoulder injections right now.  Big toes are now numb. Which is new.      ROS:  Pt denies SOB, abd pain, CP, N/V/C/D, and vision changes   Objective:   No results found. Recent Labs    11/25/20 0548  WBC 3.6*  HGB 13.6  HCT 40.8  PLT 254   Recent Labs    11/25/20 0548  NA 137  K 3.7  CL 100  CO2 28  GLUCOSE 102*  BUN 14  CREATININE 1.03  CALCIUM 9.5    Intake/Output Summary (Last 24 hours) at 11/27/2020 1039 Last data filed at 11/27/2020 0721 Gross per 24 hour  Intake 960 ml  Output 850 ml  Net 110 ml        Physical Exam: Vital Signs Blood pressure 126/83, pulse (!) 59, temperature 98.4 F (36.9 C), resp. rate 16, height 6\' 2"  (1.88 m), SpO2 100 %. Gen: awake, alert, blind-chronic, nustep, NAD HEENT: oral mucosa pink and moist, NCAT. Blind from macular degeneration. 1 tooth sticks out slightly in front- no change Cardio: borderline bradycardic- regular rhythm Chest: CTA B/L- no W/R/R- good air movement Abd: Soft, NT, ND, (+)BS    Ext: no edema Psych: appropriate Skin: intact Neuro: Musculoskeletal:        General: Swelling present.     Comments: Left knee with mild to moderate effusion and skin changes due to resolving ecchymosis? Tender on medial joint line and tibial plateau. Right knee with minimal edema but also TTP on tibial plateau and medial joint line  - a little bette/less TTP than prior Neurological: Ox3   5/5 strength in upper extremities. 4/5 strength in lower extremities. Sensation is intact.    Assessment/Plan: 1. Functional deficits which require 3+ hours per day of interdisciplinary therapy in a comprehensive inpatient rehab setting.  Physiatrist is providing close team supervision and 24 hour management of active medical problems listed below.  Physiatrist and rehab  team continue to assess barriers to discharge/monitor patient progress toward functional and medical goals  Care Tool:  Bathing    Body parts bathed by patient: Right arm,Left arm,Chest,Abdomen,Front perineal area,Buttocks,Right upper leg,Left upper leg,Face   Body parts bathed by helper: Right lower leg,Left lower leg     Bathing assist Assist Level: Minimal Assistance - Patient > 75%     Upper Body Dressing/Undressing Upper body dressing   What is the patient wearing?: Pull over shirt    Upper body assist Assist Level: Set up assist    Lower Body Dressing/Undressing Lower body dressing      What is the patient wearing?: Underwear/pull up,Pants     Lower body assist Assist for lower body dressing: Contact Guard/Touching assist     Toileting Toileting Toileting Activity did not occur (Clothing management and hygiene only): N/A (no void or bm)  Toileting assist Assist for toileting: Independent with assistive device Assistive Device Comment: urinal   Transfers Chair/bed transfer  Transfers assist     Chair/bed transfer assist level: Minimal Assistance - Patient > 75%     Locomotion Ambulation   Ambulation assist      Assist level: Contact Guard/Touching assist Assistive device: Walker-rolling Max distance: 150   Walk 10 feet activity   Assist     Assist level: Contact Guard/Touching assist Assistive device: Walker-rolling   Walk 50 feet activity  Assist    Assist level: Contact Guard/Touching assist Assistive device: Walker-rolling    Walk 150 feet activity   Assist    Assist level: Contact Guard/Touching assist Assistive device: Walker-rolling    Walk 10 feet on uneven surface  activity   Assist Walk 10 feet on uneven surfaces activity did not occur: Safety/medical concerns         Wheelchair     Assist Will patient use wheelchair at discharge?: No             Wheelchair 50 feet with 2 turns  activity    Assist            Wheelchair 150 feet activity     Assist          Blood pressure 126/83, pulse (!) 59, temperature 98.4 F (36.9 C), resp. rate 16, height 6\' 2"  (1.88 m), SpO2 100 %.  Medical Problem List and Plan: 1.  Impaired mobility and ADLs secondary to cervical myelopathy s/p ACDF             -patient may shower but spinal incision must be covered.              -ELOS/Goals: modI in 5-7 days  Initial CIR evaluations today. 2.  Antithrombotics: -DVT/anticoagulation:  Pharmaceutical: Heparin             -antiplatelet therapy: N/A 3. Pain Management: Hydrocodone prn seems to be effective. Will continue robaxin prn for back muscle spasms. Using approximately twice per day. Discussed decreasing frequency to q6H prn and patient is agreeable.  1/18- added voltaren gel for knee pain QID- if doesn't help enough, will need steroid injections for B/L knee pain  1/19- pt said voltaren is working enough- doesn't want injections right now 4. Mood: LCSW to follow for evaluation and support.              -antipsychotic agents: N/A 5. Neuropsych: This patient is capable of making decisions on his own behalf. 6. Skin/Wound Care: Routine pressure relief measures. Continue surgical dressing for now.   1/18- d/c honeycomb dressing and place dry dressing daily.  7. Fluids/Electrolytes/Nutrition: Monitor I/O. Electrolytes stable 1/16  8. HTN: Monitor BP tid. Monitor for orthostatic changes. Currently under excellent control- continue Norvasc and Microzide. 9. Seizure prophylaxis: Continue keppra 500 mg bid-->should help with neuropathy also.   1/17- also c/o lisp- explained can take up to 2 months or so to go away sometimes.  10. Constipation: Last received senna-docusate 1/12.  1/17- LBM 2 days ago- denies constipation  1/18- LBM at least 3-4 days ago- will order sorbitol for bowels- since nothing documented.   1/19- didn't have any documented BM- will order Mg citrate  after therapy.  11. Hypoalbuminemia: encourage high protein foods  12. B/L L>R knee pain  1/17- will order voltaren gel QID for now- doesn't want surgery- since on SQ Heparin, wary to start NSAID for bleeding risk.   1/18- wait today on knee injections- see how topical voltaren helping.  1/19- thinks helping enough- doesn't want injections right now    LOS: 4 days A FACE TO FACE EVALUATION WAS PERFORMED  Wayne Mckinney 11/27/2020, 10:39 AM

## 2020-11-27 NOTE — Progress Notes (Signed)
Physical Therapy Session Note  Patient Details  Name: Wayne Mckinney MRN: [id number] Date of Birth: [birth year]  Today's Date: 11/27/2020 PT Individual Time: 0805-0900 PT Individual Time Calculation (min): 55 min   Short Term Goals: Week 1:  PT Short Term Goal 1 (Week 1): =LTG due to ELOS  Skilled Therapeutic Interventions/Progress Updates: Pt presented in recliner agreeable to therapy. Pt c/o pain L knee and had not received pain meds as of yet. Pt spent time explaining to PTA concerns regarding activities and increased pain in L knee with activity with PTA indicating will not be aggressive during am session and will notify nsg regarding pain meds. Pt then agreeable to ambulate to day room. Performed STS with CGA from recliner and ambulated to day room with CGA and PTA providing verbal cues for direction. Pt participated in NuStep L8 x 10 min with pt maintaining avg 50 SPM for general conditioning. Pt then performed x 5 min on L5 x 5 min with BLE for strengthening. During NuStep pt with x 1 break for receive am meds and MD assessment. Pt indicated increased pain at L knee medial aspect particularly with full extension during NuStep therefore maintained partial knee flexion during NuStep however PTA expressed concern for overly guarding knee and importance of knee extension to prevent contracture as well as to achieve/maintain improved gait mechanics. Pt verbalizing understanding. Pt ambulated back to room in same manner as prior and returned to recliner with CGA overall and verbal cues as needed. Pt left in recliner at end of session with call bell within reach and needs met.      Therapy Documentation Precautions:  Precautions Precautions: Fall,Cervical Precaution Booklet Issued:  (discussed precautions) Precaution Comments: Blind Required Braces or Orthoses: Cervical Brace Cervical Brace: Hard collar,Other (comment) (don in sitting, ok to doff in bed) Restrictions Weight Bearing  Restrictions: No General:   Vital Signs: Therapy Vitals Temp: 98.3 F (36.8 C) Temp Source: Oral Pulse Rate: 69 Resp: 18 BP: 119/73 Patient Position (if appropriate): Sitting Oxygen Therapy SpO2: 97 % O2 Device: Room Air Pain: Pain Assessment Pain Scale: 0-10 Pain Score: 4  Pain Type: Acute pain Pain Location: Back Pain Orientation: Upper;Mid Pain Descriptors / Indicators: Aching Pain Frequency: Intermittent Pain Onset: With Activity Pain Intervention(s): Medication (See eMAR) Mobility:   Locomotion :    Trunk/Postural Assessment :    Balance:   Exercises:   Other Treatments:      Therapy/Group: Individual Therapy  Nikala Walsworth 11/27/2020, 4:17 PM

## 2020-11-27 NOTE — Progress Notes (Signed)
Inpatient Rehabilitation Care Coordinator Assessment and Plan Patient Details  Name: Wayne Mckinney MRN: [id number] Date of Birth: Mar 16, 1966  Today's Date: 11/27/2020  Hospital Problems: Principal Problem:   Cervical myelopathy [facility])  Past Medical History:  Past Medical History:  Diagnosis Date  . Hypertension   . Legally blind 2007   due to macular degeneration    Past Surgical History:  Past Surgical History:  Procedure Laterality Date  . ANTERIOR CERVICAL DECOMP/DISCECTOMY FUSION N/A 11/19/2020   Procedure: ANTERIOR CERVICAL DECOMPRESSION FUSION CERVICAL FIVE-SIX, CERVICAL SIX-SEVEN;  Surgeon: Dawley, Theodoro Doing, DO;  Location: [facility];  Service: Neurosurgery;  Laterality: N/A;  anterior  . NO PAST SURGERIES     Social History:  reports that he has quit smoking. He has quit using smokeless tobacco. He reports current alcohol use of about 2.0 - 3.0 standard drinks of alcohol per week. He reports that he does not use drugs.  Family / Support Systems Marital Status: Married How Long?: 3 years Patient Roles: Spouse Spouse/Significant Other: Wayne Mckinney [phone] Children: 3 adult children that live in [place] Other Supports: none reported Anticipated Caregiver: wife Ability/Limitations of Caregiver: Pt wife is legally blind and does not have transportation. They both utilize [employer] transportation services Caregiver Availability: 24/7 Family Dynamics: Pt lives with his and both cnotinue to work Full time  Social History Preferred language: English Religion: Baptist Cultural Background: Pt relocated from [place]. Education: some college Read: Yes (Pt is legally blind and does not read braile) Write: Yes Employment Status: Employed Date Retired/Disabled/Unemployed: SSDI Name of Employer: [employer] Length of Employment: 5 (years) Return to Work Plans: Pt would like to return to work when able Public relations account executive Issues: Denies Guardian/Conservator: N/A    Abuse/Neglect Abuse/Neglect Assessment Can Be Completed: Yes Physical Abuse: Denies Verbal Abuse: Denies Sexual Abuse: Denies Exploitation of patient/patient's resources: Denies Self-Neglect: Denies  Emotional Status Pt's affect, behavior and adjustment status: Pt in good spirits at time of visit Recent Psychosocial Issues: Denies Psychiatric History: denies Substance Abuse History: Pt admits that he quit smoking cigarettes 4 years ago. Pt admits to daily Etoh use (beer and airplane nips): 3-4 nips per day and 6pk or 8pk of 12oz beer cans per day.  Patient / Family Perceptions, Expectations & Goals Pt/Family understanding of illness & functional limitations: Pt and family have general understanding of care needs Premorbid pt/family roles/activities: Independent Anticipated changes in roles/activities/participation: Assistance with ADLs/iADLs Pt/family expectations/goals: Pt goal is to be as independent as possible  Recruitment consultant: None Premorbid Home Care/DME Agencies: None Transportation available at discharge: TBD Resource referrals recommended: Neuropsychology  Discharge Planning Living Arrangements: Spouse/significant other Support Systems: Spouse/significant other Type of Residence: Private residence Administrator, sports: Multimedia programmer (specify) ([place] [employer]) Financial Resources: Engineer, petroleum Screen Referred: No Living Expenses: Education officer, community Management: Patient Does the patient have any problems obtaining your medications?: No Home Management: patient managed all cooking/cleaning in the home Patient/Family Preliminary Plans: TBD Care Coordinator Barriers to Discharge: Decreased caregiver support,Lack of/limited family support Care Coordinator Anticipated Follow Up Needs: HH/OP  Clinical Impression SW met with pt in room to introduce self, explain role, and discuss discharge process. Pt is not a English as a second language teacher. No HCPOA. DME:  w/c and RW were given to him; has raised toilet seat.   Kenzly Rogoff A Grettel Rames 11/27/2020, 12:20 PM

## 2020-11-27 NOTE — Progress Notes (Signed)
Occupational Therapy Session Note  Patient Details  Name: Wayne Mckinney MRN: [id number] Date of Birth: 09/09/1966  Today's Date: 11/27/2020 OT Individual Time: 6815-9470 OT Individual Time Calculation (min): 62 min    Short Term Goals: Week 1:  OT Short Term Goal 1 (Week 1): LTGs=STGs due to ELOS   Skilled Therapeutic Interventions/Progress Updates:    Pt greeted at time of session sitting up in recliner agreeable to OT session, no pain. Pt did have some mild knee discomfort later in session, no # given and rest breaks provided PRN. Agreeable to ADL, walked chair > sink CGA with RW before sitting in wheelchair to perform UB/LB bathing with CGA overall, including sit to stand level at sink for washing buttocks which he was nervous about but did well. UB/LB dress with CGA as well, pt able to use tactile cues to find front/back d/t blindness. Extended time for ADL as pt needed more time to compensate for blindness. Ambulated sink > recliner CGA with RW. Note pt able to remember compensatory techniques from previous sessions to decrease amount of time standing to prevent knee discomfort. Pt up in recliner with all needs met, call bell in reach.    Therapy Documentation Precautions:  Precautions Precautions: Fall,Cervical Precaution Booklet Issued:  (discussed precautions) Precaution Comments: Blind Required Braces or Orthoses: Cervical Brace Cervical Brace: Hard collar,Other (comment) (don in sitting, ok to doff in bed) Restrictions Weight Bearing Restrictions: No     Therapy/Group: Individual Therapy  Viona Gilmore 11/27/2020, 10:23 AM

## 2020-11-27 NOTE — Evaluation (Signed)
Recreational Therapy Assessment and Plan  Patient Details  Name: Wayne Mckinney MRN: [id number] Date of Birth: [birth year] Today's Date: 11/27/2020  Rehab Potential:  Good ELOS:   d/c 1/26  Assessment  Hospital Problem: Active Problems:   Cervical myelopathy [facility])   Past Medical History:      Past Medical History:  Diagnosis Date  . Hypertension   . Legally blind 2007   due to macular degeneration    Past Surgical History:       Past Surgical History:  Procedure Laterality Date  . ANTERIOR CERVICAL DECOMP/DISCECTOMY FUSION N/A 11/19/2020   Procedure: ANTERIOR CERVICAL DECOMPRESSION FUSION CERVICAL FIVE-SIX, CERVICAL SIX-SEVEN;  Surgeon: Dawley, Theodoro Doing, DO;  Location: [facility];  Service: Neurosurgery;  Laterality: N/A;  anterior  . NO PAST SURGERIES      Assessment & Plan Clinical Impression:  Wayne Mckinney is a 55 year old male with history of HTN, macular degeneration with visual loss, multiple falls that started 3-4 weeks ago with BLE weakness and numbness as well as progressive balance deficits. Work up revealed cervical spondylosis with C6/C7 disc osteophyte with disc protrusion and severe canal stenosis and cord compression with edema and/or myelomalacia, moderate canal stenosis C5/C6 and moderate end plate edema W9/Q7 and C6/C7. He was admitted on 11/20/19 for ACDF C5-C7 by Dr. Reatha Armour. Post op to wear hard collar and therapy initiated. On 01/13, he has episode of unresponsiveness with therapy with eye rolling backwards, seizure type activity and loss of B/B control. CT head negative for acute changes and low dose Keppra added due to concerns of seizure. Therapy ongoing and patient noted to have limitations due to ataxia with LLE>RLE weakness, numbness and balance deficits. CIR recommended due to functional decline. He is currently ambulating 250 feet MinA with PT and is expected to have a 5-7 day stay. Pain is currently well controlled with medication. Patient  transferred to CIR on 11/23/2020.   Met with pt today to discuss TR services, leisure education, activity analysis/modifications and community resources. Pt presents with decreased activity tolerance, decreased functional mobility, decreased balance, decreased coordination, decreased vision due to legal blindness Limiting pt's independence with leisure/community pursuits.    Plan  Min 1 TR session >20 minutes per week during LOS  Recommendations for other services: None   Discharge Criteria: Patient will be discharged from TR if patient refuses treatment 3 consecutive times without medical reason.  If treatment goals not met, if there is a change in medical status, if patient makes no progress towards goals or if patient is discharged from hospital.  The above assessment, treatment plan, treatment alternatives and goals were discussed and mutually agreed upon: by patient  Van 11/27/2020, 3:29 PM

## 2020-11-28 ENCOUNTER — Inpatient Hospital Stay (HOSPITAL_COMMUNITY): Payer: BC Managed Care – PPO | Admitting: *Deleted

## 2020-11-28 ENCOUNTER — Inpatient Hospital Stay (HOSPITAL_COMMUNITY): Payer: BC Managed Care – PPO | Admitting: Occupational Therapy

## 2020-11-28 ENCOUNTER — Inpatient Hospital Stay (HOSPITAL_COMMUNITY): Payer: BC Managed Care – PPO | Admitting: Physical Therapy

## 2020-11-28 MED ORDER — LIDOCAINE HCL 1 % IJ SOLN
10.0000 mL | Freq: Once | INTRAMUSCULAR | Status: AC
  Administered 2020-11-28: 10 mL
  Filled 2020-11-28: qty 10

## 2020-11-28 MED ORDER — LIDOCAINE 4 % EX CREA
TOPICAL_CREAM | Freq: Once | CUTANEOUS | Status: AC
  Filled 2020-11-28: qty 5

## 2020-11-28 MED ORDER — TRIAMCINOLONE ACETONIDE 40 MG/ML IJ SUSP
40.0000 mg | Freq: Once | INTRAMUSCULAR | Status: AC
  Administered 2020-11-28: 40 mg via INTRAMUSCULAR
  Filled 2020-11-28: qty 1

## 2020-11-28 NOTE — Progress Notes (Signed)
[place] PHYSICAL MEDICINE & REHABILITATION PROGRESS NOTE   Subjective/Complaints:  Pt reports had a large BM yesterday- refused Mg citrate, but took another dose of Sorbitol.   Also, admits to me and PT, L knee is really giving him issues- in spite of pain meds, but very scared about an injection discussed AT LENGTH- for >12 minutes the way injections usually go- length and width of needle, etc  ROS:   Pt denies SOB, abd pain, CP, N/V/C/D, and vision changes   Objective:   No results found. No results for input(s): WBC, HGB, HCT, PLT in the last 72 hours. No results for input(s): NA, K, CL, CO2, GLUCOSE, BUN, CREATININE, CALCIUM in the last 72 hours.  Intake/Output Summary (Last 24 hours) at 11/28/2020 0918 Last data filed at 11/28/2020 0708 Gross per 24 hour  Intake 600 ml  Output 1325 ml  Net -725 ml        Physical Exam: Vital Signs Blood pressure 126/77, pulse 67, temperature 97.8 F (36.6 C), temperature source Oral, resp. rate 16, height 6\' 2"  (1.88 m), SpO2 100 %. Gen: awake, alert, blind, playing with TV channels, in bedside chair, NAD HEENT: oral mucosa pink and moist, NCAT. Blind from macular degeneration. 1 tooth sticks out slightly in front- no change Cardio: RRR Chest: CTA B/L- no W/R/R- good air movement Abd: soft, NT, slightly distended but better; hypoactive BS   Ext: no edema Psych: appropriate Skin: intact Neuro: Musculoskeletal:        General: Swelling present.     Comments: Left knee with mild to moderate effusion and skin changes due to resolving ecchymosis? Tender on medial joint line and tibial plateau. Right knee with minimal edema but also TTP on tibial plateau and medial joint line  -still TTP over medial aspect of L knee more than anywhere on knee.  Neurological: Ox3   5/5 strength in upper extremities. 4/5 strength in lower extremities. Sensation is intact.    Assessment/Plan: 1. Functional deficits which require 3+ hours per day of  interdisciplinary therapy in a comprehensive inpatient rehab setting.  Physiatrist is providing close team supervision and 24 hour management of active medical problems listed below.  Physiatrist and rehab team continue to assess barriers to discharge/monitor patient progress toward functional and medical goals  Care Tool:  Bathing    Body parts bathed by patient: Right arm,Left arm,Chest,Abdomen,Front perineal area,Buttocks,Right upper leg,Left upper leg,Face,Right lower leg,Left lower leg   Body parts bathed by helper: Right lower leg,Left lower leg     Bathing assist Assist Level: Contact Guard/Touching assist     Upper Body Dressing/Undressing Upper body dressing   What is the patient wearing?: Pull over shirt    Upper body assist Assist Level: Set up assist    Lower Body Dressing/Undressing Lower body dressing      What is the patient wearing?: Underwear/pull up,Pants     Lower body assist Assist for lower body dressing: Contact Guard/Touching assist     Toileting Toileting Toileting Activity did not occur (Clothing management and hygiene only): N/A (no void or bm)  Toileting assist Assist for toileting: Independent with assistive device Assistive Device Comment: urinal   Transfers Chair/bed transfer  Transfers assist     Chair/bed transfer assist level: Minimal Assistance - Patient > 75%     Locomotion Ambulation   Ambulation assist      Assist level: Contact Guard/Touching assist Assistive device: Walker-rolling Max distance: 150   Walk 10 feet activity  Assist     Assist level: Contact Guard/Touching assist Assistive device: Walker-rolling   Walk 50 feet activity   Assist    Assist level: Contact Guard/Touching assist Assistive device: Walker-rolling    Walk 150 feet activity   Assist    Assist level: Contact Guard/Touching assist Assistive device: Walker-rolling    Walk 10 feet on uneven surface  activity   Assist  Walk 10 feet on uneven surfaces activity did not occur: Safety/medical concerns         Wheelchair     Assist Will patient use wheelchair at discharge?: No             Wheelchair 50 feet with 2 turns activity    Assist            Wheelchair 150 feet activity     Assist          Blood pressure 126/77, pulse 67, temperature 97.8 F (36.6 C), temperature source Oral, resp. rate 16, height 6\' 2"  (1.88 m), SpO2 100 %.  Medical Problem List and Plan: 1.  Impaired mobility and ADLs secondary to cervical myelopathy s/p ACDF             -patient may shower but spinal incision must be covered.              -ELOS/Goals: modI in 5-7 days  Initial CIR evaluations today. 2.  Antithrombotics: -DVT/anticoagulation:  Pharmaceutical: Heparin             -antiplatelet therapy: N/A 3. Pain Management: Hydrocodone prn seems to be effective. Will continue robaxin prn for back muscle spasms. Using approximately twice per day. Discussed decreasing frequency to q6H prn and patient is agreeable.  1/18- added voltaren gel for knee pain QID- if doesn't help enough, will need steroid injections for B/L knee pain  1/19- pt said voltaren is working enough- doesn't want injections right now  1/20- we discussed at length- will do steroid injection of L knee- today- at 10:30am 4. Mood: LCSW to follow for evaluation and support.              -antipsychotic agents: N/A 5. Neuropsych: This patient is capable of making decisions on his own behalf. 6. Skin/Wound Care: Routine pressure relief measures. Continue surgical dressing for now.   1/18- d/c honeycomb dressing and place dry dressing daily.  7. Fluids/Electrolytes/Nutrition: Monitor I/O. Electrolytes stable 1/16  8. HTN: Monitor BP tid. Monitor for orthostatic changes. Currently under excellent control- continue Norvasc and Microzide. 9. Seizure prophylaxis: Continue keppra 500 mg bid-->should help with neuropathy also.   1/17- also  c/o lisp- explained can take up to 2 months or so to go away sometimes.  10. Constipation: Last received senna-docusate 1/12.  1/17- LBM 2 days ago- denies constipation  1/18- LBM at least 3-4 days ago- will order sorbitol for bowels- since nothing documented.   1/19- didn't have any documented BM- will order Mg citrate after therapy.   1/20- refused mg citrate, but took another dose of Sorbitol- had extra large BM- but says 1/2 empty now 11. Hypoalbuminemia: encourage high protein foods  12. B/L L>R knee pain  1/17- will order voltaren gel QID for now- doesn't want surgery- since on SQ Heparin, wary to start NSAID for bleeding risk.   1/18- wait today on knee injections- see how topical voltaren helping.  1/19- thinks helping enough- doesn't want injections right now  1/20- will injection L knee today- will also place lidocaine cream at  9:30am to help with pain of injection.    steroid injection was performed at L knee using 1% plain Lidocaine and 40mg  /1cc of Kenalog. This was well tolerated.  Cleaned with betadine x3 and allowed to dry- then alcohol then injected using 27 gauge 1.5 inch needle- no bleeding or complications.    F/U in 3 months for steroid injections of L knee Lidocaine will kick in 15 minutes- and wear off tonight- the steroid will kick in tomorrow within 24 hours and take up to 72 hours to fully kick in.     I spent a total of 40 minutes on total care today- >50% spent on coordination of care.   LOS: 5 days A FACE TO FACE EVALUATION WAS PERFORMED  Omega Slager 11/28/2020, 9:18 AM

## 2020-11-28 NOTE — Progress Notes (Signed)
Patient ID: Wayne Mckinney, male   DOB: 1966/07/16, 55 y.o.   MRN: [id number]  SW met with pt in room to discuss HHA preference. No HHA preference.   SW sent HHPT/OT referral to [facility] with [place]. SW waiting on follow-up.  Loralee Pacas, MSW, [place] Office: [phone] Cell: [phone] Fax: [phone]

## 2020-11-28 NOTE — Progress Notes (Signed)
Recreational Therapy Session Note  Patient Details  Name: Wayne Mckinney MRN: [id number] Date of Birth: 09/27/1966 Today's Date: 11/28/2020  Pain: no c/o Skilled Therapeutic Interventions/Progress Updates: Session focused on continued pt education.  LRT reviewed an available community leisure resource with pt and provided details on programs available.  Pt stated understanding of this information and already had a connection to an aspect of the program PTA.    No further TR as pt is discharging 1/26.  Education complete.    [name] 11/28/2020, 4:07 PM

## 2020-11-28 NOTE — Progress Notes (Incomplete)
Recreational Therapy Session Note  Patient Details  Name: Wayne Mckinney MRN: [id number] Date of Birth: Dec 27, 1965 Today's Date: 11/28/2020  Pain: no c/o Skilled Therapeutic Interventions/Progress Updates: Session focused on community resource education.   Therapy/Group: Individual Therapy   Herta Hink 11/28/2020, 3:24 PM

## 2020-11-28 NOTE — Progress Notes (Signed)
Occupational Therapy Session Note  Patient Details  Name: Wayne Mckinney MRN: [id number] Date of Birth: [birth year]  Today's Date: 11/28/2020 OT Individual Time: 1045-1130 OT Individual Time Calculation (min): 45 min    Short Term Goals: Week 1:  OT Short Term Goal 1 (Week 1): LTGs=STGs due to ELOS  Skilled Therapeutic Interventions/Progress Updates:    Pt received in recliner sitting with ice on his knee after knee injection,  He stated he was not uncomfortable. He wanted to wash up but continues to decline a shower here due to small space.   Pt used RW to ambulated from recliner to wc at sink with CGA.  He completed bathing with S except for therapist helping him doffing and donning c collar to wash neck.  Pt dressed with S UB, CGA LB.  He stated he has already toileted.  Assisted pt with reading menu for him so he could order his lunch.  Pt returned to recliner. Resting in chair with belt alarm on and all needs met.  Therapy Documentation Precautions:  Precautions Precautions: Fall,Cervical Precaution Booklet Issued:  (discussed precautions) Precaution Comments: Blind Required Braces or Orthoses: Cervical Brace Cervical Brace: Hard collar,Other (comment) (don in sitting, ok to doff in bed) Restrictions Weight Bearing Restrictions: No   Pain: pt had just received knee injection and was keeping ice on knee, he reported pain was tolerable.      Therapy/Group: Individual Therapy  [place] 11/28/2020, 1:19 PM

## 2020-11-28 NOTE — Progress Notes (Addendum)
Physical Therapy Session Note  Patient Details  Name: Wayne Mckinney MRN: [id number] Date of Birth: 1966/04/15  Today's Date: 11/28/2020 PT Individual Time: 0867-6195 and 0932-6712 PT Individual Time Calculation (min): 61 min and 69 min  Short Term Goals: Week 1:  PT Short Term Goal 1 (Week 1): =LTG due to ELOS  Skilled Therapeutic Interventions/Progress Updates:    Session 1: Pt received sitting in recliner and agreeable to therapy session. [place] J collar in place throughout session. Therapist provides verbal and tactile cuing throughout session to assist pt with navigating environment due to visual impairments. Pt providing therapist with extensive information on L knee pain stating he is unable to tolerate full knee extension and has increased pain during standing/ambulating. Therapist reinforced prior education on importance of maintaining full knee ROM and educated on performing open chain exercises via seated long arc quads or supine heel slides focusing on extension as well as trying to keep knee extended when resting in the bed. Sit<>stands using RW with CGA for steadying during session - pt demos slow transition to stand with delayed hip/knee extension staying in prolonged squat position. Gait training ~138ft to day room using RW with CGA for steadying - pt started with step-to pattern via pushing RW forward and then stepping to it relying on B UE WBing down through AD to offset weight from LEs then sliding feet forward on the ground during swing - max cuing to progress to reciprocal gait pattern with closer AD management to increase upright trunk posture, pt demos narrow BOS with slight scissoring and continued lack of B LE foot clearance during swing - further cuing for improvement with minor changes noted. B LE reciprocal movement patterns on Nustep targeting B LE strengthening for totaling 195steps against level 5 resistance - max cuing for increased L quad muscle activation having pt  palpate for biofeedback. Gait training ~154ft to main therapy gym using RW with pt able to carryover reciprocal stepping pattern but continues to have forward AD management, excessive trunk flexion, increased WBing through UEs, lack of foot clearance during swing sliding feet forward, and narrow BOS - continued max cuing for improvement. Ascended/descened 8 steps using B HRs with CGA for steadying - cuing for step-to pattern leading with R LE on ascent and L LE on descent - pt does not demo significant reliance on B UE WBing during this task (as would have been anticipated based on pt's ambulation). Gait training ~262ft back to room using RW with CGA and further cuing for improved gait impairments as noted above with pt stating he has increased "twinge" in L knee when standing more upright with improved reciprocal stepping. At end of session pt left seated in recliner with needs in reach.  Session 2: Pt received sitting in recliner asleep but easily awakens and with minimal encouragement agreeable to therapy session - reports he is tired after this AM and having just eaten lunch. Wearing [place] J collar throughout session. Pt reports receiving injection in L knee by MD with no adverse effects; however, pt states MD educated the benefits of the injection will not take effect until tomorrow but no restrictions on mobility. Therapist provides verbal and tactile cuing throughout session to assist pt with navigating environment due to visual impairments. Sit<>stands to/from w/c or recliner using RW with CGA progressing towards supervision - pt with very slow transitions maintaining a squat position while moving hands to/from armrests and AD but demos good LE strength during this task. Stand pivot recliner>w/c  using RW with CGA for safety - continues to shuffle feet on ground and rely significantly on B UE WBing down through AD when standing on L LE due to fear of pain. Transported to/from gym in w/c for time management  and energy conservation. Gait training ~238ft using RW with CGA for safety - again starts with step-to pattern pushing AD forward then stepping towards it with R then L LE in order to off weight LEs using B UEs on AD causing significant forward lean - cuing to perform reciprocal stepping pattern with decreased B UE WBing on AD and improved upright posture, pt again states feeling a "twinge" in L knee when ambulating reciprocally but with education on benefits and purpose able to tolerate mild improvement in gait mechanics though continues to have above compensations with decreased B LE foot clearance. Standing with B UE support on RW performed repeated L LE foot taps on/off 8" step with CGA for steadying; cuing for keeping AD closer to promote increased upright posture and increased glute activation - progressed to R UE on RW and L HHA to decrease UE support with same CGA for steadying. Repeated same task tapping R LE on/off with B UE support on RW and pt increasing WBing through UEs to offset weight from L LE due to knee pain/fear of pain. Static standing balance progressed from only R UE support on RW to no UE support with CGA progressing towards min assist due to minor anterior/posteroir postural sway turning into progressively worsening anterior lean though pt demos excellent balance compensations to quickly put hands back on RW and correct balance with only CGA for safety - discussed progressing towards more normal balance recovery strategies (ankle vs hip strategy). Pt inquiring about exercises to help strengthen B LEs: performed seated long arc quads wearing 7lb ankle weight 2x10 reps with pt not reporting any knee pain - educated pt on performing same exercise but without weight in his room outside of therapy. R/L lateral side stepping at hallway rail with B UE support ~2ft down/back and CGA for steadying - reports a L knee "twinge" when stepping towards L. Transported back to room in w/c an d stand pivot  back to recliner using RW as described above. Pt left seated in recliner with needs in reach. Addendum: Pt states that there is a $300 transfer fee to be moved to a 1st floor apartment so he will have to stay in his current apartment on 2nd floor.  Therapy Documentation Precautions:  Precautions Precautions: Fall,Cervical Precaution Booklet Issued:  (discussed precautions) Precaution Comments: Blind Required Braces or Orthoses: Cervical Brace Cervical Brace: Hard collar,Other (comment) (don in sitting, ok to doff in bed) Restrictions Weight Bearing Restrictions: No  Pain: Session 1: Reports L knee pain with "twinges" when moving certain ways - provided rest breaks and adjustments to interventions for pain management. Addendum: Educated pt on recommendation to follow-up with Orthopedic MD after D/C to discuss L knee.  Session 2: Reports not feeling any significant change in his L knee pain level after the injection this AM and states he continues to feel a "twinge" when moving certain ways. Provided rest breaks and adjusted interventions for pain management.   Therapy/Group: Individual Therapy  Ginny Forth , PT, DPT, CSRS  11/28/2020, 7:55 AM

## 2020-11-29 ENCOUNTER — Inpatient Hospital Stay (HOSPITAL_COMMUNITY): Payer: BC Managed Care – PPO | Admitting: Physical Therapy

## 2020-11-29 ENCOUNTER — Inpatient Hospital Stay (HOSPITAL_COMMUNITY): Payer: BC Managed Care – PPO | Admitting: Occupational Therapy

## 2020-11-29 ENCOUNTER — Encounter (HOSPITAL_COMMUNITY): Payer: Self-pay | Admitting: Neurological Surgery

## 2020-11-29 MED ORDER — MAGNESIUM CITRATE PO SOLN
1.0000 | Freq: Once | ORAL | Status: AC
  Administered 2020-11-29: 1 via ORAL
  Filled 2020-11-29: qty 296

## 2020-11-29 NOTE — Progress Notes (Signed)
Physical Therapy Session Note  Patient Details  Name: Wayne Mckinney MRN: [id number] Date of Birth: 12/28/1965  Today's Date: 11/29/2020 PT Individual Time: 0805-0905 PT Individual Time Calculation (min): 60 min   Short Term Goals: Week 1:  PT Short Term Goal 1 (Week 1): =LTG due to ELOS  Skilled Therapeutic Interventions/Progress Updates:    Pt received sitting in WC and agreeable to PT. PT applied Voltaren gel to Bil knees per pt request. Pt performed sit<>stand transfers throughout session with RW and supervision assist from PT with min cues for location of RW.   PT instructed pt in dynamic balance training:  Standing on level surface without UE support 2x15 sec. Beach ball raise with min assist x 8. Standing on airex pad with BUE support x 30 sec, with 1 UE 2 x 15 sec bil, finger tip support with BUE x 30 sec. No UE support 2 x 10 sec. CGA progressing to min assist from PT with decreased UE support with more challenging balance activities. Pt able to utilize ankle strategy throughout balance training to prevent A/P LOB.    Gait training with RW x47f, 183fand 9033fith supervision assist and for safety and min cues for direction due to visual deficits as well as improved step height and heel contact for BLE. Pt able to follow commands for improved step height 75% of gait training.   Patient returned to room and performed ambulatoryt to recliner with supervision assist and RW. Pt left sitting in recliner with call bell in reach and all needs met.          Therapy Documentation Precautions:  Precautions Precautions: Fall,Cervical Precaution Booklet Issued:  (discussed precautions) Precaution Comments: Blind Required Braces or Orthoses: Cervical Brace Cervical Brace: Hard collar,Other (comment) (don in sitting, ok to doff in bed) Restrictions Weight Bearing Restrictions: No Pain: Pain Assessment Pain Scale: 0-10 Pain Score: 4  Pain Type: Acute pain Pain Location:  Back Pain Orientation: Lower Pain Descriptors / Indicators: Aching;Constant Pain Frequency: Constant Pain Intervention(s): Medication (See eMAR)    Therapy/Group: Individual Therapy  AusLorie Phenix21/2022, 9:05 AM

## 2020-11-29 NOTE — Progress Notes (Addendum)
Patient ID: Wayne Mckinney, male   DOB: September 14, 1966, 55 y.o.   MRN: [id number]  SW received updates from [facility] reporting referral for HHPT/OT accepted. SW faxed order to [facility] [phone].  SW met with pt in room to provide updates on above.   Loralee Pacas, MSW, [place] Office: [phone] Cell: [phone] Fax: [phone]

## 2020-11-29 NOTE — Progress Notes (Signed)
Physical Therapy Session Note  Patient Details  Name: Wayne Mckinney MRN: [id number] Date of Birth: February 03, 1966  Today's Date: 11/29/2020 PT Individual Time: 1000-1110 PT Individual Time Calculation (min): 70 min   Short Term Goals: Week 1:  PT Short Term Goal 1 (Week 1): =LTG due to ELOS  Skilled Therapeutic Interventions/Progress Updates:    pt received in recliner and agreeable to therapy. Pt reported Lt knee "twinge" at start of session with prolonged standing, straightening knee. Pt denied pain at start and end of session. Pt consistently requires manual A for walker placement, environment navigation 2/2 blindness. Pt directed in Sit to stand to Rolling walker CGA, gait training to day room 150' CGA with only manual assist for walker navigation 2/2 poor vision. Pt directed in seated BLE strengthening exercises of marching, LAQ 2x10 with 7# then standing without added resistance for x10 squats with BUE support, x10 hip abduction and adduction, marches, hip extension with seated rest break once. Pt directed in Nustep 6 mins L8 with pt reported fatigue but no added pain at this time. Pt directed in static standing balance training with Rolling walker to come to standing however did not have external support for 4x45s-77min for improved standing balance and decreased fall risk, VC for increased core and glute activation for improved pelvic stability. Pt directed in gait training back to room, with Rolling walker VC for trunk extension as tolerated, walker proximity and manual assist for environment navigation 2/2 vision limitations. Pt returned to room, requested to sit in recliner, left sitting in recliner. Requested ice pack with PT retrieving fresh ice in pack and assisted in placement and left in sitting, All needs in reach and in good condition. Call light in hand.  And pt denied further needs.   Therapy Documentation Precautions:  Precautions Precautions: Fall,Cervical Precaution Booklet  Issued:  (discussed precautions) Precaution Comments: Blind Required Braces or Orthoses: Cervical Brace Cervical Brace: Hard collar,Other (comment) (don in sitting, ok to doff in bed) Restrictions Weight Bearing Restrictions: No General:   Vital Signs:   Pain: Pain Assessment Pain Scale: 0-10 Pain Score: 0-No pain Mobility:   Locomotion :    Trunk/Postural Assessment :    Balance:   Exercises:   Other Treatments:      Therapy/Group: Individual Therapy  Barbaraann Faster 11/29/2020, 12:37 PM

## 2020-11-29 NOTE — Addendum Note (Signed)
Addendum  created 11/29/20 1321 by Achille Rich, MD   Intraprocedure Event edited, Intraprocedure Staff edited

## 2020-11-29 NOTE — Progress Notes (Signed)
Occupational Therapy Session Note  Patient Details  Name: Wayne Mckinney MRN: [id number] Date of Birth: 25-Apr-1966  Today's Date: 11/29/2020 OT Individual Time: 1345-1430 OT Individual Time Calculation (min): 45 min    Short Term Goals: Week 1:  OT Short Term Goal 1 (Week 1): LTGs=STGs due to ELOS  Skilled Therapeutic Interventions/Progress Updates:    Pt sitting up in recliner, reporting pain in left knee is mild and cold pack is helping.  Pt reports he already washed up today with nurse because he wanted to be clean before his physical therapy sessions.  Discussed pts bathroom layout and pt reports good understanding of safety strategies and equipment needs for shower level bathing.  Pt also able to recall 3/3 back precautions independently.  Educated pt on use of sock aide and reacher to donn/doff socks and shoes with increased independence through use of didactic instruction and TCs during demonstration.  Pt return demonstrated donning/donning socks and shoes with supervision and increased time.  Educated pt on procurement options for reacher and sock aide and pt reports interest in future use upon discharge to home.  Call bell in reach, seat alarm on.  Therapy Documentation Precautions:  Precautions Precautions: Fall,Cervical Precaution Booklet Issued:  (discussed precautions) Precaution Comments: Blind Required Braces or Orthoses: Cervical Brace Cervical Brace: Hard collar,Other (comment) (don in sitting, ok to doff in bed) Restrictions Weight Bearing Restrictions: No   Therapy/Group: Individual Therapy  Amie Critchley 11/29/2020, 2:36 PM

## 2020-11-29 NOTE — Progress Notes (Signed)
[name] PROGRESS NOTE   Subjective/Complaints:  Pt reports injection went "GREAT"- wasn't half as bad as expected.   Pain somewhat better, even though doesn't usually kick in until later this AM.   LBM 2 days ago and at the time was "1/2 full". Discussed sorbitol vs Mg citrate- he wants mg citrate today after therapy.   ROS:   Pt denies SOB, abd pain, CP, N/V/C/D, and vision changes   Objective:   No results found. No results for input(s): WBC, HGB, HCT, PLT in the last 72 hours. No results for input(s): NA, K, CL, CO2, GLUCOSE, BUN, CREATININE, CALCIUM in the last 72 hours.  Intake/Output Summary (Last 24 hours) at 11/29/2020 0856 Last data filed at 11/29/2020 0453 Gross per 24 hour  Intake 240 ml  Output 1700 ml  Net -1460 ml        Physical Exam: Vital Signs Blood pressure 129/84, pulse 61, temperature 97.8 F (36.6 C), resp. rate 17, height 6\' 2"  (1.88 m), SpO2 100 %. Gen: awake, in gym, blind, appropriate, with PT, NAD HEENT: oral mucosa pink and moist, NCAT. Blind from macular degeneration. 1 tooth sticks out slightly in front- no change Cardio: borderline bradycardia- regular rhythm Chest: CTA B/L- no W/R/R- good air movement Abd: soft, NT, slightly distended; hypoactive BS   Ext: no edema Psych: smiling, bright affect Skin: intact Neuro: Musculoskeletal:        General: Swelling present.     Comments: Left knee with mild to moderate effusion and skin changes due to resolving ecchymosis? Tender on medial joint line and tibial plateau. Right knee with minimal edema but also TTP on tibial plateau and medial joint line  -less TTP over L knee/medial joint line Neurological: Ox3   5/5 strength in upper extremities. 4/5 strength in lower extremities. Sensation is intact.    Assessment/Plan: 1. Functional deficits which require 3+ hours per day of interdisciplinary therapy in a comprehensive inpatient rehab  setting.  Physiatrist is providing close team supervision and 24 hour management of active medical problems listed below.  Physiatrist and rehab team continue to assess barriers to discharge/monitor patient progress toward functional and medical goals  Care Tool:  Bathing    Body parts bathed by patient: Right arm,Left arm,Chest,Abdomen,Front perineal area,Buttocks,Right upper leg,Left upper leg,Face,Right lower leg,Left lower leg   Body parts bathed by helper: Right lower leg,Left lower leg     Bathing assist Assist Level: Contact Guard/Touching assist     Upper Body Dressing/Undressing Upper body dressing   What is the patient wearing?: Pull over shirt    Upper body assist Assist Level: Set up assist    Lower Body Dressing/Undressing Lower body dressing      What is the patient wearing?: Underwear/pull up,Pants     Lower body assist Assist for lower body dressing: Contact Guard/Touching assist     Toileting Toileting Toileting Activity did not occur (Clothing management and hygiene only): N/A (no void or bm)  Toileting assist Assist for toileting: Independent with assistive device Assistive Device Comment: urinal   Transfers Chair/bed transfer  Transfers assist     Chair/bed transfer assist level: Contact Guard/Touching assist Chair/bed transfer assistive device:   Ambulation assist      Assist level: Contact Guard/Touching assist Assistive device: Walker-rolling Max distance: 271ft   Walk 10 feet activity   Assist     Assist level: Contact Guard/Touching assist Assistive device: Walker-rolling   Walk 50  feet activity   Assist    Assist level: Contact Guard/Touching assist Assistive device: Walker-rolling    Walk 150 feet activity   Assist    Assist level: Contact Guard/Touching assist Assistive device: Walker-rolling    Walk 10 feet on uneven surface  activity   Assist Walk 10 feet  on uneven surfaces activity did not occur: Safety/medical concerns         Wheelchair     Assist Will patient use wheelchair at discharge?: No             Wheelchair 50 feet with 2 turns activity    Assist            Wheelchair 150 feet activity     Assist          Blood pressure 129/84, pulse 61, temperature 97.8 F (36.6 C), resp. rate 17, height 6\' 2"  (1.88 m), SpO2 100 %.  Medical Problem List and Plan: 1.  Impaired mobility and ADLs secondary to cervical myelopathy s/p ACDF             -patient may shower but spinal incision must be covered.              -ELOS/Goals: modI in 5-7 days  Initial CIR evaluations today. 2.  Antithrombotics: -DVT/anticoagulation:  Pharmaceutical: Heparin             -antiplatelet therapy: N/A 3. Pain Management: Hydrocodone prn seems to be effective. Will continue robaxin prn for back muscle spasms. Using approximately twice per day. Discussed decreasing frequency to q6H prn and patient is agreeable.  1/18- added voltaren gel for knee pain QID- if doesn't help enough, will need steroid injections for B/L knee pain  1/19- pt said voltaren is working enough- doesn't want injections right now  1/20- we discussed at length- will do steroid injection of L knee- today- at 10:30am  1/21- injection has been helpful so far- explained cannot get again for 3 months.  4. Mood: LCSW to follow for evaluation and support.              -antipsychotic agents: N/A 5. Neuropsych: This patient is capable of making decisions on his own behalf. 6. Skin/Wound Care: Routine pressure relief measures. Continue surgical dressing for now.   1/18- d/c honeycomb dressing and place dry dressing daily.  7. Fluids/Electrolytes/Nutrition: Monitor I/O. Electrolytes stable 1/16  8. HTN: Monitor BP tid. Monitor for orthostatic changes. Currently under excellent control- continue Norvasc and Microzide. 9. Seizure prophylaxis: Continue keppra 500 mg  bid-->should help with neuropathy also.   1/17- also c/o lisp- explained can take up to 2 months or so to go away sometimes.  10. Constipation: Last received senna-docusate 1/12.  1/17- LBM 2 days ago- denies constipation  1/18- LBM at least 3-4 days ago- will order sorbitol for bowels- since nothing documented.   1/19- didn't have any documented BM- will order Mg citrate after therapy.   1/20- refused mg citrate, but took another dose of Sorbitol- had extra large BM- but says 1/2 empty now  1/21- will give Mg citrate per pt request- after therapy today.  11. Hypoalbuminemia: encourage high protein foods  12. B/L L>R knee pain  1/17- will order voltaren gel QID for now- doesn't want surgery- since on SQ Heparin, wary to start NSAID for bleeding risk.   1/18- wait today on knee injections- see how topical voltaren helping.  1/19- thinks helping enough- doesn't want injections right now  1/20- will injection  L knee today- will also place lidocaine cream at 9:30am to help with pain of injection.   1/21- pain doing much better- injection not as bad as expected.        LOS: 6 days A FACE TO FACE EVALUATION WAS PERFORMED  Wayne Mckinney 11/29/2020, 8:56 AM

## 2020-11-30 ENCOUNTER — Inpatient Hospital Stay (HOSPITAL_COMMUNITY): Payer: BC Managed Care – PPO

## 2020-11-30 NOTE — Progress Notes (Signed)
Slept good. No BM after mag citrate given on previous shift. Wanted to wait until after lunch to take any other laxatives. BS +. Using urinal independently. C/O bilateral knee pain, left > right. BLE edema LLE > RLE. Voltren applied to knees. Wayne Mckinney A

## 2020-11-30 NOTE — Progress Notes (Signed)
[facility] PHYSICAL MEDICINE & REHABILITATION PROGRESS NOTE   Subjective/Complaints:   Pt still hasn't had BM in spite of Mg citrate yesterday- wil get KUB.   Also, L knee still really bothering him, esp if turns knee and tries to stop- feels so awful, has "tack" in it.  Describes this as "tweaked' when walking.    ROS:   Pt denies SOB, abd pain, CP, N/V/C/D, and vision changes   Objective:   No results found. No results for input(s): WBC, HGB, HCT, PLT in the last 72 hours. No results for input(s): NA, K, CL, CO2, GLUCOSE, BUN, CREATININE, CALCIUM in the last 72 hours.  Intake/Output Summary (Last 24 hours) at 11/30/2020 1420 Last data filed at 11/30/2020 0748 Gross per 24 hour  Intake 660 ml  Output 1175 ml  Net -515 ml        Physical Exam: Vital Signs Blood pressure 129/76, pulse 76, temperature 98 F (36.7 C), resp. rate 17, height 6\' 2"  (1.88 m), SpO2 99 %. Gen: sleepy, in room, in w/c, NAD HEENT: oral mucosa pink and moist, NCAT. Blind from macular degeneration. 1 tooth sticks out slightly in front- no change Cardio: RRR Chest: CTA B/L- no W/R/R- good air movement Abd: soft, NT, somewhat distended, hypoactive Ext: no edema Psych: smiling, bright affect Skin: intact Neuro: Musculoskeletal:        General: Swelling present.     Comments: Left knee with mild to moderate effusion and skin changes due to resolving ecchymosis? Tender on medial joint line and tibial plateau. Right knee with minimal edema but also TTP on tibial plateau and medial joint line  -less TTP over L knee/medial joint line, but if does meniscal testing, is (+) on L Neurological: Ox3   5/5 strength in upper extremities. 4/5 strength in lower extremities. Sensation is intact.    Assessment/Plan: 1. Functional deficits which require 3+ hours per day of interdisciplinary therapy in a comprehensive inpatient rehab setting.  Physiatrist is providing close team supervision and 24 hour  management of active medical problems listed below.  Physiatrist and rehab team continue to assess barriers to discharge/monitor patient progress toward functional and medical goals  Care Tool:  Bathing    Body parts bathed by patient: Right arm,Left arm,Chest,Abdomen,Front perineal area,Buttocks,Right upper leg,Left upper leg,Face,Right lower leg,Left lower leg   Body parts bathed by helper: Right lower leg,Left lower leg     Bathing assist Assist Level: Contact Guard/Touching assist     Upper Body Dressing/Undressing Upper body dressing   What is the patient wearing?: Pull over shirt    Upper body assist Assist Level: Set up assist    Lower Body Dressing/Undressing Lower body dressing      What is the patient wearing?: Underwear/pull up,Pants     Lower body assist Assist for lower body dressing: Contact Guard/Touching assist     Toileting Toileting Toileting Activity did not occur (Clothing management and hygiene only): N/A (no void or bm)  Toileting assist Assist for toileting: Independent with assistive device Assistive Device Comment: urinal   Transfers Chair/bed transfer  Transfers assist     Chair/bed transfer assist level: Supervision/Verbal cueing Chair/bed transfer assistive device:   Ambulation assist      Assist level: Supervision/Verbal cueing Assistive device: Walker-rolling Max distance: 180   Walk 10 feet activity   Assist     Assist level: Supervision/Verbal cueing Assistive device: Walker-rolling   Walk 50 feet activity   Assist  Assist level: Supervision/Verbal cueing Assistive device: Walker-rolling    Walk 150 feet activity   Assist    Assist level: Supervision/Verbal cueing Assistive device: Walker-rolling    Walk 10 feet on uneven surface  activity   Assist Walk 10 feet on uneven surfaces activity did not occur: Safety/medical concerns          Wheelchair     Assist Will patient use wheelchair at discharge?: No             Wheelchair 50 feet with 2 turns activity    Assist            Wheelchair 150 feet activity     Assist          Blood pressure 129/76, pulse 76, temperature 98 F (36.7 C), resp. rate 17, height 6\' 2"  (1.88 m), SpO2 99 %.  Medical Problem List and Plan: 1.  Impaired mobility and ADLs secondary to cervical myelopathy s/p ACDF             -patient may shower but spinal incision must be covered.              -ELOS/Goals: modI in 5-7 days  Initial CIR evaluations today. 2.  Antithrombotics: -DVT/anticoagulation:  Pharmaceutical: Heparin             -antiplatelet therapy: N/A 3. Pain Management: Hydrocodone prn seems to be effective. Will continue robaxin prn for back muscle spasms. Using approximately twice per day. Discussed decreasing frequency to q6H prn and patient is agreeable.  1/18- added voltaren gel for knee pain QID- if doesn't help enough, will need steroid injections for B/L knee pain  1/19- pt said voltaren is working enough- doesn't want injections right now  1/20- we discussed at length- will do steroid injection of L knee- today- at 10:30am  1/21- injection has been helpful so far- explained cannot get again for 3 months.   1/22- will get L Knee MRI since still "tweaking him" so much- think he has meniscal tear- if does, might need L brace to keep knee straight.  4. Mood: LCSW to follow for evaluation and support.              -antipsychotic agents: N/A 5. Neuropsych: This patient is capable of making decisions on his own behalf. 6. Skin/Wound Care: Routine pressure relief measures. Continue surgical dressing for now.   1/18- d/c honeycomb dressing and place dry dressing daily.  7. Fluids/Electrolytes/Nutrition: Monitor I/O. Electrolytes stable 1/16  8. HTN: Monitor BP tid. Monitor for orthostatic changes. Currently under excellent control- continue Norvasc and  Microzide. 9. Seizure prophylaxis: Continue keppra 500 mg bid-->should help with neuropathy also.   1/17- also c/o lisp- explained can take up to 2 months or so to go away sometimes.  10. Constipation: Last received senna-docusate 1/12.  1/17- LBM 2 days ago- denies constipation  1/18- LBM at least 3-4 days ago- will order sorbitol for bowels- since nothing documented.   1/19- didn't have any documented BM- will order Mg citrate after therapy.   1/20- refused mg citrate, but took another dose of Sorbitol- had extra large BM- but says 1/2 empty now  1/21- will give Mg citrate per pt request- after therapy today.   1/22- took mg citrate- no results- will check KUB portable.  11. Hypoalbuminemia: encourage high protein foods  12. B/L L>R knee pain  1/17- will order voltaren gel QID for now- doesn't want surgery- since on SQ Heparin, wary to start  NSAID for bleeding risk.   1/18- wait today on knee injections- see how topical voltaren helping.  1/19- thinks helping enough- doesn't want injections right now  1/20- will injection L knee today- will also place lidocaine cream at 9:30am to help with pain of injection.   1/21- pain doing much better- injection not as bad as expected.   1/22- will get L knee MRI since think he has meniscal tear.        LOS: 7 days A FACE TO FACE EVALUATION WAS PERFORMED  Murial Beam 11/30/2020, 2:20 PM

## 2020-12-01 ENCOUNTER — Inpatient Hospital Stay (HOSPITAL_COMMUNITY): Payer: BC Managed Care – PPO

## 2020-12-01 MED ORDER — PREGABALIN 50 MG PO CAPS
50.0000 mg | ORAL_CAPSULE | Freq: Three times a day (TID) | ORAL | Status: DC
  Administered 2020-12-01 – 2020-12-04 (×10): 50 mg via ORAL
  Filled 2020-12-01 (×11): qty 1

## 2020-12-01 MED ORDER — HYDROCODONE-ACETAMINOPHEN 5-325 MG PO TABS
1.0000 | ORAL_TABLET | ORAL | Status: DC | PRN
  Administered 2020-12-01 – 2020-12-04 (×6): 1 via ORAL
  Filled 2020-12-01 (×6): qty 1

## 2020-12-01 NOTE — Progress Notes (Signed)
Physical Therapy Session Note  Patient Details  Name: Wayne Mckinney MRN: [id number] Date of Birth: 1966-02-26  Today's Date: 12/01/2020 PT Individual Time: 0905-1015 PT Individual Time Calculation (min): 70 min   Short Term Goals: Week 1:  PT Short Term Goal 1 (Week 1): =LTG due to ELOS  Skilled Therapeutic Interventions/Progress Updates:     Patient in recliner with c-collar donned with MD in the room upon PT arrival. Patient alert and agreeable to PT session. Patient denied pain in sitting, reported 7-8/10 L>R knee pain during session, RN made aware. PT provided repositioning, rest breaks, and distraction as pain interventions throughout session. RN unavailable to provide pain medicine during session. Limited standing mobility due to low pain tolerance in standing at this time. Per discussion with MD, no brace application for knee pain due to nature of pain source not impacting joint stability.   Assessed B knees with patient in sitting. Overt edema medially>laterally L>R, also B ankle edema without pitting. L knee extension lacking>10 deg in sitting with firm and painful endfeel, R lacking ~5 deg with firm endfeel without pain. Tight hamstrings in sitting, improved with 2x30 sec stretch with slight change in knee extension ROM. Tender to palpation over medial tibial plateau.    Patient performed sit to/from stand x2 using RW with CGA and significantly increased time to come to full stand due to knee pain. Provided verbal cues for forward weight shift and increased knee/hip/trunk extension. B knees remain flexed in standing and patient demonstrates increased use of upper extremities to stand. Attempted to step x2, patient unable to tolerate stepping with R or L foot this session due to pain and requested to return to sitting. RN still unavailable to provide pain medicine at this time.   Focused remainder of session on pain management strategies, including administration of medications prior to  therapies/increased activity, ice following activity, B lower extremity elevation for edema control, massage, hamstring stretch, and AROM within pain-free range. Reviewed patient's medication for pain and frequency. Discussed use of his phone for setting alarms for medication administration around daily schedule. Discussed history of falls x2, per report both falls related to lower extremity weakness and proprioceptive deficits. Educated patient on fall risk/prevention, home modifications to prevent falls, and activation of emergency services in the event of a fall during session.   Discussed return to work. 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 Stated that he remains sitting throughout the day. Will require use of B lower extremities to operate the machine. Patient asking for exercises and activities to perform at home. Discussed exercises that have been most challenging. Patient reports standing balance holding a beach ball, without upper extremity support, and step-taps on a single step. Discussed how to modify these activities at home and performing with supervision from his wife. Lead therapist made aware.   Patient very attentive to all education and appreciative of limiting activity due to poor pain management this morning. RN located after session and able to bring patient medication.   Patient in recliner at end of session with breaks locked, chair alarm set, and all needs within reach.    Therapy Documentation Precautions:  Precautions Precautions: Fall,Cervical Precaution Booklet Issued:  (discussed precautions) Precaution Comments: Blind Required Braces or Orthoses: Cervical Brace Cervical Brace: Hard collar,Other (comment) (don in sitting, ok to doff in bed) Restrictions Weight Bearing Restrictions: No   Therapy/Group: Individual Therapy  Miliyah Luper L Winferd Wease PT, DPT  12/01/2020, 12:30 PM

## 2020-12-01 NOTE — Progress Notes (Signed)
[facility] PHYSICAL MEDICINE & REHABILITATION PROGRESS NOTE   Subjective/Complaints:  Pt reports no real pain at rest, but gets "tweaked" during therapy-  L knee hurting more with therapy- hasn't taken pain meds yet this AM.  Also having more tingling of feet Hard to take Norco before therapy because is q6 hours- will change to q4h prn- Asked him to pretreat before therapy.  Also, will add Lyrica for nerve pain. Explained could cause side effects- let me know.    Had a medium BM yesterday, but KUB that shows moderate stool was AFTER BM.   ROS:    Pt denies SOB, abd pain, CP, N/V/C/D, and vision changes   Objective:   MR KNEE LEFT WO CONTRAST  Result Date: 11/30/2020 CLINICAL DATA:  Left knee pain after fall 1 month ago EXAM: MRI OF THE LEFT KNEE WITHOUT CONTRAST TECHNIQUE: Multiplanar, multisequence MR imaging of the knee was performed. No intravenous contrast was administered. COMPARISON:  None. FINDINGS: Technical note: Motion degraded examination. MENISCI Medial meniscus: Meniscal body is somewhat diminutive suggesting degeneration. No discernible medial meniscal tear. Lateral meniscus:  Intact. LIGAMENTS Cruciates:  Intact ACL and PCL. Collaterals: Medial collateral ligament is intact. Lateral collateral ligament complex is intact. CARTILAGE Patellofemoral: Partial-thickness chondral surface ulceration of the medial patellar facet. Mild chondral surface irregularity within the trochlea. No full-thickness cartilage defect identified. Medial: Cartilage thinning of the weight-bearing medial compartment most pronounced at the peripheral aspect of the medial tibial plateau. Lateral:  No cartilage defect. Joint: Trace knee joint effusion. 8 mm probable ganglion cyst posterior to the PCL. Mild edema within the suprapatellar fat pad. Hoffa's fat within normal limits. Popliteal Fossa:  No Baker cyst. Intact popliteus tendon. Extensor Mechanism:  Intact quadriceps tendon and patellar tendon.  Bones: Focal marrow edema at the posterior aspect of the lateral tibial plateau without discrete fracture line. Subchondral marrow edema at the periphery of the medial tibial plateau is likely reactive. No malalignment. No suspicious bone lesion. Other: Nonspecific subcutaneous edema. IMPRESSION: 1. Motion degraded exam. 2. Focal marrow edema at the posterior aspect of the lateral tibial plateau without discrete fracture line. Findings may reflect bone contusion. 3. Medial and patellofemoral compartment osteoarthritis. 4. Trace knee joint effusion, nonspecific. 5. Intact menisci. Intact cruciate and collateral ligaments. Electronically Signed   By: Duanne GuessNicholas  Plundo D.O.   On: 11/30/2020 19:17   DG Abd Portable 1V  Result Date: 11/30/2020 CLINICAL DATA:  Generalized abdominal pain, constipation EXAM: PORTABLE ABDOMEN - 1 VIEW COMPARISON:  None. FINDINGS: Supine frontal view of the abdomen and pelvis excludes the hemidiaphragms by collimation. There is moderate stool throughout the colon. No bowel obstruction or ileus. No masses or abnormal calcifications. No acute bony abnormalities. IMPRESSION: 1. Moderate fecal retention.  No evidence of obstruction. Electronically Signed   By: Sharlet SalinaMichael  Brown M.D.   On: 11/30/2020 17:14   No results for input(s): WBC, HGB, HCT, PLT in the last 72 hours. No results for input(s): NA, K, CL, CO2, GLUCOSE, BUN, CREATININE, CALCIUM in the last 72 hours.  Intake/Output Summary (Last 24 hours) at 12/01/2020 1442 Last data filed at 12/01/2020 1300 Gross per 24 hour  Intake 1020 ml  Output 200 ml  Net 820 ml        Physical Exam: Vital Signs Blood pressure 124/72, pulse 60, temperature (!) 97.4 F (36.3 C), temperature source Oral, resp. rate 18, height 6\' 2"  (1.88 m), SpO2 95 %. Gen: sitting up in bedside chair in room, on phone with wife,  NAD HEENT: oral mucosa pink and moist, NCAT. Blind from macular degeneration. 1 tooth sticks out slightly in front-no  change Cardio: RRR Chest: CTA B/L- no W/R/R- good air movement Abd: soft, NT, somewhat distended, hypoactive Ext: no edema Psych: smiling, bright affect- still Skin: intact Neuro: Musculoskeletal:        General: Swelling present.     Comments: Left knee with mild to moderate effusion and skin changes due to resolving ecchymosis? Tender on medial joint line and tibial plateau. Right knee with minimal edema but also TTP on tibial plateau and medial joint line  -more  TTP over L knee/medial joint line again this AM Neurological: Ox3   5/5 strength in upper extremities. 4/5 strength in lower extremities. Sensation is intact.    Assessment/Plan: 1. Functional deficits which require 3+ hours per day of interdisciplinary therapy in a comprehensive inpatient rehab setting.  Physiatrist is providing close team supervision and 24 hour management of active medical problems listed below.  Physiatrist and rehab team continue to assess barriers to discharge/monitor patient progress toward functional and medical goals  Care Tool:  Bathing    Body parts bathed by patient: Right arm,Left arm,Chest,Abdomen,Front perineal area,Buttocks,Right upper leg,Left upper leg,Face,Right lower leg,Left lower leg   Body parts bathed by helper: Right lower leg,Left lower leg     Bathing assist Assist Level: Contact Guard/Touching assist     Upper Body Dressing/Undressing Upper body dressing   What is the patient wearing?: Pull over shirt    Upper body assist Assist Level: Set up assist    Lower Body Dressing/Undressing Lower body dressing      What is the patient wearing?: Underwear/pull up,Pants     Lower body assist Assist for lower body dressing: Contact Guard/Touching assist     Toileting Toileting Toileting Activity did not occur (Clothing management and hygiene only): N/A (no void or bm)  Toileting assist Assist for toileting: Independent with assistive device Assistive Device Comment:  urinal   Transfers Chair/bed transfer  Transfers assist     Chair/bed transfer assist level: Supervision/Verbal cueing Chair/bed transfer assistive device: Licensed conveyancer   Ambulation assist      Assist level: Supervision/Verbal cueing Assistive device: Walker-rolling Max distance: 180   Walk 10 feet activity   Assist     Assist level: Supervision/Verbal cueing Assistive device: Walker-rolling   Walk 50 feet activity   Assist    Assist level: Supervision/Verbal cueing Assistive device: Walker-rolling    Walk 150 feet activity   Assist    Assist level: Supervision/Verbal cueing Assistive device: Walker-rolling    Walk 10 feet on uneven surface  activity   Assist Walk 10 feet on uneven surfaces activity did not occur: Safety/medical concerns         Wheelchair     Assist Will patient use wheelchair at discharge?: No             Wheelchair 50 feet with 2 turns activity    Assist            Wheelchair 150 feet activity     Assist          Blood pressure 124/72, pulse 60, temperature (!) 97.4 F (36.3 C), temperature source Oral, resp. rate 18, height 6\' 2"  (1.88 m), SpO2 95 %.  Medical Problem List and Plan: 1.  Impaired mobility and ADLs secondary to cervical myelopathy s/p ACDF             -patient may  shower but spinal incision must be covered.              -ELOS/Goals: modI in 5-7 days  Initial CIR evaluations today. 2.  Antithrombotics: -DVT/anticoagulation:  Pharmaceutical: Heparin             -antiplatelet therapy: N/A 3. Pain Management: Hydrocodone prn seems to be effective. Will continue robaxin prn for back muscle spasms. Using approximately twice per day. Discussed decreasing frequency to q6H prn and patient is agreeable.  1/18- added voltaren gel for knee pain QID- if doesn't help enough, will need steroid injections for B/L knee pain  1/19- pt said voltaren is working enough-  doesn't want injections right now  1/20- we discussed at length- will do steroid injection of L knee- today- at 10:30am  1/21- injection has been helpful so far- explained cannot get again for 3 months.   1/22- will get L Knee MRI since still "tweaking him" so much- think he has meniscal tear- if does, might need L brace to keep knee straight.   1/23- main limiter for d/c is pain/blindness- no brace since MRI only shows bone contusion, no tears of meniscus or ligament- will increase Norco to q4 hours prn and add Lyrica 50 mg TID for nerve pain.  4. Mood: LCSW to follow for evaluation and support.              -antipsychotic agents: N/A 5. Neuropsych: This patient is capable of making decisions on his own behalf. 6. Skin/Wound Care: Routine pressure relief measures. Continue surgical dressing for now.   1/18- d/c honeycomb dressing and place dry dressing daily.  7. Fluids/Electrolytes/Nutrition: Monitor I/O. Electrolytes stable 1/16  8. HTN: Monitor BP tid. Monitor for orthostatic changes. Currently under excellent control- continue Norvasc and Microzide.  1/23- BP well controlled- con't meds 9. Seizure prophylaxis: Continue keppra 500 mg bid-->should help with neuropathy also.   1/17- also c/o lisp- explained can take up to 2 months or so to go away sometimes.  10. Constipation: Last received senna-docusate 1/12.  1/17- LBM 2 days ago- denies constipation  1/18- LBM at least 3-4 days ago- will order sorbitol for bowels- since nothing documented.   1/19- didn't have any documented BM- will order Mg citrate after therapy.   1/20- refused mg citrate, but took another dose of Sorbitol- had extra large BM- but says 1/2 empty now  1/21- will give Mg citrate per pt request- after therapy today.   1/22- took mg citrate- no results- will check KUB portable.   1/23- had medium BM yesterday before KUB which showed moderate stool throughout colon- will give meds a chance to work- if no more BMs by  tomorrow, would try Sorbitol, etc again.  11. Hypoalbuminemia: encourage high protein foods  12. B/L L>R knee pain  1/17- will order voltaren gel QID for now- doesn't want surgery- since on SQ Heparin, wary to start NSAID for bleeding risk.   1/18- wait today on knee injections- see how topical voltaren helping.  1/19- thinks helping enough- doesn't want injections right now  1/20- will injection L knee today- will also place lidocaine cream at 9:30am to help with pain of injection.   1/21- pain doing much better- injection not as bad as expected.   1/22- will get L knee MRI since think he has meniscal tear.   1/23- will increase Norco to q4 hours prn to take before therapy- no tears seen on MRI- no brace needed since no tears.  LOS: 8 days A FACE TO FACE EVALUATION WAS PERFORMED  Fred Hammes 12/01/2020, 2:42 PM

## 2020-12-01 NOTE — Progress Notes (Signed)
Increased discomfort to left knee after "the way my knee was positioned' when getting MRI. PRN vicodin given at 63. And PRN robaxin given at 2139. Slept good since 2215. No further BM since one on previous shift. Alfredo Martinez A

## 2020-12-02 ENCOUNTER — Inpatient Hospital Stay (HOSPITAL_COMMUNITY): Payer: BC Managed Care – PPO | Admitting: Physical Therapy

## 2020-12-02 ENCOUNTER — Inpatient Hospital Stay (HOSPITAL_COMMUNITY): Payer: BC Managed Care – PPO

## 2020-12-02 DIAGNOSIS — K5901 Slow transit constipation: Secondary | ICD-10-CM

## 2020-12-02 DIAGNOSIS — M792 Neuralgia and neuritis, unspecified: Secondary | ICD-10-CM

## 2020-12-02 LAB — CBC
HCT: 38 % — ABNORMAL LOW (ref 39.0–52.0)
Hemoglobin: 13.1 g/dL (ref 13.0–17.0)
MCH: 30.2 pg (ref 26.0–34.0)
MCHC: 34.5 g/dL (ref 30.0–36.0)
MCV: 87.6 fL (ref 80.0–100.0)
Platelets: 250 10*3/uL (ref 150–400)
RBC: 4.34 MIL/uL (ref 4.22–5.81)
RDW: 12.3 % (ref 11.5–15.5)
WBC: 4 10*3/uL (ref 4.0–10.5)
nRBC: 0 % (ref 0.0–0.2)

## 2020-12-02 LAB — BASIC METABOLIC PANEL
Anion gap: 6 (ref 5–15)
BUN: 17 mg/dL (ref 6–20)
CO2: 30 mmol/L (ref 22–32)
Calcium: 9.7 mg/dL (ref 8.9–10.3)
Chloride: 103 mmol/L (ref 98–111)
Creatinine, Ser: 1.31 mg/dL — ABNORMAL HIGH (ref 0.61–1.24)
GFR, Estimated: >60 mL/min (ref >60)
Glucose, Bld: 96 mg/dL (ref 70–99)
Potassium: 3.9 mmol/L (ref 3.5–5.1)
Sodium: 139 mmol/L (ref 135–145)

## 2020-12-02 MED ORDER — SORBITOL 70 % SOLN
60.0000 mL | Status: AC
  Administered 2020-12-02: 60 mL via ORAL
  Filled 2020-12-02: qty 60

## 2020-12-02 NOTE — Progress Notes (Signed)
[facility] PHYSICAL MEDICINE & REHABILITATION PROGRESS NOTE   Subjective/Complaints:  Pt c/o left knee pain and tingling in legs/feet but overall this pain seems better today. Wearing ice pack on left knee  ROS: Patient denies fever, rash, sore throat, blurred vision, nausea, vomiting, diarrhea, cough, shortness of breath or chest pain, joint or back pain, headache, or mood change.    Objective:   MR KNEE LEFT WO CONTRAST  Result Date: 11/30/2020 CLINICAL DATA:  Left knee pain after fall 1 month ago EXAM: MRI OF THE LEFT KNEE WITHOUT CONTRAST TECHNIQUE: Multiplanar, multisequence MR imaging of the knee was performed. No intravenous contrast was administered. COMPARISON:  None. FINDINGS: Technical note: Motion degraded examination. MENISCI Medial meniscus: Meniscal body is somewhat diminutive suggesting degeneration. No discernible medial meniscal tear. Lateral meniscus:  Intact. LIGAMENTS Cruciates:  Intact ACL and PCL. Collaterals: Medial collateral ligament is intact. Lateral collateral ligament complex is intact. CARTILAGE Patellofemoral: Partial-thickness chondral surface ulceration of the medial patellar facet. Mild chondral surface irregularity within the trochlea. No full-thickness cartilage defect identified. Medial: Cartilage thinning of the weight-bearing medial compartment most pronounced at the peripheral aspect of the medial tibial plateau. Lateral:  No cartilage defect. Joint: Trace knee joint effusion. 8 mm probable ganglion cyst posterior to the PCL. Mild edema within the suprapatellar fat pad. Hoffa's fat within normal limits. Popliteal Fossa:  No Baker cyst. Intact popliteus tendon. Extensor Mechanism:  Intact quadriceps tendon and patellar tendon. Bones: Focal marrow edema at the posterior aspect of the lateral tibial plateau without discrete fracture line. Subchondral marrow edema at the periphery of the medial tibial plateau is likely reactive. No malalignment. No suspicious bone  lesion. Other: Nonspecific subcutaneous edema. IMPRESSION: 1. Motion degraded exam. 2. Focal marrow edema at the posterior aspect of the lateral tibial plateau without discrete fracture line. Findings may reflect bone contusion. 3. Medial and patellofemoral compartment osteoarthritis. 4. Trace knee joint effusion, nonspecific. 5. Intact menisci. Intact cruciate and collateral ligaments. Electronically Signed   By: Duanne Guess D.O.   On: 11/30/2020 19:17   DG Abd Portable 1V  Result Date: 11/30/2020 CLINICAL DATA:  Generalized abdominal pain, constipation EXAM: PORTABLE ABDOMEN - 1 VIEW COMPARISON:  None. FINDINGS: Supine frontal view of the abdomen and pelvis excludes the hemidiaphragms by collimation. There is moderate stool throughout the colon. No bowel obstruction or ileus. No masses or abnormal calcifications. No acute bony abnormalities. IMPRESSION: 1. Moderate fecal retention.  No evidence of obstruction. Electronically Signed   By: Sharlet Salina M.D.   On: 11/30/2020 17:14   Recent Labs    12/02/20 0522  WBC 4.0  HGB 13.1  HCT 38.0*  PLT 250   Recent Labs    12/02/20 0522  NA 139  K 3.9  CL 103  CO2 30  GLUCOSE 96  BUN 17  CREATININE 1.31*  CALCIUM 9.7    Intake/Output Summary (Last 24 hours) at 12/02/2020 1218 Last data filed at 12/02/2020 0800 Gross per 24 hour  Intake 1080 ml  Output 1100 ml  Net -20 ml        Physical Exam: Vital Signs Blood pressure 140/90, pulse 62, temperature 97.6 F (36.4 C), resp. rate 16, height 6\' 2"  (1.88 m), SpO2 95 %. Constitutional: No distress . Vital signs reviewed. HEENT: EOMI, oral membranes moist Neck:aspen collar Cardiovascular: RRR without murmur. No JVD    Respiratory/Chest: CTA Bilaterally without wheezes or rales. Normal effort    GI/Abdomen: BS +, non-tender, non-distended Ext: no clubbing, cyanosis,  or edema Psych: pleasant and cooperative Skin: intact Neuro: Musculoskeletal:        General: Swelling present.      Comments: Left knee somewhat TTP along medial jt line, some swelling. right knee lesser so. Neurological: Ox3. blind   5/5 strength in upper extremities. 4/5 strength in lower extremities. Sensation is intact.    Assessment/Plan: 1. Functional deficits which require 3+ hours per day of interdisciplinary therapy in a comprehensive inpatient rehab setting.  Physiatrist is providing close team supervision and 24 hour management of active medical problems listed below.  Physiatrist and rehab team continue to assess barriers to discharge/monitor patient progress toward functional and medical goals  Care Tool:  Bathing    Body parts bathed by patient: Right arm,Left arm,Chest,Abdomen,Front perineal area,Buttocks,Right upper leg,Left upper leg,Face,Right lower leg,Left lower leg   Body parts bathed by helper: Right lower leg,Left lower leg     Bathing assist Assist Level: Contact Guard/Touching assist     Upper Body Dressing/Undressing Upper body dressing   What is the patient wearing?: Pull over shirt    Upper body assist Assist Level: Set up assist    Lower Body Dressing/Undressing Lower body dressing      What is the patient wearing?: Underwear/pull up,Pants     Lower body assist Assist for lower body dressing: Supervision/Verbal cueing     Toileting Toileting Toileting Activity did not occur (Clothing management and hygiene only): N/A (no void or bm)  Toileting assist Assist for toileting: Independent with assistive device Assistive Device Comment: urinal   Transfers Chair/bed transfer  Transfers assist     Chair/bed transfer assist level: Supervision/Verbal cueing Chair/bed transfer assistive device: Licensed conveyancer   Ambulation assist      Assist level: Supervision/Verbal cueing Assistive device: Walker-rolling Max distance: 180   Walk 10 feet activity   Assist     Assist level: Supervision/Verbal cueing Assistive  device: Walker-rolling   Walk 50 feet activity   Assist    Assist level: Supervision/Verbal cueing Assistive device: Walker-rolling    Walk 150 feet activity   Assist    Assist level: Supervision/Verbal cueing Assistive device: Walker-rolling    Walk 10 feet on uneven surface  activity   Assist Walk 10 feet on uneven surfaces activity did not occur: Safety/medical concerns         Wheelchair     Assist Will patient use wheelchair at discharge?: No             Wheelchair 50 feet with 2 turns activity    Assist            Wheelchair 150 feet activity     Assist          Blood pressure 140/90, pulse 62, temperature 97.6 F (36.4 C), resp. rate 16, height 6\' 2"  (1.88 m), SpO2 95 %.  Medical Problem List and Plan: 1.  Impaired mobility and ADLs secondary to cervical myelopathy s/p ACDF             -patient may shower but spinal incision must be covered.              -ELOS/Goals: modI in 5-7 days  -Continue CIR therapies including PT, OT  2.  Antithrombotics: -DVT/anticoagulation:  Pharmaceutical: Heparin             -antiplatelet therapy: N/A 3. Pain Management: Hydrocodone prn seems to be effective. Will continue robaxin prn for back muscle spasms. Using approximately twice per day.  Discussed decreasing frequency to q6H prn and patient is agreeable.  1/18- added voltaren gel for knee pain QID- if doesn't help enough, will need steroid injections for B/L knee pain  1/19- pt said voltaren is working enough- doesn't want injections right now  1/20- we discussed at length- will do steroid injection of L knee- today- at 10:30am  1/21- injection has been helpful so far- explained cannot get again for 3 months.   1/22- will get L Knee MRI since still "tweaking him" so much- think he has meniscal tear- if does, might need L brace to keep knee straight.   1/23- main limiter for d/c is pain/blindness- no brace since MRI only shows bone contusion, no  tears of meniscus or ligament- will increase Norco to q4 hours prn and add Lyrica 50 mg TID for nerve pain.   1/24 pain seems better today per pt. 4. Mood: LCSW to follow for evaluation and support.              -antipsychotic agents: N/A 5. Neuropsych: This patient is capable of making decisions on his own behalf. 6. Skin/Wound Care: Routine pressure relief measures. Continue surgical dressing for now.   1/18- d/c honeycomb dressing and place dry dressing daily.  7. Fluids/Electrolytes/Nutrition: Monitor I/O. Electrolytes stable 1/16  8. HTN: Monitor BP tid. Monitor for orthostatic changes. Currently under excellent control- continue Norvasc and Microzide.  1/23-24- BP well controlled- con't meds 9. Seizure prophylaxis: Continue keppra 500 mg bid-->should help with neuropathy also.   1/17- also c/o lisp- explained can take up to 2 months or so to go away sometimes.  10. Constipation: Last received senna-docusate 1/12.  1/17- LBM 2 days ago- denies constipation  1/18- LBM at least 3-4 days ago- will order sorbitol for bowels- since nothing documented.   1/19- didn't have any documented BM- will order Mg citrate after therapy.   1/20- refused mg citrate, but took another dose of Sorbitol- had extra large BM- but says 1/2 empty now  1/21- will give Mg citrate per pt request- after therapy today.   1/22- took mg citrate- no results- will check KUB portable.   1/23- had medium BM yesterday before KUB which showed moderate stool throughout colon- will give meds a chance to work- if no more BMs by tomorrow, would try Sorbitol, etc again.   1/24 no bm since medium one 1/22--will give sorbitol today per above plan, sse if no results with sorbitol 11. Hypoalbuminemia: encourage high protein foods  12. B/L L>R knee pain  1/17- will order voltaren gel QID for now- doesn't want surgery- since on SQ Heparin, wary to start NSAID for bleeding risk.   1/18- wait today on knee injections- see how topical  voltaren helping.  1/19- thinks helping enough- doesn't want injections right now  1/20- will injection L knee today- will also place lidocaine cream at 9:30am to help with pain of injection.   1/21- pain doing much better- injection not as bad as expected.   1/22- will get L knee MRI since think he has meniscal tear.   1/23- will increase Norco to q4 hours prn to take before therapy- no tears seen on MRI- no brace needed since no tears.   1/24 see prob #3      LOS: 9 days A FACE TO FACE EVALUATION WAS PERFORMED  Ranelle Oyster 12/02/2020, 12:18 PM

## 2020-12-02 NOTE — Progress Notes (Signed)
   Providing Compassionate, Quality Care - Together  NEUROSURGERY PROGRESS NOTE   S: pt s/e in CIR  O: EXAM:  BP 119/73 (BP Location: Left Arm)   Pulse 68   Temp (!) 97.5 F (36.4 C)   Resp 18   Ht 6\' 2"  (1.88 m)   SpO2 98%   BMI 25.42 kg/m   Awake, alert, oriented  Speech fluent, appropriate  Incision well healed, staples present 5/5 BUE 4+/5 BLE   ASSESSMENT:  55 y.o. male with  1. Cervical myelopathy, C5-7  S/p ACDF C5-7 on 11/19/2020  PLAN: - staples removed -continue cir -f/u 6 weeks -overall doing well    Thank you for allowing me to participate in this patient's care.  Please do not hesitate to call with questions or concerns.   01/17/2021, DO Neurosurgeon [facility] Cell: [phone]

## 2020-12-02 NOTE — Progress Notes (Signed)
Physical Therapy Session Note  Patient Details  Name: Wayne Mckinney MRN: [id number] Date of Birth: 06/21/1966  Today's Date: 12/02/2020 PT Individual Time:1100,1210 1415-1510 PT Individual Time Calculation (min): 70 min, 55 min  PT missed time: 20 min Missed time reason: Pt illness (hypotension)  Short Term Goals: Week 1:  PT Short Term Goal 1 (Week 1): =LTG due to ELOS  Skilled Therapeutic Interventions/Progress Updates:   AM session: Pt seated in reclined on arrival and agreeable to therapy. States he has had pain medication and topical gel applied to his knee prior to session. STS transfers supervision with RW throughout session. Pt ambulated to gym supervision level with RW, 150 ft. Pt demonstrates trunk and knee flexion during gait, with low foot clearance. Demonstrates improvements in BOS. Session focused on setting up HEP via medbridge program and allowing pt to hear verbal instructions in [employer]. Pt received HEP and was able to navigate to videos within app.  HEP as follows:  Access Code: [email or web address] URL: [email or web address] Date: 12/02/2020 Prepared by: Peter Congo  Exercises Supine Bridge - 1 x daily - 7 x weekly - 3 sets - 10 reps Sidelying Hip Abduction - 1 x daily - 7 x weekly - 3 sets - 10 reps Supine Active Straight Leg Raise - 1 x daily - 7 x weekly - 3 sets - 10 reps Standing 3-way Hip with Walker - 1 x daily - 7 x weekly - 3 sets - 10 reps Sit to Stand with Counter Support - 1 x daily - 7 x weekly - 3 sets - 10 reps Squat with Counter Support - 1 x daily - 7 x weekly - 3 sets - 10 reps Step Up - 1 x daily - 7 x weekly - 3 sets - 10 reps Clamshell with Resistance - 1 x daily - 7 x weekly - 3 sets - 10 reps   PM session: Pt resting in recliner on arrival, states he is ready to go. STS transfers supervision throughout session. Gait x 150 ft supervision with RW. Pt performed 8 stairs x 2 with standing rest break, using lateral technique with  L hand rail. Pt required CGA at times but was supervision overall. Pt performed one set of step ups x 10. Once seated, stated that he felt "like I did before I had the seizure," reporting nausea and light headedness. BP=84/62, HR=46. Pt returned to room and returned to bed. Once supine, states he felt better. Supine BP=136/81, HR=53. Nursing notified of pt symptoms. Pt left with all needs in reach.   Therapy Documentation Precautions:  Precautions Precautions: Fall,Cervical Precaution Booklet Issued:  (discussed precautions) Precaution Comments: Blind Required Braces or Orthoses: Cervical Brace Cervical Brace: Hard collar,Other (comment) (don in sitting, ok to doff in bed) Restrictions Weight Bearing Restrictions: No       Therapy/Group: Individual Therapy  Dyane Dustman, SPT 12/02/2020, 5:06 PM

## 2020-12-02 NOTE — Progress Notes (Signed)
Slept good with added lyrica. PRN robaxin given this am at 0604 and vicodin at 0654. Wayne Mckinney

## 2020-12-02 NOTE — Progress Notes (Signed)
Inpatient Rehabilitation Care Coordinator Discharge Note  The overall goal for the admission was met for:   Discharge location: Yes. D/c to home with support from wife.   Length of Stay: Yes. 10 days.   Discharge activity level: Yes. Supervision.   Home/community participation: Yes. Limited.   Services provided included: MD, RD, PT, OT, RN, CM, TR, Pharmacy, Neuropsych and SW  Financial Services: Private Insurance: [employer] offered to/list presented [email or web address] HHA list  Follow-up services arranged: Home Health: [facility] for HHPT/OT and DME: N/A Pt to purchase TTB.   Comments (or additional information): contact pt [phone] or pt wife Olivia Mackie [phone]  Patient/Family verbalized understanding of follow-up arrangements: Yes  Individual responsible for coordination of the follow-up plan: Pt to have assistance with coordinating care needs.   Confirmed correct DME delivered: Rana Snare 12/02/2020    Rana Snare

## 2020-12-02 NOTE — Progress Notes (Signed)
Occupational Therapy Session Note  Patient Details  Name: Wayne Mckinney MRN: [id number] Date of Birth: 1966-04-19  Today's Date: 12/02/2020 OT Individual Time: 0900-1000 OT Individual Time Calculation (min): 60 min    Short Term Goals: Week 1:  OT Short Term Goal 1 (Week 1): LTGs=STGs due to ELOS  Skilled Therapeutic Interventions/Progress Updates:    OT intervention with focus on bathing/dressing with sit<>stand from w/c at sink, functional transfers, standing balance, safety awareness, and discharge planning. Functional transfers and amb with RW at supervsion level with verbal cues for navigation. Bathing/dressing with supervision using sock aide for donning socks. Pt requires more then a reasonable amount of time completing tasks in an unfamiliar envirionment/setting. Standing balance with supervision. Pt transferred back to recliner with supervsion. Pt remained in recliner with all needs within reach.   Therapy Documentation Precautions:  Precautions Precautions: Fall,Cervical Precaution Booklet Issued:  (discussed precautions) Precaution Comments: Blind Required Braces or Orthoses: Cervical Brace Cervical Brace: Hard collar,Other (comment) (don in sitting, ok to doff in bed) Restrictions Weight Bearing Restrictions: No Pain:  Pt states ice is helping his Lt knee pain; ice reapplied at end of session   Therapy/Group: Individual Therapy  Rich Brave 12/02/2020, 10:05 AM

## 2020-12-03 ENCOUNTER — Encounter (HOSPITAL_COMMUNITY): Payer: BC Managed Care – PPO | Admitting: Psychology

## 2020-12-03 ENCOUNTER — Inpatient Hospital Stay (HOSPITAL_COMMUNITY): Payer: BC Managed Care – PPO | Admitting: Physical Therapy

## 2020-12-03 ENCOUNTER — Inpatient Hospital Stay (HOSPITAL_COMMUNITY): Payer: BC Managed Care – PPO

## 2020-12-03 MED ORDER — MIDODRINE HCL 5 MG PO TABS
5.0000 mg | ORAL_TABLET | Freq: Every day | ORAL | Status: DC | PRN
Start: 2020-12-03 — End: 2020-12-04

## 2020-12-03 MED ORDER — COVID-19 MRNA VAC-TRIS(PFIZER) 30 MCG/0.3ML IM SUSP
0.3000 mL | Freq: Once | INTRAMUSCULAR | Status: AC
  Administered 2020-12-03: 0.3 mL via INTRAMUSCULAR
  Filled 2020-12-03: qty 0.3

## 2020-12-03 NOTE — Patient Care Conference (Signed)
Inpatient RehabilitationTeam Conference and Plan of Care Update Date: 12/03/2020   Time: 11:01 AM    Patient Name: Wayne Mckinney      Medical Record Number: [id number]  Date of Birth: 04-Sep-1966 Sex: Male         Room/Bed: 4W11C/4W11C-01 Payor Info: Payor: BLUE CROSS BLUE SHIELD / Plan: BCBS COMM PPO / Product Type: *No Product type* /    Admit Date/Time:  11/23/2020  1:35 PM  Primary Diagnosis:  Cervical myelopathy [facility])  Hospital Problems: Principal Problem:   Cervical myelopathy [facility])    Expected Discharge Date: Expected Discharge Date: 12/04/20  Team Members Present: Physician leading conference: Dr. Genice Rouge Care Coodinator Present: Cecile Sheerer, LCSWA;Rayanne Padmanabhan Marlyne Beards, RN, BSN, CRRN Nurse Present: Despina Hidden, RN PT Present: Peter Congo, PT;Other (comment) Dyane Dustman, Student PT) OT Present: Ardis Rowan, COTA;Jennifer Katrinka Blazing, OT PPS Coordinator present : Edson Snowball, Park Breed, SLP     Current Status/Progress Goal Weekly Team Focus  Bowel/Bladder   continent of B&B, constipation mag citrate given over weekend and KUB. Sorbitol given 01/24 with good results.  Patient will remain continent with normal B/B patern  Regular BM's ? add senna s   Swallow/Nutrition/ Hydration             ADL's   bathing/dressing-supervision; functional transers-supervision; toileting-supervision/CGA  supervision overall  discharge planning, educaiton, safety awareness   Mobility   mod I bed mobility, supervision transfers with RW, supervision gait up to 200 ft with RW, supervision stairs  Supervision overall  endurance, balance, LE strength, LE NMR   Communication             Safety/Cognition/ Behavioral Observations            Pain   increased C/O pain to left knee. MRI over weekend. scheduled lyrica added. Ice PRN. taking PRN vicodin and robaxin  Assess patient for pain on every shift and PRN  encourage patient to take PRN vicodin and/or robaxin more frequently to  better manage pain.   Skin   surgeon removed staples to anterior neck incision 01/24. no signs of infection  Patient skin will remain intact with no infection  monitor neck incision for infection     Discharge Planning:  Pt to d/c to home with his wife; she is legally blind but works FT and will be taking FMLA. Pt set up with [facility] with [facility] for HHPT/OT.   Team Discussion: Goes home tomorrow, BP low yesterday 80's/50's, Cr up to 1.31, needs to drink fluids, requesting Pfizer COVID booster. Continent B/B, given Sorbitol to produce bowel movement. Has complaints of pain, appropriate medications given. Wife still needing letter from MD. Not going home on Lovenox. Patient on target to meet rehab goals: Ambulating to and from the gym, goal is supervision. He is at goal level with ADL's. He has been blind most of his life so he knows how to navigate and make appropriate accommodations.  *See Care Plan and progress notes for long and short-term goals.   Revisions to Treatment Plan:  MD added Lyrica over the weekend, seemed to help.  Teaching Needs: Family education, medication management, wound/skin care, safety awareness, balance training, gait training, transfer training  Current Barriers to Discharge: Inaccessible home environment, Decreased caregiver support, Home enviroment access/layout, Wound care, Lack of/limited family support and Behavior  Possible Resolutions to Barriers: Continue current medications, provide emotional support to patient and family.     Medical Summary Current Status: Low BP- 80s/50s- Cr up  to 1.31- needs to drink; will order Pfizer COVID booster. BM with sorbitol  Barriers to Discharge: Home enviroment access/layout;Medical stability;Weight bearing restrictions;Wound care;Decreased family/caregiver support  Barriers to Discharge Comments: staples out-trying to arrange d/c- way to get home Possible Resolutions to [employer] Focus: will start  midodrine prn for low BP; constipation- needs miralax;   d/c Wednesday 12/04/20- supervision transfers gait-goals supervision-   Continued Need for Acute Rehabilitation Level of Care: The patient requires daily medical management by a physician with specialized training in physical medicine and rehabilitation for the following reasons: Direction of a multidisciplinary physical rehabilitation program to maximize functional independence : Yes Medical management of patient stability for increased activity during participation in an intensive rehabilitation regime.: Yes Analysis of laboratory values and/or radiology reports with any subsequent need for medication adjustment and/or medical intervention. : Yes   I attest that I was present, lead the team conference, and concur with the assessment and plan of the team.   Tennis Must 12/03/2020, 4:42 PM

## 2020-12-03 NOTE — Progress Notes (Signed)
Occupational Therapy Discharge Summary  Patient Details  Name: Ramir Malerba MRN: [id number] Date of Birth: [birth year]  Patient has met 9 of 9 long term goals due to improved activity tolerance, improved balance, postural control, ability to compensate for deficits and improved coordination.  Pt made excellent progress with BADLs and functional transfers during this admission. Pt completes bathing/dressing with supervision. All functional tranfsers with RW at supervision level and verbal cues for navigational purposes. Toileting with supervision. Pt has adapted well in unfamiliar environment and employs previously learned compensatory streategies and techniques. Pt pleased with progress and ready for discharge home.Pt's wife (legally blind) has not been present for therapy. Patient to discharge at overall Supervision level.  Patient's care partner is independent to provide the necessary physical assistance at discharge.      Recommendation:  Patient will benefit from ongoing skilled OT services in home health setting to continue to advance functional skills in the area of BADL and iADL.  Equipment: Tub transfer bench  Reasons for discharge: treatment goals met  Patient/family agrees with progress made and goals achieved: Yes  OT Discharge Vision Baseline Vision/History: Legally blind Patient Visual Report: No change from baseline Additional Comments: Blind since 2007; does not have any sight Perception  Perception: Within Functional Limits Praxis Praxis: Intact Cognition Overall Cognitive Status: Within Functional Limits for tasks assessed Arousal/Alertness: Awake/alert Orientation Level: Oriented X4 Attention: Sustained Sustained Attention: Appears intact Memory: Appears intact Awareness: Appears intact Problem Solving: Appears intact Safety/Judgment: Appears intact Sensation Sensation Light Touch: Appears Intact Hot/Cold: Appears Intact Proprioception: Appears  Intact Coordination Gross Motor Movements are Fluid and Coordinated: Yes Fine Motor Movements are Fluid and Coordinated: Yes Motor  Motor Motor: Abnormal postural alignment and control Motor - Skilled Clinical Observations: impaired 2/2 pain and LE weakness Trunk/Postural Assessment  Cervical Assessment Cervical Assessment:  (cervical precautions/collar) Thoracic Assessment Thoracic Assessment: Within Functional Limits Lumbar Assessment Lumbar Assessment: Within Functional Limits  Balance Static Sitting Balance Static Sitting - Balance Support: Feet supported Static Sitting - Level of Assistance: 6: Modified independent (Device/Increase time) Dynamic Sitting Balance Dynamic Sitting - Balance Support: During functional activity Dynamic Sitting - Level of Assistance: 6: Modified independent (Device/Increase time) Extremity/Trunk Assessment RUE Assessment RUE Assessment: Within Functional Limits LUE Assessment LUE Assessment: Within Functional Limits   Leroy Libman 12/03/2020, 6:57 AM

## 2020-12-03 NOTE — Progress Notes (Signed)
Physical Therapy Session Note  Patient Details  Name: Wayne Mckinney MRN: [id number] Date of Birth: Jul 21, 1966  Today's Date: 12/03/2020 PT Individual Time: 0800-0905, 1130-1200, 1440-1555 PT Individual Time Calculation (min): 65 min, 30 min, 75 min    Short Term Goals: Week 1:  PT Short Term Goal 1 (Week 1): =LTG due to ELOS     Skilled Therapeutic Interventions/Progress Updates:   AM session 1: Pt received in recliner and agreeable to therapy. BP in sitting = 138/85, HR=67. STS transfers supervision with RW. Gait 2 x 150 ft, supervision with RW. Pt states his knee is feeling better today. Pt performs STS x 10 with RW and supervision, with less difficulty than previous sessions. Pt issued reacher and educated on using it to pick up items from the floor in standing. Discussed strategies for locating items and using the reacher, practiced with clothespin and washcloth x 2. Discussed kitchen set up, pt states no daily use items are overhead. Pt was able to safely navigate ramp with RW and verbal cues for navigation. Car transfer x 1 with RW supervision with cues for navigating through door. Pt returned to room and finished session seated in recliner with all needs in reach.   AM session 2: Pt received in recliner. STS transfers supervision with RW. Gait 2 x 150 ft, supervision with RW. Assessed strength and sensation, see discharge note. Pt ascended and descended stairs laterally  x 16 with supervision and 1 hand rail. Pt demonstrates less fatigue with stairs today than previous sessions. Pt returned to room and was left seated in recliner with all needs in reach.   PM session: Pt seated in recliner on arrival and is agreeable to therapy. STS transfers supervision level with RW. Gait 3 x 150 ft with supervision and RW. Pt performs squats with RW and supervision with seated rest breaks. R step up with L step to second stair, with RW and supervision. Side steps along mat table with supervison and RW.  Reviewed cervical precautions and practiced donning and doffing cervical collar during rest breaks. Pt used nu step for 10 minutes on level 6, maintaining 60-70 spm. Pt returned to room and was left in recliner with all needs met.   Therapy Documentation Precautions:  Precautions Precautions: Fall,Cervical Precaution Booklet Issued:  (discussed precautions) Precaution Comments: Blind Required Braces or Orthoses: Cervical Brace Cervical Brace: Hard collar,Other (comment) (don in sitting, ok to doff in bed) Restrictions Weight Bearing Restrictions: No    Therapy/Group: Individual Therapy  Sharen Counter, SPT 12/03/2020, 11:22 AM

## 2020-12-03 NOTE — Progress Notes (Signed)
Physical Therapy Discharge Summary  Patient Details  Name: Wayne Mckinney MRN: [id number] Date of Birth: 06/18/1966  Today's Date: 12/03/2020  Patient has met 7 of 7 long term goals due to improved activity tolerance, improved balance, improved postural control, increased range of motion, decreased pain and ability to compensate for deficits.  Patient to discharge at an ambulatory level Supervision.   Patient's care partner is independent to provide the necessary physical assistance at discharge. Pt's wife was providing Supervision assist prior to d/c and safe to continue to assist pt upon d/c home.  Reasons goals not met: Patient has met all goals.  Recommendation:  Patient will benefit from ongoing skilled PT services in home health setting to continue to advance safe functional mobility, address ongoing impairments in endurance, strength, ROM, safety, balance, independence with functional mobility, and minimize fall risk.  Equipment: No equipment provided. Pt already owns all necessary equipment.  Reasons for discharge: treatment goals met and discharge from hospital  Patient/family agrees with progress made and goals achieved: Yes  PT Discharge Precautions/Restrictions Precautions Precautions: Fall;Cervical Precaution Comments: reviewed cervical precautions Required Braces or Orthoses: Cervical Brace Cervical Brace: Hard collar (OOB) Restrictions Weight Bearing Restrictions: No Vision/Perception  Vision - History Baseline Vision: Legally blind Perception Perception: Within Functional Limits Praxis Praxis: Intact  Cognition Overall Cognitive Status: Within Functional Limits for tasks assessed Arousal/Alertness: Awake/alert Orientation Level: Oriented X4 Attention: Sustained Focused Attention: Appears intact Sustained Attention: Appears intact Memory: Appears intact Awareness: Appears intact Problem Solving: Appears intact Safety/Judgment: Appears  intact Sensation Sensation Light Touch: Impaired Detail Light Touch Impaired Details: Impaired RLE;Impaired LLE (distal>proximal) Proprioception: Impaired Detail Proprioception Impaired Details: Impaired RLE;Impaired LLE Coordination Gross Motor Movements are Fluid and Coordinated: No Fine Motor Movements are Fluid and Coordinated: Yes Coordination and Movement Description: impaired due to pain and visual impairments Motor  Motor Motor: Abnormal postural alignment and control Motor - Skilled Clinical Observations: impaired 2/2 pain and LE weakness Motor - Discharge Observations: impaired 2/2 pain  Mobility Bed Mobility Bed Mobility: Rolling Right;Rolling Left;Supine to Sit;Sit to Supine Rolling Right: Independent with assistive device Rolling Left: Independent with assistive device Supine to Sit: Independent with assistive device Sit to Supine: Independent with assistive device Transfers Transfers: Sit to Stand;Stand Pivot Transfers Sit to Stand: Supervision/Verbal cueing Stand Pivot Transfers: Supervision/Verbal cueing Stand Pivot Transfer Details: Tactile cues for placement;Verbal cues for sequencing;Verbal cues for precautions/safety Transfer (Assistive device): Rolling walker Locomotion  Gait Ambulation: Yes Gait Assistance: Supervision/Verbal cueing Gait Distance (Feet): 150 Feet Assistive device: Rolling walker Gait Assistance Details: Verbal cues for precautions/safety Gait Gait: Yes Gait Pattern: Impaired Gait Pattern: Decreased hip/knee flexion - right;Decreased hip/knee flexion - left;Right flexed knee in stance;Left flexed knee in stance;Shuffle;Trunk flexed;Antalgic;Narrow base of support;Poor foot clearance - left;Poor foot clearance - right Gait velocity: decreased Stairs / Additional Locomotion Stairs: Yes Stairs Assistance: Supervision/Verbal cueing Stair Management Technique: One rail Left;Sideways;Step to pattern Number of Stairs: 16 Height of Stairs:  6 Ramp: Supervision/Verbal cueing Wheelchair Mobility Wheelchair Mobility: No  Trunk/Postural Assessment  Cervical Assessment Cervical Assessment: Exceptions to [facility] (cervical precautions; hard collar OOB) Thoracic Assessment Thoracic Assessment: Within Functional Limits Lumbar Assessment Lumbar Assessment: Within Functional Limits Postural Control Postural Control: Deficits on evaluation (impaired)  Balance Balance Balance Assessed: Yes Static Sitting Balance Static Sitting - Balance Support: Feet supported Static Sitting - Level of Assistance: 6: Modified independent (Device/Increase time) Dynamic Sitting Balance Dynamic Sitting - Balance Support: During functional activity Dynamic Sitting - Level of Assistance: 6: Modified  independent (Device/Increase time) Static Standing Balance Static Standing - Balance Support: During functional activity;Left upper extremity supported Static Standing - Level of Assistance: 5: Stand by assistance Dynamic Standing Balance Dynamic Standing - Balance Support: Bilateral upper extremity supported;During functional activity Dynamic Standing - Level of Assistance: 5: Stand by assistance Extremity Assessment   RLE Assessment RLE Assessment: Within Functional Limits Passive Range of Motion (PROM) Comments: tight HS RLE Strength Right Hip Flexion: 5/5 Right Knee Flexion: 5/5 Right Knee Extension: 5/5 Right Ankle Dorsiflexion: 5/5 Right Ankle Plantar Flexion: 5/5 LLE Assessment LLE Assessment: Exceptions to [facility] Passive Range of Motion (PROM) Comments: tight HS General Strength Comments: impaired, see below LLE Strength Left Hip Flexion: 5/5 Left Knee Flexion: 5/5 Left Knee Extension: 4/5 Left Ankle Dorsiflexion: 3+/5 Left Ankle Plantar Flexion: 4+/5      Excell Seltzer, PT, DPT 12/03/2020, 5:52 PM

## 2020-12-03 NOTE — Progress Notes (Addendum)
[place] PHYSICAL MEDICINE & REHABILITATION PROGRESS NOTE   Subjective/Complaints:  Pt reports that L knee pain the same to slightly better- explained to him and called wife on his phone and also explained, pain meds first Rx for 7 days, then call back for 30 day Rx.   Also- pt had BP of 82/50s yesterday per PT during therapy- checked because very lightheaded/dizzy- will order Midodrine 5 mg daily prn for low BP/does too much- if dizzy at home/here- and SBP <100, needs to take 1 dose of Midodrine. Ordered that way- told pt and wife.   Cr up to 1.31- which is high for him- no new meds that would affect that - will ask pt to drink more fluids- also could contribute to lightheadedness.   Will not send home on Lovenox since isn't "True SCI"- has cervical myelopathy, but walking 150 ft at a time or more and has no neurogenic B/B.   Will have Rx's sent to [facility] per pt request.   ROS:  Pt denies SOB, abd pain, CP, N/V/C/D, and vision changes   Objective:   No results found. Recent Labs    12/02/20 0522  WBC 4.0  HGB 13.1  HCT 38.0*  PLT 250   Recent Labs    12/02/20 0522  NA 139  K 3.9  CL 103  CO2 30  GLUCOSE 96  BUN 17  CREATININE 1.31*  CALCIUM 9.7    Intake/Output Summary (Last 24 hours) at 12/03/2020 1007 Last data filed at 12/03/2020 0839 Gross per 24 hour  Intake 1078 ml  Output 1800 ml  Net -722 ml        Physical Exam: Vital Signs Blood pressure 120/79, pulse (!) 56, temperature 98.5 F (36.9 C), resp. rate 18, height 6\' 2"  (1.88 m), SpO2 98 %. Constitutional: No distress . Vital signs reviewed. Sitting up in bedside chair, PT in room, NAD HEENT: EOMI, oral membranes moist Neck:aspen collar and staples out- healing well Cardiovascular: bradycardic- regular rhythm  Respiratory/Chest: CTA B/L- no W/R/R- good air movement  GI/Abdomen: Soft, NT, ND, (+)BS  Ext: no clubbing, cyanosis, or edema Psych: pleasant and cooperative- appropriate Skin:  intact except neck incision Neuro: Musculoskeletal:        General: Swelling present.     Comments: Left knee somewhat TTP along medial jt line, some swelling. right knee lesser so. Neurological: Ox3. blind   5/5 strength in upper extremities. 4/5 strength in lower extremities. Sensation is intact.    Assessment/Plan: 1. Functional deficits which require 3+ hours per day of interdisciplinary therapy in a comprehensive inpatient rehab setting.  Physiatrist is providing close team supervision and 24 hour management of active medical problems listed below.  Physiatrist and rehab team continue to assess barriers to discharge/monitor patient progress toward functional and medical goals  Care Tool:  Bathing    Body parts bathed by patient: Right arm,Left arm,Chest,Abdomen,Front perineal area,Buttocks,Right upper leg,Left upper leg,Face,Right lower leg,Left lower leg   Body parts bathed by helper: Right lower leg,Left lower leg     Bathing assist Assist Level: Contact Guard/Touching assist     Upper Body Dressing/Undressing Upper body dressing   What is the patient wearing?: Pull over shirt    Upper body assist Assist Level: Set up assist    Lower Body Dressing/Undressing Lower body dressing      What is the patient wearing?: Underwear/pull up,Pants     Lower body assist Assist for lower body dressing: Supervision/Verbal cueing  Toileting Toileting Toileting Activity did not occur Press photographer and hygiene only): N/A (no void or bm)  Toileting assist Assist for toileting: Independent with assistive device Assistive Device Comment: urinal   Transfers Chair/bed transfer  Transfers assist     Chair/bed transfer assist level: Supervision/Verbal cueing Chair/bed transfer assistive device: Licensed conveyancer   Ambulation assist      Assist level: Supervision/Verbal cueing Assistive device: Walker-rolling Max distance: 180    Walk 10 feet activity   Assist     Assist level: Supervision/Verbal cueing Assistive device: Walker-rolling   Walk 50 feet activity   Assist    Assist level: Supervision/Verbal cueing Assistive device: Walker-rolling    Walk 150 feet activity   Assist    Assist level: Supervision/Verbal cueing Assistive device: Walker-rolling    Walk 10 feet on uneven surface  activity   Assist Walk 10 feet on uneven surfaces activity did not occur: Safety/medical concerns         Wheelchair     Assist Will patient use wheelchair at discharge?: No             Wheelchair 50 feet with 2 turns activity    Assist            Wheelchair 150 feet activity     Assist          Blood pressure 120/79, pulse (!) 56, temperature 98.5 F (36.9 C), resp. rate 18, height 6\' 2"  (1.88 m), SpO2 98 %.  Medical Problem List and Plan: 1.  Impaired mobility and ADLs secondary to cervical myelopathy s/p ACDF             -patient may shower but spinal incision must be covered.              -ELOS/Goals: modI in 5-7 days  -Continue CIR therapies including PT, OT  2.  Antithrombotics: -DVT/anticoagulation:  Pharmaceutical: Heparin 1/25- will not send home on Heparin since walking 150 ft at a time             -antiplatelet therapy: N/A 3. Pain Management: Hydrocodone prn seems to be effective. Will continue robaxin prn for back muscle spasms. Using approximately twice per day. Discussed decreasing frequency to q6H prn and patient is agreeable.  1/18- added voltaren gel for knee pain QID- if doesn't help enough, will need steroid injections for B/L knee pain  1/19- pt said voltaren is working enough- doesn't want injections right now  1/20- we discussed at length- will do steroid injection of L knee- today- at 10:30am  1/21- injection has been helpful so far- explained cannot get again for 3 months.   1/22- will get L Knee MRI since still "tweaking him" so much- think he  has meniscal tear- if does, might need L brace to keep knee straight.   1/23- main limiter for d/c is pain/blindness- no brace since MRI only shows bone contusion, no tears of meniscus or ligament- will increase Norco to q4 hours prn and add Lyrica 50 mg TID for nerve pain.   1/25- pt said Lyrica helping nerve pain- con't regimen 4. Mood: LCSW to follow for evaluation and support.              -antipsychotic agents: N/A 5. Neuropsych: This patient is capable of making decisions on his own behalf. 6. Skin/Wound Care: Routine pressure relief measures. Continue surgical dressing for now.   1/18- d/c honeycomb dressing and place dry dressing daily.   1/25-  staples removed by NSU yesterday 7. Fluids/Electrolytes/Nutrition: Monitor I/O. Electrolytes stable 1/16  8. HTN with hypotension/orthostatic: Monitor BP tid. Monitor for orthostatic changes. Currently under excellent control- continue Norvasc and Microzide.  1/23-24- BP well controlled- con't meds  1/25- BP dropped to 85/50s yesterday- will give midodrine 5 mg daily prn for dizziness/lightheadedness- take if SBP <100-  9. Seizure prophylaxis: Continue keppra 500 mg bid-->should help with neuropathy also.   1/17- also c/o lisp- explained can take up to 2 months or so to go away sometimes.  10. Constipation: Last received senna-docusate 1/12.  1/17- LBM 2 days ago- denies constipation  1/18- LBM at least 3-4 days ago- will order sorbitol for bowels- since nothing documented.   1/19- didn't have any documented BM- will order Mg citrate after therapy.   1/20- refused mg citrate, but took another dose of Sorbitol- had extra large BM- but says 1/2 empty now  1/21- will give Mg citrate per pt request- after therapy today.   1/22- took mg citrate- no results- will check KUB portable.   1/25- BM last night- con't regimen- might need more Miralax at home if cannot go.  11. Hypoalbuminemia: encourage high protein foods  12. B/L L>R knee pain  1/17- will  order voltaren gel QID for now- doesn't want surgery- since on SQ Heparin, wary to start NSAID for bleeding risk.   1/18- wait today on knee injections- see how topical voltaren helping.  1/19- thinks helping enough- doesn't want injections right now  1/20- will injection L knee today- will also place lidocaine cream at 9:30am to help with pain of injection.   1/21- pain doing much better- injection not as bad as expected.   1/22- will get L knee MRI since think he has meniscal tear.   1/23- will increase Norco to q4 hours prn to take before therapy- no tears seen on MRI- no brace needed since no tears.   1/24 see prob #3 13. Dispo  1/25- d/c tomorrow- Rx's to [facility]- needs f/u with Dr Berline Chough- can see Riley Lam first, then me, since I don't have F/U for 7-8 weeks.   I spent a total of 35 minutes on total care today- >50% on coordination of care- talking with pt, then wife, then PT/PT student and PA about pt and meds-     LOS: 10 days A FACE TO FACE EVALUATION WAS PERFORMED  Albeiro Trompeter 12/03/2020, 10:07 AM

## 2020-12-03 NOTE — Progress Notes (Addendum)
Patient reports sleeping good, although he woke up at 0300, reports getting  Up at 0230 PTA to go to work. PRN vicodin given at 2011, along with ice to left knee. PRN robaxin given at 2259. Right anterior neck incision looks good, OTA. Good BM after sorbitol given on previous shift. Alfredo Martinez A

## 2020-12-03 NOTE — Consult Note (Signed)
Neuropsychological Consultation   Patient:   Wayne Mckinney   DOB:   [birth year]  MR Number:  [id number]  Location:  [facility] [address] CENTER A [address] [id number] [place] [address] Dept: [phone] Loc: [phone]           Date of Service:   12/03/2020  Start Time:   1 PM End Time:   1:35 PM  Provider/Observer:  Arley Phenix, Psy.D.       Clinical Neuropsychologist       Billing Code/Service: [phone]  Chief Complaint:    Wayne Mckinney 55-year-old male with history of hypertension, macular degeneration with significant visual loss, multiple falls that started 3 to 4 weeks prior to admission with bilateral lower extremity weakness and numbness as well as progressive balance deficits.  Work-up revealed cervical spondylosis with C6/C7 disc osteophyte with disc protrusion and severe canal stenosis and cord compression with edema and/or myelomalacia, moderate canal stenosis C5/C6 and moderate endplate edema C5/C6 and C6/C7.  Patient was admitted on 11/19/2020 for ACDF and postoperative order for hard collar and therapies were initiated.  On 11/21/2020 there was an episode of unresponsiveness with eye rolling backwards and seizure like activity and loss of bowel and bladder control.  Head CT was negative for acute changes and low-dose Keppra added due to concerns of seizure.  Patient has been progressive improvements during inpatient rehabilitation efforts and the plan is for discharge tomorrow.  Reason for Service:  Patient was referred for neuropsychological consultation due to coping adjustment with recent central cord cervical canal stenosis and neurosurgical interventions.  Below see HPI for the current admission.  HPI: Wayne Mckinney is a 55 year old male with history of HTN, macular degeneration with visual loss, multiple falls that started 3-4 weeks ago with BLE weakness and numbness as well as progressive balance  deficits.  Work up revealed cervical spondylosis with C6/C7 disc osteophyte with disc protrusion and severe canal stenosis and cord compression with edema and/or myelomalacia, moderate canal stenosis C5/C6 and moderate end plate edema C3/J6 and C6/C7.  He was admitted on 11/19/20 for ACDF C5-C7 by Dr. Jake Samples. Post op to wear hard collar and therapy initiated. On 01/13, he has episode of unresponsiveness with therapy with eye rolling backwards, seizure type activity and loss of B/B control.  CT head negative for acute changes and low dose Keppra added due to concerns of seizure. Therapy ongoing and patient noted to have limitations due to ataxia with LLE>RLE weakness, numbness and balance deficits. CIR recommended due to functional decline. He is currently ambulating 250 feet MinA with PT and is expected to have a 5-7 day stay. Pain is currently well controlled with medication.  Current Status:  Patient was alert and oriented and sitting in his wheelchair when entering the room.  The patient was at the end of his 15-minute observation timeframe following Covid vaccination.  Patient without issues with Covid vaccination.  Patient was alert and oriented with good mental status.  With significant visual deficits due to macular degeneration.  Patient reported good mood state and that he has An adjusted to not only his long-term progressive vision loss but also his recent cervical complications and issues and subsequent surgery.  The patient is progressing quite well and remains motivated to follow through with therapeutic interventions after discharge and is working out the finish details of what he needs to have once he is discharged home.  Patient denies any significant depression  or anxiety based symptomatology that are negatively impacting his recovery.  Behavioral Observation: Wayne Mckinney  presents as a 55 y.o.-year-old Right handed African American Male who appeared his stated age. his dress was Appropriate  and he was Well Groomed and his manners were Appropriate to the situation.  his participation was indicative of Appropriate and Attentive behaviors.  There were physical disabilities noted.  he displayed an appropriate level of cooperation and motivation.     Interactions:    Active Appropriate and Attentive  Attention:   within normal limits and attention span and concentration were age appropriate  Memory:   within normal limits; recent and remote memory intact  Visuo-spatial:  Patient is legally blind due to macular degeneration.  Speech (Volume):  low  Speech:   normal; normal  Thought Process:  Coherent and Relevant  Though Content:  WNL; not suicidal and not homicidal  Orientation:   person, place, time/date and situation  Judgment:   Good  Planning:   Fair  Affect:    Appropriate  Mood:    Dysphoric  Insight:   Good  Intelligence:   normal  Medical History:   Past Medical History:  Diagnosis Date  . Hypertension   . Legally blind 2007   due to macular degeneration          Patient Active Problem List   Diagnosis Date Noted  . Cervical myelopathy (HCC) 11/19/2020     Psychiatric History:  Patient with no prior psychiatric history.  Family Med/Psych History:  Family History  Problem Relation Age of Onset  . Cataracts Father     Impression/DX:  Wayne Mckinney 55-year-old male with history of hypertension, macular degeneration with significant visual loss, multiple falls that started 3 to 4 weeks prior to admission with bilateral lower extremity weakness and numbness as well as progressive balance deficits.  Work-up revealed cervical spondylosis with C6/C7 disc osteophyte with disc protrusion and severe canal stenosis and cord compression with edema and/or myelomalacia, moderate canal stenosis C5/C6 and moderate endplate edema C5/C6 and C6/C7.  Patient was admitted on 11/19/2020 for ACDF and postoperative order for hard collar and therapies were initiated.  On  11/21/2020 there was an episode of unresponsiveness with eye rolling backwards and seizure like activity and loss of bowel and bladder control.  Head CT was negative for acute changes and low-dose Keppra added due to concerns of seizure.  Patient has been progressive improvements during inpatient rehabilitation efforts and the plan is for discharge tomorrow.  Patient was alert and oriented and sitting in his wheelchair when entering the room.  The patient was at the end of his 15-minute observation timeframe following Covid vaccination.  Patient without issues with Covid vaccination.  Patient was alert and oriented with good mental status.  With significant visual deficits due to macular degeneration.  Patient reported good mood state and that he has An adjusted to not only his long-term progressive vision loss but also his recent cervical complications and issues and subsequent surgery.  The patient is progressing quite well and remains motivated to follow through with therapeutic interventions after discharge and is working out the finish details of what he needs to have once he is discharged home.  Patient denies any significant depression or anxiety based symptomatology that are negatively impacting his recovery.  Disposition/Plan:  Today we worked on coping and adjustment issues with plan for discharge tomorrow.         Electronically Signed   _______________________ Arley Phenix, Psy.D.  Clinical Neuropsychologist

## 2020-12-03 NOTE — Progress Notes (Signed)
Patient ID: Wayne Mckinney, male   DOB: 1966/06/06, 55 y.o.   MRN: [id number]  SW met with pt in room to discuss his concerns related to notary public for POA. SW informed chaplain services will only notarize for HCPOA done here in hospital. He intends to use a car service for transport home tomorrow at 11:30am and will need assistance downstairs. His wife will meet him at home.  Pt states he intends to get a TTB.   Loralee Pacas, MSW, [place] Office: [phone] Cell: [phone] Fax: [phone]

## 2020-12-03 NOTE — Progress Notes (Signed)
Occupational Therapy Session Note  Patient Details  Name: Wayne Mckinney MRN: [id number] Date of Birth: [birth year]  Today's Date: 12/03/2020 OT Individual Time: 1000-1055 OT Individual Time Calculation (min): 55 min    Short Term Goals: Week 1:  OT Short Term Goal 1 (Week 1): LTGs=STGs due to ELOS  Skilled Therapeutic Interventions/Progress Updates:    Pt completed bathing/dressing w/c level at sink with sit<>stand from w/c with supervision after setup.  Pt employs compensatory strategies/techniques for functional transfers and BADLs. Pt uses reacher to don socks but is able to doff socks and shoes without use of AE. Pt able to thread pants over BLE without use of AE. All functional tranfsers and amb with RW in room with supervision and verbal cues for navigation. Pt remained in recliner with all needs within reach. Pt pleased with progress and ready for discharge home tomorrow.   Therapy Documentation Precautions:  Precautions Precautions: Fall,Cervical Precaution Booklet Issued:  (discussed precautions) Precaution Comments: Blind Required Braces or Orthoses: Cervical Brace Cervical Brace: Hard collar,Other (comment) (don in sitting, ok to doff in bed) Restrictions Weight Bearing Restrictions: No  Pain: Pt denies pain this morning  Therapy/Group: Individual Therapy  Rich Brave 12/03/2020, 10:56 AM

## 2020-12-04 LAB — BASIC METABOLIC PANEL
Anion gap: 10 (ref 5–15)
BUN: 16 mg/dL (ref 6–20)
CO2: 27 mmol/L (ref 22–32)
Calcium: 10 mg/dL (ref 8.9–10.3)
Chloride: 101 mmol/L (ref 98–111)
Creatinine, Ser: 1.14 mg/dL (ref 0.61–1.24)
GFR, Estimated: >60 mL/min (ref >60)
Glucose, Bld: 97 mg/dL (ref 70–99)
Potassium: 3.4 mmol/L — ABNORMAL LOW (ref 3.5–5.1)
Sodium: 138 mmol/L (ref 135–145)

## 2020-12-04 MED ORDER — SENNOSIDES-DOCUSATE SODIUM 8.6-50 MG PO TABS: 1.0000 | ORAL_TABLET | Freq: Every evening | ORAL | 0 refills | Status: DC | PRN

## 2020-12-04 MED ORDER — HYDROCODONE-ACETAMINOPHEN 5-325 MG PO TABS: 1.0000 | ORAL_TABLET | Freq: Four times a day (QID) | ORAL | 0 refills | Status: DC | PRN

## 2020-12-04 MED ORDER — PREGABALIN 50 MG PO CAPS: 50.0000 mg | ORAL_CAPSULE | Freq: Three times a day (TID) | ORAL | 0 refills | Status: DC

## 2020-12-04 MED ORDER — DICLOFENAC SODIUM 1 % EX GEL: 4.0000 g | Freq: Four times a day (QID) | CUTANEOUS | 0 refills | Status: DC

## 2020-12-04 MED ORDER — PREGABALIN 50 MG PO CAPS: 50.0000 mg | ORAL_CAPSULE | Freq: Three times a day (TID) | ORAL | Status: DC

## 2020-12-04 MED ORDER — METHOCARBAMOL 500 MG PO TABS: 500.0000 mg | ORAL_TABLET | Freq: Four times a day (QID) | ORAL | 0 refills | Status: DC | PRN

## 2020-12-04 MED ORDER — MIDODRINE HCL 5 MG PO TABS: 5.0000 mg | ORAL_TABLET | Freq: Every day | ORAL | 0 refills | Status: DC | PRN

## 2020-12-04 MED ORDER — LEVETIRACETAM 500 MG PO TABS: 500.0000 mg | ORAL_TABLET | Freq: Two times a day (BID) | ORAL | 0 refills | Status: DC

## 2020-12-04 MED ORDER — POTASSIUM CHLORIDE CRYS ER 20 MEQ PO TBCR
40.0000 meq | EXTENDED_RELEASE_TABLET | Freq: Once | ORAL | Status: AC
  Administered 2020-12-04: 40 meq via ORAL
  Filled 2020-12-04: qty 2

## 2020-12-04 NOTE — Progress Notes (Signed)
[facility] PHYSICAL MEDICINE & REHABILITATION PROGRESS NOTE   Subjective/Complaints:  Pt reports ready for d/c- asking for reiteration of dizziness plan, pain meds plan, etc and when will f/u- will schedule f/u's, and needs midodrine IF SBP <100/dizzy- and then has to call in 5-7 days for refill of pain meds.    ROS:   Pt denies SOB, abd pain, CP, N/V/C/D, and vision changes   Objective:   No results found. Recent Labs    12/02/20 0522  WBC 4.0  HGB 13.1  HCT 38.0*  PLT 250   Recent Labs    12/02/20 0522 12/04/20 0510  NA 139 138  K 3.9 3.4*  CL 103 101  CO2 30 27  GLUCOSE 96 97  BUN 17 16  CREATININE 1.31* 1.14  CALCIUM 9.7 10.0    Intake/Output Summary (Last 24 hours) at 12/04/2020 0915 Last data filed at 12/04/2020 7106 Gross per 24 hour  Intake 760 ml  Output 2550 ml  Net -1790 ml        Physical Exam: Vital Signs Blood pressure 122/77, pulse 62, temperature 98.7 F (37.1 C), resp. rate 18, height 6\' 2"  (1.88 m), SpO2 96 %. Constitutional: sitting up in bedside chair, getitng self dressed including socks/slippers, RN in room, NAD HEENT: EOMI, oral membranes moist Neck:aspen collar and staples out- healing well Cardiovascular: RRR- NO JVD  Respiratory/Chest: CTA B/L- no W/R/R- good air movement GI/Abdomen: Soft, NT, ND, (+)BS  Ext: no clubbing, cyanosis, or edema Psych: appropriate Skin: intact except neck incision Neuro: Musculoskeletal:        General: Swelling present.     Comments: Left knee somewhat TTP along medial jt line, some swelling. right knee lesser so. Neurological: Ox3. blind   5/5 strength in upper extremities. 4/5 strength in lower extremities. Sensation is intact.    Assessment/Plan: 1. Functional deficits which require 3+ hours per day of interdisciplinary therapy in a comprehensive inpatient rehab setting.  Physiatrist is providing close team supervision and 24 hour management of active medical problems listed  below.  Physiatrist and rehab team continue to assess barriers to discharge/monitor patient progress toward functional and medical goals  Care Tool:  Bathing    Body parts bathed by patient: Right arm,Left arm,Chest,Abdomen,Front perineal area,Buttocks,Right upper leg,Left upper leg,Face,Right lower leg,Left lower leg   Body parts bathed by helper: Right lower leg,Left lower leg     Bathing assist Assist Level: Set up assist     Upper Body Dressing/Undressing Upper body dressing   What is the patient wearing?: Pull over shirt,Orthosis    Upper body assist Assist Level: Independent    Lower Body Dressing/Undressing Lower body dressing      What is the patient wearing?: Underwear/pull up,Pants     Lower body assist Assist for lower body dressing: Supervision/Verbal cueing     Toileting Toileting Toileting Activity did not occur (Clothing management and hygiene only): N/A (no void or bm)  Toileting assist Assist for toileting: Supervision/Verbal cueing Assistive Device Comment: urinal   Transfers Chair/bed transfer  Transfers assist     Chair/bed transfer assist level: Supervision/Verbal cueing Chair/bed transfer assistive device:   Ambulation assist      Assist level: Supervision/Verbal cueing Assistive device: Walker-rolling Max distance: 150'   Walk 10 feet activity   Assist     Assist level: Supervision/Verbal cueing Assistive device: Walker-rolling   Walk 50 feet activity   Assist    Assist level: Supervision/Verbal cueing Assistive device:  Walker-rolling    Walk 150 feet activity   Assist    Assist level: Supervision/Verbal cueing Assistive device: Walker-rolling    Walk 10 feet on uneven surface  activity   Assist Walk 10 feet on uneven surfaces activity did not occur: Refused         Wheelchair     Assist Will patient use wheelchair at discharge?: No             Wheelchair  50 feet with 2 turns activity    Assist            Wheelchair 150 feet activity     Assist          Blood pressure 122/77, pulse 62, temperature 98.7 F (37.1 C), resp. rate 18, height 6\' 2"  (1.88 m), SpO2 96 %.  Medical Problem List and Plan: 1.  Impaired mobility and ADLs secondary to cervical myelopathy s/p ACDF             -patient may shower but spinal incision must be covered.              -ELOS/Goals: modI in 5-7 days  -Continue CIR therapies including PT, OT  2.  Antithrombotics: -DVT/anticoagulation:  Pharmaceutical: Heparin 1/25- will not send home on Heparin since walking 150 ft at a time             -antiplatelet therapy: N/A 3. Pain Management: Hydrocodone prn seems to be effective. Will continue robaxin prn for back muscle spasms. Using approximately twice per day. Discussed decreasing frequency to q6H prn and patient is agreeable.  1/18- added voltaren gel for knee pain QID- if doesn't help enough, will need steroid injections for B/L knee pain  1/19- pt said voltaren is working enough- doesn't want injections right now  1/20- we discussed at length- will do steroid injection of L knee- today- at 10:30am  1/21- injection has been helpful so far- explained cannot get again for 3 months.   1/22- will get L Knee MRI since still "tweaking him" so much- think he has meniscal tear- if does, might need L brace to keep knee straight.   1/23- main limiter for d/c is pain/blindness- no brace since MRI only shows bone contusion, no tears of meniscus or ligament- will increase Norco to q4 hours prn and add Lyrica 50 mg TID for nerve pain.   1/25- pt said Lyrica helping nerve pain- con't regimen  1/26- will get 1 week of Norc and Lyrica- can fill after 5-7 days, the Norco 4. Mood: LCSW to follow for evaluation and support.              -antipsychotic agents: N/A 5. Neuropsych: This patient is capable of making decisions on his own behalf. 6. Skin/Wound Care: Routine  pressure relief measures. Continue surgical dressing for now.   1/18- d/c honeycomb dressing and place dry dressing daily.   1/25- staples removed by NSU yesterday 7. Fluids/Electrolytes/Nutrition: Monitor I/O. Electrolytes stable 1/16  8. HTN with hypotension/orthostatic: Monitor BP tid. Monitor for orthostatic changes. Currently under excellent control- continue Norvasc and Microzide.  1/23-24- BP well controlled- con't meds  1/25- BP dropped to 85/50s yesterday- will give midodrine 5 mg daily prn for dizziness/lightheadedness- take if SBP <100-  1/26- reinforced this to pt.   9. Seizure prophylaxis: Continue keppra 500 mg bid-->should help with neuropathy also.   1/17- also c/o lisp- explained can take up to 2 months or so to go away sometimes.  10.  Constipation: Last received senna-docusate 1/12.  1/17- LBM 2 days ago- denies constipation  1/18- LBM at least 3-4 days ago- will order sorbitol for bowels- since nothing documented.   1/19- didn't have any documented BM- will order Mg citrate after therapy.   1/20- refused mg citrate, but took another dose of Sorbitol- had extra large BM- but says 1/2 empty now  1/21- will give Mg citrate per pt request- after therapy today.   1/22- took mg citrate- no results- will check KUB portable.   1/25- BM last night- con't regimen- might need more Miralax at home if cannot go.  11. Hypoalbuminemia: encourage high protein foods  12. B/L L>R knee pain  1/17- will order voltaren gel QID for now- doesn't want surgery- since on SQ Heparin, wary to start NSAID for bleeding risk.   1/18- wait today on knee injections- see how topical voltaren helping.  1/19- thinks helping enough- doesn't want injections right now  1/20- will injection L knee today- will also place lidocaine cream at 9:30am to help with pain of injection.   1/21- pain doing much better- injection not as bad as expected.   1/22- will get L knee MRI since think he has meniscal tear.   1/23-  will increase Norco to q4 hours prn to take before therapy- no tears seen on MRI- no brace needed since no tears.   1/24 see prob #3 13. Dispo  1/25- d/c tomorrow- Rx's to [facility]- needs f/u with Dr Berline Chough- can see Riley Lam first, then me, since I don't have F/U for 7-8 weeks.  14. Hypokalemic  1/26- gave 40 mEq x1 and will have pt's PCP recheck- Cr down to 1.1 form 1.3- drinking better   I spent a total of 30 minutes on total care due to doing letter for pt/wife for FMLA as well as discussing medical issues as above.  >30 minutes spent on total care.   LOS: 11 days A FACE TO FACE EVALUATION WAS PERFORMED  Darinda [place] 12/04/2020, 9:15 AM

## 2020-12-04 NOTE — Progress Notes (Signed)
Patient ID: Wayne Mckinney, male   DOB: 09/08/1966, 55 y.o.   MRN: [id number]   SW provided pt with letter for his wife to provide to employer to support assistance pt will need at home.   Cecile Sheerer, MSW, LCSWA Office: [phone] Cell: [phone] Fax: [phone]

## 2020-12-04 NOTE — Discharge Summary (Signed)
Physician Discharge Summary  Patient ID: Wayne Mckinney MRN: [id number] DOB/AGE: 1966-10-13 55 y.o.  Admit date: 11/23/2020 Discharge date: 12/04/2020  Discharge Diagnoses:  Principal Problem:   Cervical myelopathy ([place]) Active Problems:   Contusion of knee, left   Hypokalemia   Constipation   Discharged Condition: Stable  Significant Diagnostic Studies: N/AA   Labs:  Basic Metabolic Panel: Recent Labs  Lab 12/02/20 0522 12/04/20 0510  NA 139 138  K 3.9 3.4*  CL 103 101  CO2 30 27  GLUCOSE 96 97  BUN 17 16  CREATININE 1.31* 1.14  CALCIUM 9.7 10.0    CBC: CBC Latest Ref Rng & Units 12/02/2020 11/25/2020 11/24/2020  WBC 4.0 - 10.5 K/uL 4.0 3.6(L) 3.8(L)  Hemoglobin 13.0 - 17.0 g/dL 13.1 13.6 13.4  Hematocrit 39.0 - 52.0 % 38.0(L) 40.8 40.8  Platelets 150 - 400 K/uL 250 254 225    CBG: No results for input(s): GLUCAP in the last 168 hours.  Brief HPI:   Wayne Mckinney is a 55 y.o. male with history of cervical myelopathy, HTN, visual loss, multiple falls with BLE numbness as well as progressive balance deficits with work-up revealing C6/C7 disc osteophyte with protrusion, severe canal stenosis and cord compression C5-C7.  He was admitted on 01/11 for ACDF C5-C7 by Dr. Reatha Armour.  Postop had episode of unresponsiveness with question of seizure type activity with loss of B&B.  CT head done was negative.  Low-dose Keppra added due to concerns of seizure.  Therapy was ongoing and patient was noted to have limitations due to ataxia, LLE greater than RLE weakness, numbness and balance deficits.  CIR was recommended due to functional decline.   Hospital Course: Wayne Mckinney was admitted to rehab 11/23/2020 for inpatient therapies to consist of PT, ST and OT at least three hours five days a week. Past admission physiatrist, therapy team and rehab RN have worked together to provide customized collaborative inpatient rehab. Blood pressures were monitored on 3 times daily basis and  controlled on Norvasc and Microzide.  He did have intermittent orthostatic episode and midodrine was ordered to be used 5 mg daily as needed lightheadedness or systolic blood pressure less than 100.  Bowel program has been augmented to manage constipation with good results.  He continues on Keppra 500 mg twice daily at this time for seizure prophylaxis. Follow-up CBC shows transient neutropenia has resolved.  Robaxin was used on as needed basis for muscle spasm. Lyrica 50 mg was added to help manage neuropathy and he reports decrease in numbness BLE  Check of electrolytes shows renal status improving with serum creatinine back to baseline.  Transient hypokalemia noted at discharge and was supplemented x1.  Recommend repeat BMET in 1 to 2 weeks to monitor for stability.    Voltaren gel was added for left knee pain with minimal improvement therefore knee was injected on 1/20.  He continued to have pain left knee therefore MRI knee done showing bone contusion but no meniscal or ligamentous tear.  Hydrocodone is also being used 1-2 times a day to help with pain control especially with increase in activity.   Cervical incision is healing well without any signs or symptoms of infection and staples DC'd on 01/25.  He has made good gains during his stay and is currently at supervision level.  He will continue to receive follow-up home health PT and OT   Rehab course: During patient's stay in rehab weekly team conferences were held to monitor patient's progress, set goals  and discuss barriers to discharge. At admission, patient required min assist with mobility and with ADL tasks. He has had improvement in activity tolerance, balance, postural control as well as ability to compensate for deficits.  He is able to complete ADL tasks with supervision.  He requires supervision for mobility.   Disposition: Home  Diet: Regular  Special Instructions: 1.  Recommend repeat be met in 1 to 2 weeks to monitor potassium. 2.   No driving or strenuous activity till cleared by neurosurgery. 3.  Continue hard collar for now--may remove when in bed or in chair with neck supported..   Allergies as of 12/04/2020   No Known Allergies     Medication List    TAKE these medications   amLODipine 10 MG tablet Commonly known as: NORVASC Take 10 mg by mouth daily.   diclofenac Sodium 1 % Gel Commonly known as: VOLTAREN Apply 4 g topically 4 (four) times daily. To both knees   hydrochlorothiazide 12.5 MG capsule Commonly known as: MICROZIDE Take 12.5 mg by mouth daily.   HYDROcodone-acetaminophen 5-325 MG tablet--Rx # 28 pills Commonly known as: NORCO/VICODIN Take 1 tablet by mouth every 6 (six) hours as needed for severe pain. What changed:   when to take this  reasons to take this   levETIRAcetam 500 MG tablet Commonly known as: KEPPRA Take 1 tablet (500 mg total) by mouth 2 (two) times daily.   methocarbamol 500 MG tablet Commonly known as: ROBAXIN Take 1 tablet (500 mg total) by mouth every 6 (six) hours as needed for muscle spasms.   midodrine 5 MG tablet Commonly known as: PROAMATINE Take 1 tablet (5 mg total) by mouth daily as needed (if SBP <100 and dizzy/lightheaded).   pregabalin 50 MG capsule Commonly known as: LYRICA Take 1 capsule (50 mg total) by mouth 3 (three) times daily.   senna-docusate 8.6-50 MG tablet Commonly known as: Senokot-S Take 1 tablet by mouth at bedtime as needed for mild constipation.       Follow-up Information    Lovorn, Jinny Blossom, MD Follow up.   Specialty: Physical Medicine and Rehabilitation Why: office will call you with follow up appointment Contact information: [address] [place] [address] [phone]        Newman Pies, MD Follow up.   Specialty: Neurosurgery Contact information: [address] [place] [address] [place] [address] [phone]        Nolene Ebbs, MD. Call.   Specialty: Internal Medicine Why: for  post  hospital follow up Contact information: [address] [place] [address] [phone]               Signed: Bary Leriche 12/06/2020, 6:44 PM

## 2020-12-04 NOTE — Discharge Instructions (Signed)
Inpatient Rehab Discharge Instructions  Wayne Mckinney Discharge date and time: 12/04/20   Activities/Precautions/ Functional Status: Activity: no lifting, driving, or strenuous exercise till cleared by Dr.Jenkins Diet: regular diet Wound Care: keep wound clean and dry    Functional status:  ___ No restrictions     ___ Walk up steps independently _X__ 24/7 supervision/assistance   ___ Walk up steps with assistance ___ Intermittent supervision/assistance  ___ Bathe/dress independently ___ Walk with walker     ___ Bathe/dress with assistance ___ Walk Independently    ___ Shower independently ___ Walk with assistance    _X__ Shower with assistance _X__ No alcohol     ___ Return to work/school ________   Special Instructions: 1. Take your blood pressure if you feel dizzy or lightheaded or blood pressure is below 100 take proamatine daily as needed.     COMMUNITY REFERRALS UPON DISCHARGE:    Home Health:   PT     OT                Agency: [facility] Phone: [phone] *Please expect follow-up within 2-3 days to schedule your home visit. If you have not received follow-up, be sure to contact the branch directly.*    Medical Equipment/Items Ordered: tub transfer bench (will purchase)                                                 Agency/Supplier: N/A    My questions have been answered and I understand these instructions. I will adhere to these goals and the provided educational materials after my discharge from the hospital.  Patient/Caregiver Signature _______________________________ Date __________  Clinician Signature _______________________________________ Date __________  Please bring this form and your medication list with you to all your follow-up doctor's appointments.

## 2020-12-04 NOTE — Progress Notes (Signed)
Patient's questions answered by MD and PA, escorted to car service by NT. No concerns at this time. All belongings accounted for.

## 2020-12-06 ENCOUNTER — Telehealth: Payer: Self-pay

## 2020-12-06 DIAGNOSIS — I10 Essential (primary) hypertension: Secondary | ICD-10-CM | POA: Diagnosis not present

## 2020-12-06 DIAGNOSIS — E876 Hypokalemia: Secondary | ICD-10-CM

## 2020-12-06 DIAGNOSIS — K5900 Constipation, unspecified: Secondary | ICD-10-CM

## 2020-12-06 DIAGNOSIS — S8002XA Contusion of left knee, initial encounter: Secondary | ICD-10-CM

## 2020-12-06 DIAGNOSIS — Z981 Arthrodesis status: Secondary | ICD-10-CM | POA: Diagnosis not present

## 2020-12-06 DIAGNOSIS — Z79899 Other long term (current) drug therapy: Secondary | ICD-10-CM | POA: Diagnosis not present

## 2020-12-06 DIAGNOSIS — Z79891 Long term (current) use of opiate analgesic: Secondary | ICD-10-CM | POA: Diagnosis not present

## 2020-12-06 DIAGNOSIS — Z4789 Encounter for other orthopedic aftercare: Secondary | ICD-10-CM | POA: Diagnosis not present

## 2020-12-06 DIAGNOSIS — Z87891 Personal history of nicotine dependence: Secondary | ICD-10-CM | POA: Diagnosis not present

## 2020-12-06 DIAGNOSIS — H3530 Unspecified macular degeneration: Secondary | ICD-10-CM | POA: Diagnosis not present

## 2020-12-06 DIAGNOSIS — H548 Legal blindness, as defined in USA: Secondary | ICD-10-CM | POA: Diagnosis not present

## 2020-12-06 DIAGNOSIS — R569 Unspecified convulsions: Secondary | ICD-10-CM | POA: Diagnosis not present

## 2020-12-06 DIAGNOSIS — M4712 Other spondylosis with myelopathy, cervical region: Secondary | ICD-10-CM | POA: Diagnosis not present

## 2020-12-06 NOTE — Telephone Encounter (Signed)
Wayne Mckinney, PT form [facility] called requesting HHPT 1wk1, 2wk2. Orders approved and given

## 2020-12-09 DIAGNOSIS — Z79899 Other long term (current) drug therapy: Secondary | ICD-10-CM | POA: Diagnosis not present

## 2020-12-09 DIAGNOSIS — Z79891 Long term (current) use of opiate analgesic: Secondary | ICD-10-CM | POA: Diagnosis not present

## 2020-12-09 DIAGNOSIS — H548 Legal blindness, as defined in USA: Secondary | ICD-10-CM | POA: Diagnosis not present

## 2020-12-09 DIAGNOSIS — Z4789 Encounter for other orthopedic aftercare: Secondary | ICD-10-CM | POA: Diagnosis not present

## 2020-12-09 DIAGNOSIS — H3530 Unspecified macular degeneration: Secondary | ICD-10-CM | POA: Diagnosis not present

## 2020-12-09 DIAGNOSIS — I10 Essential (primary) hypertension: Secondary | ICD-10-CM | POA: Diagnosis not present

## 2020-12-09 DIAGNOSIS — Z87891 Personal history of nicotine dependence: Secondary | ICD-10-CM | POA: Diagnosis not present

## 2020-12-09 DIAGNOSIS — Z981 Arthrodesis status: Secondary | ICD-10-CM | POA: Diagnosis not present

## 2020-12-09 DIAGNOSIS — M4712 Other spondylosis with myelopathy, cervical region: Secondary | ICD-10-CM | POA: Diagnosis not present

## 2020-12-09 DIAGNOSIS — R569 Unspecified convulsions: Secondary | ICD-10-CM | POA: Diagnosis not present

## 2020-12-11 ENCOUNTER — Telehealth: Payer: Self-pay

## 2020-12-11 DIAGNOSIS — Z981 Arthrodesis status: Secondary | ICD-10-CM | POA: Diagnosis not present

## 2020-12-11 DIAGNOSIS — H3530 Unspecified macular degeneration: Secondary | ICD-10-CM | POA: Diagnosis not present

## 2020-12-11 DIAGNOSIS — Z4789 Encounter for other orthopedic aftercare: Secondary | ICD-10-CM | POA: Diagnosis not present

## 2020-12-11 DIAGNOSIS — Z79891 Long term (current) use of opiate analgesic: Secondary | ICD-10-CM | POA: Diagnosis not present

## 2020-12-11 DIAGNOSIS — Z79899 Other long term (current) drug therapy: Secondary | ICD-10-CM | POA: Diagnosis not present

## 2020-12-11 DIAGNOSIS — Z87891 Personal history of nicotine dependence: Secondary | ICD-10-CM | POA: Diagnosis not present

## 2020-12-11 DIAGNOSIS — H548 Legal blindness, as defined in USA: Secondary | ICD-10-CM | POA: Diagnosis not present

## 2020-12-11 DIAGNOSIS — M4712 Other spondylosis with myelopathy, cervical region: Secondary | ICD-10-CM | POA: Diagnosis not present

## 2020-12-11 DIAGNOSIS — I10 Essential (primary) hypertension: Secondary | ICD-10-CM | POA: Diagnosis not present

## 2020-12-11 DIAGNOSIS — R569 Unspecified convulsions: Secondary | ICD-10-CM | POA: Diagnosis not present

## 2020-12-11 MED ORDER — HYDROCODONE-ACETAMINOPHEN 5-325 MG PO TABS: 1.0000 | ORAL_TABLET | Freq: Four times a day (QID) | ORAL | 0 refills | Status: DC | PRN

## 2020-12-11 NOTE — Telephone Encounter (Signed)
SW returned phone call to pt who was asking for number for attending Dr. Berline Chough. Pt states he has since spoken to someone from the clinic and the medication he needed has already been addressed by Dr. Berline Chough. He states he is all set with his home health therapies.

## 2020-12-11 NOTE — Telephone Encounter (Signed)
I sent in 30 day Rx for Norco 5/325 mg QID prn- #120-   Sent to [facility].

## 2020-12-11 NOTE — Telephone Encounter (Signed)
On 12/04/2020, Wayne Mckinney received the 7day supply of Hydrocodone 7-325 MG. He has requested a new Rx to be sent to [facility] on [address].

## 2020-12-13 ENCOUNTER — Telehealth: Payer: Self-pay | Admitting: *Deleted

## 2020-12-13 DIAGNOSIS — Z79899 Other long term (current) drug therapy: Secondary | ICD-10-CM | POA: Diagnosis not present

## 2020-12-13 DIAGNOSIS — H548 Legal blindness, as defined in USA: Secondary | ICD-10-CM | POA: Diagnosis not present

## 2020-12-13 DIAGNOSIS — M4712 Other spondylosis with myelopathy, cervical region: Secondary | ICD-10-CM | POA: Diagnosis not present

## 2020-12-13 DIAGNOSIS — I10 Essential (primary) hypertension: Secondary | ICD-10-CM | POA: Diagnosis not present

## 2020-12-13 DIAGNOSIS — Z87891 Personal history of nicotine dependence: Secondary | ICD-10-CM | POA: Diagnosis not present

## 2020-12-13 DIAGNOSIS — Z981 Arthrodesis status: Secondary | ICD-10-CM | POA: Diagnosis not present

## 2020-12-13 DIAGNOSIS — Z4789 Encounter for other orthopedic aftercare: Secondary | ICD-10-CM | POA: Diagnosis not present

## 2020-12-13 DIAGNOSIS — R569 Unspecified convulsions: Secondary | ICD-10-CM | POA: Diagnosis not present

## 2020-12-13 DIAGNOSIS — H3530 Unspecified macular degeneration: Secondary | ICD-10-CM | POA: Diagnosis not present

## 2020-12-13 DIAGNOSIS — Z79891 Long term (current) use of opiate analgesic: Secondary | ICD-10-CM | POA: Diagnosis not present

## 2020-12-13 NOTE — Telephone Encounter (Signed)
[name] OT [facility]  Called for POC 1wk1 2wk2  Approval given.

## 2020-12-16 ENCOUNTER — Encounter: Payer: BC Managed Care – PPO | Attending: Registered Nurse | Admitting: Registered Nurse

## 2020-12-16 ENCOUNTER — Other Ambulatory Visit: Payer: Self-pay

## 2020-12-16 VITALS — BP 150/79 | HR 74 | Temp 98.2°F | Ht 74.0 in | Wt 203.0 lb

## 2020-12-16 DIAGNOSIS — Z79891 Long term (current) use of opiate analgesic: Secondary | ICD-10-CM | POA: Diagnosis not present

## 2020-12-16 DIAGNOSIS — S8002XD Contusion of left knee, subsequent encounter: Secondary | ICD-10-CM | POA: Diagnosis not present

## 2020-12-16 DIAGNOSIS — G959 Disease of spinal cord, unspecified: Secondary | ICD-10-CM | POA: Diagnosis not present

## 2020-12-16 DIAGNOSIS — I10 Essential (primary) hypertension: Secondary | ICD-10-CM | POA: Diagnosis not present

## 2020-12-16 DIAGNOSIS — M4712 Other spondylosis with myelopathy, cervical region: Secondary | ICD-10-CM | POA: Diagnosis not present

## 2020-12-16 DIAGNOSIS — R569 Unspecified convulsions: Secondary | ICD-10-CM | POA: Diagnosis not present

## 2020-12-16 DIAGNOSIS — Z79899 Other long term (current) drug therapy: Secondary | ICD-10-CM | POA: Diagnosis not present

## 2020-12-16 DIAGNOSIS — H548 Legal blindness, as defined in USA: Secondary | ICD-10-CM | POA: Diagnosis not present

## 2020-12-16 DIAGNOSIS — H3530 Unspecified macular degeneration: Secondary | ICD-10-CM | POA: Diagnosis not present

## 2020-12-16 DIAGNOSIS — Z981 Arthrodesis status: Secondary | ICD-10-CM | POA: Diagnosis not present

## 2020-12-16 DIAGNOSIS — Z87891 Personal history of nicotine dependence: Secondary | ICD-10-CM | POA: Diagnosis not present

## 2020-12-16 DIAGNOSIS — Z4789 Encounter for other orthopedic aftercare: Secondary | ICD-10-CM | POA: Diagnosis not present

## 2020-12-16 MED ORDER — PREGABALIN 50 MG PO CAPS: 50.0000 mg | ORAL_CAPSULE | Freq: Three times a day (TID) | ORAL | 0 refills | Status: DC

## 2020-12-16 MED ORDER — HYDROCODONE-ACETAMINOPHEN 5-325 MG PO TABS: 1.0000 | ORAL_TABLET | Freq: Four times a day (QID) | ORAL | 0 refills | Status: DC | PRN

## 2020-12-16 NOTE — Progress Notes (Signed)
Subjective:    Patient ID: Wayne Mckinney, male    DOB: 10-28-1966, 55 y.o.   MRN: [id number]  HPI: Wayne Mckinney is a 55 y.o. male who is here for Hospital Follow up appointment of his Cervical Myelopathy and contusion of left knee. Mr. Wayne Mckinney was admitted to [facility] on 11/19/2020 for Severe cervical Myelopathy with cord compression and cord signal change, C-5-C-7. He was admitted for ACDF C5-C-7.  He underwent : see below by Dr Jake Samples on 11/19/2020.  ANTERIOR CERVICAL DECOMPRESSION FUSION CERVICAL FIVE-SIX, CERVICAL SIX-SEVEN  DG: Cervical Spine:  IMPRESSION: C5-C6 and C6-C7 anterior and interbody fusion.  He was admitted to inpatient rehabilitation on 11/23/2020 and discharged home on 12/04/2020. He is receiving home health therapy with [facility]. He denies any pain. He rates his pain 0. Also reports he has a good appetite.   Wife in room.  Pain Inventory Average Pain 8 Pain Right Now 0 My pain is intermittent, tingling and titch  In the last 24 hours, has pain interfered with the following? General activity 0 Relation with others 0 Enjoyment of life 0 What TIME of day is your pain at its worst? varies Sleep (in general) Fair  Pain is worse with: walking, standing and some activites Pain improves with: therapy/exercise and medication Relief from Meds: 9  walk with assistance use a walker how many minutes can you walk? 3-5 ability to climb steps?  yes do you drive?  no  employed # of hrs/week 40 what is your job? [employer] I need assistance with the following:  feeding, dressing, bathing, toileting, meal prep, household duties and shopping  numbness tingling trouble walking spasms  HFU  HFU    Family History  Problem Relation Age of Onset  . Cataracts Father    Social History   Socioeconomic History  . Marital status: Married    Spouse name: Not on file  . Number of children: Not on file  . Years of education: Not  on file  . Highest education level: Not on file  Occupational History  . Not on file  Tobacco Use  . Smoking status: Former Games developer  . Smokeless tobacco: Former Clinical biochemist  . Vaping Use: Never used  Substance and Sexual Activity  . Alcohol use: Yes    Alcohol/week: 2.0 - 3.0 standard drinks    Types: 2 - 3 Shots of liquor per week  . Drug use: Never  . Sexual activity: Not on file  Other Topics Concern  . Not on file  Social History Narrative  . Not on file   Social Determinants of Health   Financial Resource Strain: Not on file  Food Insecurity: Not on file  Transportation Needs: Not on file  Physical Activity: Not on file  Stress: Not on file  Social Connections: Not on file   Past Surgical History:  Procedure Laterality Date  . ANTERIOR CERVICAL DECOMP/DISCECTOMY FUSION N/A 11/19/2020   Procedure: ANTERIOR CERVICAL DECOMPRESSION FUSION CERVICAL FIVE-SIX, CERVICAL SIX-SEVEN;  Surgeon: Dawley, Alan Mulder, DO;  Location: [facility] OR;  Service: Neurosurgery;  Laterality: N/A;  anterior  . NO PAST SURGERIES     Past Medical History:  Diagnosis Date  . Hypertension   . Legally blind 2007   due to macular degeneration    BP (!) 150/79   Pulse 74   Temp 98.2 F (36.8 C)   Ht 6\' 2"  (1.88 m)   Wt 203 lb (92.1 kg)  SpO2 95%   BMI 26.06 kg/m   Opioid Risk Score:   Fall Risk Score:  `1  Depression screen PHQ 2/9  No flowsheet data found. Review of Systems  Musculoskeletal: Positive for back pain.       Knee pain  Neurological: Positive for numbness.       Tingling  All other systems reviewed and are negative.      Objective:   Physical Exam Vitals and nursing note reviewed.  Constitutional:      Appearance: Normal appearance.  Neck:     Comments: Wearing Cervical Collar Cardiovascular:     Rate and Rhythm: Normal rate and regular rhythm.     Pulses: Normal pulses.     Heart sounds: Normal heart sounds.  Pulmonary:     Effort: Pulmonary effort is normal.      Breath sounds: Normal breath sounds.  Musculoskeletal:     Cervical back: Normal range of motion and neck supple.     Comments: Normal Muscle Bulk and Muscle Testing Reveals:  Upper Extremities: Full ROM and Muscle Strength 5/5  Lumbar Paraspinal Tenderness: L-4-L-5 Lower Extremities: Right Lower Extremity: Full ROM and Muscle Strength 5/5 Left Lower Extremity: Decreased ROM and Muscle Strength 5/5 Left Lower Extremity Flexion Produces pain into his Left Patella Arrived in wheelchair  Skin:    General: Skin is warm and dry.  Neurological:     Mental Status: He is alert and oriented to person, place, and time.  Psychiatric:        Mood and Affect: Mood normal.        Behavior: Behavior normal.           Assessment & Plan:  1. Cervical Myelopathy: Continue Home Health Therapy with [facility]. Neurosurgery following. Continue to monitor.  2. Contusion of left knee. Had a MRI left Knee on 11/30/2020 IMPRESSION: 1. Motion degraded exam. 2. Focal marrow edema at the posterior aspect of the lateral tibial plateau without discrete fracture line. Findings may reflect bone contusion. 3. Medial and patellofemoral compartment osteoarthritis. 4. Trace knee joint effusion, nonspecific. 5. Intact menisci. Intact cruciate and collateral ligaments.Continue current medication regimen. Continue to monitor. PCP Following.   F/U with Dr Berline Chough in 4- 6 weeks

## 2020-12-17 DIAGNOSIS — I10 Essential (primary) hypertension: Secondary | ICD-10-CM | POA: Diagnosis not present

## 2020-12-17 DIAGNOSIS — M4712 Other spondylosis with myelopathy, cervical region: Secondary | ICD-10-CM | POA: Diagnosis not present

## 2020-12-18 DIAGNOSIS — M4712 Other spondylosis with myelopathy, cervical region: Secondary | ICD-10-CM | POA: Diagnosis not present

## 2020-12-18 DIAGNOSIS — Z981 Arthrodesis status: Secondary | ICD-10-CM | POA: Diagnosis not present

## 2020-12-18 DIAGNOSIS — Z87891 Personal history of nicotine dependence: Secondary | ICD-10-CM | POA: Diagnosis not present

## 2020-12-18 DIAGNOSIS — Z79899 Other long term (current) drug therapy: Secondary | ICD-10-CM | POA: Diagnosis not present

## 2020-12-18 DIAGNOSIS — R569 Unspecified convulsions: Secondary | ICD-10-CM | POA: Diagnosis not present

## 2020-12-18 DIAGNOSIS — H548 Legal blindness, as defined in USA: Secondary | ICD-10-CM | POA: Diagnosis not present

## 2020-12-18 DIAGNOSIS — Z4789 Encounter for other orthopedic aftercare: Secondary | ICD-10-CM | POA: Diagnosis not present

## 2020-12-18 DIAGNOSIS — H3530 Unspecified macular degeneration: Secondary | ICD-10-CM | POA: Diagnosis not present

## 2020-12-18 DIAGNOSIS — I10 Essential (primary) hypertension: Secondary | ICD-10-CM | POA: Diagnosis not present

## 2020-12-18 DIAGNOSIS — Z79891 Long term (current) use of opiate analgesic: Secondary | ICD-10-CM | POA: Diagnosis not present

## 2020-12-19 DIAGNOSIS — H3530 Unspecified macular degeneration: Secondary | ICD-10-CM | POA: Diagnosis not present

## 2020-12-19 DIAGNOSIS — Z981 Arthrodesis status: Secondary | ICD-10-CM | POA: Diagnosis not present

## 2020-12-19 DIAGNOSIS — Z87891 Personal history of nicotine dependence: Secondary | ICD-10-CM | POA: Diagnosis not present

## 2020-12-19 DIAGNOSIS — I10 Essential (primary) hypertension: Secondary | ICD-10-CM | POA: Diagnosis not present

## 2020-12-19 DIAGNOSIS — M4712 Other spondylosis with myelopathy, cervical region: Secondary | ICD-10-CM | POA: Diagnosis not present

## 2020-12-19 DIAGNOSIS — Z79891 Long term (current) use of opiate analgesic: Secondary | ICD-10-CM | POA: Diagnosis not present

## 2020-12-19 DIAGNOSIS — Z4789 Encounter for other orthopedic aftercare: Secondary | ICD-10-CM | POA: Diagnosis not present

## 2020-12-19 DIAGNOSIS — Z79899 Other long term (current) drug therapy: Secondary | ICD-10-CM | POA: Diagnosis not present

## 2020-12-19 DIAGNOSIS — R569 Unspecified convulsions: Secondary | ICD-10-CM | POA: Diagnosis not present

## 2020-12-19 DIAGNOSIS — H548 Legal blindness, as defined in USA: Secondary | ICD-10-CM | POA: Diagnosis not present

## 2020-12-20 ENCOUNTER — Telehealth: Payer: Self-pay

## 2020-12-20 DIAGNOSIS — Z981 Arthrodesis status: Secondary | ICD-10-CM | POA: Diagnosis not present

## 2020-12-20 DIAGNOSIS — H548 Legal blindness, as defined in USA: Secondary | ICD-10-CM | POA: Diagnosis not present

## 2020-12-20 DIAGNOSIS — Z79899 Other long term (current) drug therapy: Secondary | ICD-10-CM | POA: Diagnosis not present

## 2020-12-20 DIAGNOSIS — Z87891 Personal history of nicotine dependence: Secondary | ICD-10-CM | POA: Diagnosis not present

## 2020-12-20 DIAGNOSIS — Z79891 Long term (current) use of opiate analgesic: Secondary | ICD-10-CM | POA: Diagnosis not present

## 2020-12-20 DIAGNOSIS — M4712 Other spondylosis with myelopathy, cervical region: Secondary | ICD-10-CM | POA: Diagnosis not present

## 2020-12-20 DIAGNOSIS — H3530 Unspecified macular degeneration: Secondary | ICD-10-CM | POA: Diagnosis not present

## 2020-12-20 DIAGNOSIS — Z4789 Encounter for other orthopedic aftercare: Secondary | ICD-10-CM | POA: Diagnosis not present

## 2020-12-20 DIAGNOSIS — R569 Unspecified convulsions: Secondary | ICD-10-CM | POA: Diagnosis not present

## 2020-12-20 DIAGNOSIS — I10 Essential (primary) hypertension: Secondary | ICD-10-CM | POA: Diagnosis not present

## 2020-12-20 NOTE — Telephone Encounter (Signed)
Gerline PT with [facility] called: Seeking verbal orders for PT twice a week for 4 weeks. Hospital notes reviewed. Okay given. Call back ph [phone].

## 2020-12-24 ENCOUNTER — Encounter: Payer: Self-pay | Admitting: Registered Nurse

## 2020-12-24 DIAGNOSIS — I10 Essential (primary) hypertension: Secondary | ICD-10-CM | POA: Diagnosis not present

## 2020-12-24 DIAGNOSIS — Z981 Arthrodesis status: Secondary | ICD-10-CM | POA: Diagnosis not present

## 2020-12-24 DIAGNOSIS — Z79891 Long term (current) use of opiate analgesic: Secondary | ICD-10-CM | POA: Diagnosis not present

## 2020-12-24 DIAGNOSIS — Z87891 Personal history of nicotine dependence: Secondary | ICD-10-CM | POA: Diagnosis not present

## 2020-12-24 DIAGNOSIS — Z79899 Other long term (current) drug therapy: Secondary | ICD-10-CM | POA: Diagnosis not present

## 2020-12-24 DIAGNOSIS — Z4789 Encounter for other orthopedic aftercare: Secondary | ICD-10-CM | POA: Diagnosis not present

## 2020-12-24 DIAGNOSIS — R569 Unspecified convulsions: Secondary | ICD-10-CM | POA: Diagnosis not present

## 2020-12-24 DIAGNOSIS — H3530 Unspecified macular degeneration: Secondary | ICD-10-CM | POA: Diagnosis not present

## 2020-12-24 DIAGNOSIS — H548 Legal blindness, as defined in USA: Secondary | ICD-10-CM | POA: Diagnosis not present

## 2020-12-24 DIAGNOSIS — M4712 Other spondylosis with myelopathy, cervical region: Secondary | ICD-10-CM | POA: Diagnosis not present

## 2020-12-25 DIAGNOSIS — H548 Legal blindness, as defined in USA: Secondary | ICD-10-CM | POA: Diagnosis not present

## 2020-12-25 DIAGNOSIS — Z87891 Personal history of nicotine dependence: Secondary | ICD-10-CM | POA: Diagnosis not present

## 2020-12-25 DIAGNOSIS — I10 Essential (primary) hypertension: Secondary | ICD-10-CM | POA: Diagnosis not present

## 2020-12-25 DIAGNOSIS — M4712 Other spondylosis with myelopathy, cervical region: Secondary | ICD-10-CM | POA: Diagnosis not present

## 2020-12-25 DIAGNOSIS — Z981 Arthrodesis status: Secondary | ICD-10-CM | POA: Diagnosis not present

## 2020-12-25 DIAGNOSIS — Z4789 Encounter for other orthopedic aftercare: Secondary | ICD-10-CM | POA: Diagnosis not present

## 2020-12-25 DIAGNOSIS — Z79891 Long term (current) use of opiate analgesic: Secondary | ICD-10-CM | POA: Diagnosis not present

## 2020-12-25 DIAGNOSIS — Z79899 Other long term (current) drug therapy: Secondary | ICD-10-CM | POA: Diagnosis not present

## 2020-12-25 DIAGNOSIS — R569 Unspecified convulsions: Secondary | ICD-10-CM | POA: Diagnosis not present

## 2020-12-25 DIAGNOSIS — H3530 Unspecified macular degeneration: Secondary | ICD-10-CM | POA: Diagnosis not present

## 2020-12-26 ENCOUNTER — Other Ambulatory Visit: Payer: Self-pay | Admitting: Physical Medicine and Rehabilitation

## 2020-12-27 DIAGNOSIS — R569 Unspecified convulsions: Secondary | ICD-10-CM | POA: Diagnosis not present

## 2020-12-27 DIAGNOSIS — H3530 Unspecified macular degeneration: Secondary | ICD-10-CM | POA: Diagnosis not present

## 2020-12-27 DIAGNOSIS — M4712 Other spondylosis with myelopathy, cervical region: Secondary | ICD-10-CM | POA: Diagnosis not present

## 2020-12-27 DIAGNOSIS — I10 Essential (primary) hypertension: Secondary | ICD-10-CM | POA: Diagnosis not present

## 2020-12-27 DIAGNOSIS — Z4789 Encounter for other orthopedic aftercare: Secondary | ICD-10-CM | POA: Diagnosis not present

## 2020-12-27 DIAGNOSIS — Z981 Arthrodesis status: Secondary | ICD-10-CM | POA: Diagnosis not present

## 2020-12-27 DIAGNOSIS — Z87891 Personal history of nicotine dependence: Secondary | ICD-10-CM | POA: Diagnosis not present

## 2020-12-27 DIAGNOSIS — Z79891 Long term (current) use of opiate analgesic: Secondary | ICD-10-CM | POA: Diagnosis not present

## 2020-12-27 DIAGNOSIS — Z79899 Other long term (current) drug therapy: Secondary | ICD-10-CM | POA: Diagnosis not present

## 2020-12-27 DIAGNOSIS — H548 Legal blindness, as defined in USA: Secondary | ICD-10-CM | POA: Diagnosis not present

## 2020-12-28 DIAGNOSIS — H3530 Unspecified macular degeneration: Secondary | ICD-10-CM | POA: Diagnosis not present

## 2020-12-28 DIAGNOSIS — I10 Essential (primary) hypertension: Secondary | ICD-10-CM | POA: Diagnosis not present

## 2020-12-28 DIAGNOSIS — Z4789 Encounter for other orthopedic aftercare: Secondary | ICD-10-CM | POA: Diagnosis not present

## 2020-12-28 DIAGNOSIS — Z87891 Personal history of nicotine dependence: Secondary | ICD-10-CM | POA: Diagnosis not present

## 2020-12-28 DIAGNOSIS — Z79899 Other long term (current) drug therapy: Secondary | ICD-10-CM | POA: Diagnosis not present

## 2020-12-28 DIAGNOSIS — Z79891 Long term (current) use of opiate analgesic: Secondary | ICD-10-CM | POA: Diagnosis not present

## 2020-12-28 DIAGNOSIS — Z981 Arthrodesis status: Secondary | ICD-10-CM | POA: Diagnosis not present

## 2020-12-28 DIAGNOSIS — R569 Unspecified convulsions: Secondary | ICD-10-CM | POA: Diagnosis not present

## 2020-12-28 DIAGNOSIS — H548 Legal blindness, as defined in USA: Secondary | ICD-10-CM | POA: Diagnosis not present

## 2020-12-28 DIAGNOSIS — M4712 Other spondylosis with myelopathy, cervical region: Secondary | ICD-10-CM | POA: Diagnosis not present

## 2020-12-31 ENCOUNTER — Other Ambulatory Visit: Payer: Self-pay | Admitting: Physical Medicine and Rehabilitation

## 2021-01-01 ENCOUNTER — Telehealth: Payer: Self-pay

## 2021-01-01 ENCOUNTER — Other Ambulatory Visit: Payer: Self-pay | Admitting: Physical Medicine and Rehabilitation

## 2021-01-01 NOTE — Telephone Encounter (Signed)
Wayne Mckinney,

## 2021-01-01 NOTE — Telephone Encounter (Signed)
Darl Pikes, OT from [facility] called requesting verbal orders for [facility] 1wk3. Orders approved and given.

## 2021-01-02 DIAGNOSIS — Z87891 Personal history of nicotine dependence: Secondary | ICD-10-CM | POA: Diagnosis not present

## 2021-01-02 DIAGNOSIS — Z79899 Other long term (current) drug therapy: Secondary | ICD-10-CM | POA: Diagnosis not present

## 2021-01-02 DIAGNOSIS — R569 Unspecified convulsions: Secondary | ICD-10-CM | POA: Diagnosis not present

## 2021-01-02 DIAGNOSIS — M4712 Other spondylosis with myelopathy, cervical region: Secondary | ICD-10-CM | POA: Diagnosis not present

## 2021-01-02 DIAGNOSIS — H548 Legal blindness, as defined in USA: Secondary | ICD-10-CM | POA: Diagnosis not present

## 2021-01-02 DIAGNOSIS — Z79891 Long term (current) use of opiate analgesic: Secondary | ICD-10-CM | POA: Diagnosis not present

## 2021-01-02 DIAGNOSIS — Z4789 Encounter for other orthopedic aftercare: Secondary | ICD-10-CM | POA: Diagnosis not present

## 2021-01-02 DIAGNOSIS — H3530 Unspecified macular degeneration: Secondary | ICD-10-CM | POA: Diagnosis not present

## 2021-01-02 DIAGNOSIS — Z981 Arthrodesis status: Secondary | ICD-10-CM | POA: Diagnosis not present

## 2021-01-02 DIAGNOSIS — I10 Essential (primary) hypertension: Secondary | ICD-10-CM | POA: Diagnosis not present

## 2021-01-03 DIAGNOSIS — H3530 Unspecified macular degeneration: Secondary | ICD-10-CM | POA: Diagnosis not present

## 2021-01-03 DIAGNOSIS — Z79891 Long term (current) use of opiate analgesic: Secondary | ICD-10-CM | POA: Diagnosis not present

## 2021-01-03 DIAGNOSIS — Z981 Arthrodesis status: Secondary | ICD-10-CM | POA: Diagnosis not present

## 2021-01-03 DIAGNOSIS — Z79899 Other long term (current) drug therapy: Secondary | ICD-10-CM | POA: Diagnosis not present

## 2021-01-03 DIAGNOSIS — H548 Legal blindness, as defined in USA: Secondary | ICD-10-CM | POA: Diagnosis not present

## 2021-01-03 DIAGNOSIS — Z87891 Personal history of nicotine dependence: Secondary | ICD-10-CM | POA: Diagnosis not present

## 2021-01-03 DIAGNOSIS — R569 Unspecified convulsions: Secondary | ICD-10-CM | POA: Diagnosis not present

## 2021-01-03 DIAGNOSIS — M4712 Other spondylosis with myelopathy, cervical region: Secondary | ICD-10-CM | POA: Diagnosis not present

## 2021-01-03 DIAGNOSIS — Z4789 Encounter for other orthopedic aftercare: Secondary | ICD-10-CM | POA: Diagnosis not present

## 2021-01-03 DIAGNOSIS — I10 Essential (primary) hypertension: Secondary | ICD-10-CM | POA: Diagnosis not present

## 2021-01-06 DIAGNOSIS — H3530 Unspecified macular degeneration: Secondary | ICD-10-CM | POA: Diagnosis not present

## 2021-01-06 DIAGNOSIS — Z87891 Personal history of nicotine dependence: Secondary | ICD-10-CM | POA: Diagnosis not present

## 2021-01-06 DIAGNOSIS — Z981 Arthrodesis status: Secondary | ICD-10-CM | POA: Diagnosis not present

## 2021-01-06 DIAGNOSIS — Z4789 Encounter for other orthopedic aftercare: Secondary | ICD-10-CM | POA: Diagnosis not present

## 2021-01-06 DIAGNOSIS — Z79891 Long term (current) use of opiate analgesic: Secondary | ICD-10-CM | POA: Diagnosis not present

## 2021-01-06 DIAGNOSIS — H548 Legal blindness, as defined in USA: Secondary | ICD-10-CM | POA: Diagnosis not present

## 2021-01-06 DIAGNOSIS — M4712 Other spondylosis with myelopathy, cervical region: Secondary | ICD-10-CM | POA: Diagnosis not present

## 2021-01-06 DIAGNOSIS — Z79899 Other long term (current) drug therapy: Secondary | ICD-10-CM | POA: Diagnosis not present

## 2021-01-06 DIAGNOSIS — R569 Unspecified convulsions: Secondary | ICD-10-CM | POA: Diagnosis not present

## 2021-01-06 DIAGNOSIS — I10 Essential (primary) hypertension: Secondary | ICD-10-CM | POA: Diagnosis not present

## 2021-01-08 DIAGNOSIS — R569 Unspecified convulsions: Secondary | ICD-10-CM | POA: Diagnosis not present

## 2021-01-08 DIAGNOSIS — H3530 Unspecified macular degeneration: Secondary | ICD-10-CM | POA: Diagnosis not present

## 2021-01-08 DIAGNOSIS — I10 Essential (primary) hypertension: Secondary | ICD-10-CM | POA: Diagnosis not present

## 2021-01-08 DIAGNOSIS — Z4789 Encounter for other orthopedic aftercare: Secondary | ICD-10-CM | POA: Diagnosis not present

## 2021-01-08 DIAGNOSIS — Z79891 Long term (current) use of opiate analgesic: Secondary | ICD-10-CM | POA: Diagnosis not present

## 2021-01-08 DIAGNOSIS — Z87891 Personal history of nicotine dependence: Secondary | ICD-10-CM | POA: Diagnosis not present

## 2021-01-08 DIAGNOSIS — Z79899 Other long term (current) drug therapy: Secondary | ICD-10-CM | POA: Diagnosis not present

## 2021-01-08 DIAGNOSIS — M4712 Other spondylosis with myelopathy, cervical region: Secondary | ICD-10-CM | POA: Diagnosis not present

## 2021-01-08 DIAGNOSIS — H548 Legal blindness, as defined in USA: Secondary | ICD-10-CM | POA: Diagnosis not present

## 2021-01-08 DIAGNOSIS — Z981 Arthrodesis status: Secondary | ICD-10-CM | POA: Diagnosis not present

## 2021-01-10 DIAGNOSIS — H548 Legal blindness, as defined in USA: Secondary | ICD-10-CM | POA: Diagnosis not present

## 2021-01-10 DIAGNOSIS — Z87891 Personal history of nicotine dependence: Secondary | ICD-10-CM | POA: Diagnosis not present

## 2021-01-10 DIAGNOSIS — M4712 Other spondylosis with myelopathy, cervical region: Secondary | ICD-10-CM | POA: Diagnosis not present

## 2021-01-10 DIAGNOSIS — Z79891 Long term (current) use of opiate analgesic: Secondary | ICD-10-CM | POA: Diagnosis not present

## 2021-01-10 DIAGNOSIS — Z79899 Other long term (current) drug therapy: Secondary | ICD-10-CM | POA: Diagnosis not present

## 2021-01-10 DIAGNOSIS — R569 Unspecified convulsions: Secondary | ICD-10-CM | POA: Diagnosis not present

## 2021-01-10 DIAGNOSIS — Z4789 Encounter for other orthopedic aftercare: Secondary | ICD-10-CM | POA: Diagnosis not present

## 2021-01-10 DIAGNOSIS — I10 Essential (primary) hypertension: Secondary | ICD-10-CM | POA: Diagnosis not present

## 2021-01-10 DIAGNOSIS — H3530 Unspecified macular degeneration: Secondary | ICD-10-CM | POA: Diagnosis not present

## 2021-01-10 DIAGNOSIS — Z981 Arthrodesis status: Secondary | ICD-10-CM | POA: Diagnosis not present

## 2021-01-14 DIAGNOSIS — Z87891 Personal history of nicotine dependence: Secondary | ICD-10-CM | POA: Diagnosis not present

## 2021-01-14 DIAGNOSIS — H3530 Unspecified macular degeneration: Secondary | ICD-10-CM | POA: Diagnosis not present

## 2021-01-14 DIAGNOSIS — I10 Essential (primary) hypertension: Secondary | ICD-10-CM | POA: Diagnosis not present

## 2021-01-14 DIAGNOSIS — Z79899 Other long term (current) drug therapy: Secondary | ICD-10-CM | POA: Diagnosis not present

## 2021-01-14 DIAGNOSIS — M4712 Other spondylosis with myelopathy, cervical region: Secondary | ICD-10-CM | POA: Diagnosis not present

## 2021-01-14 DIAGNOSIS — H548 Legal blindness, as defined in USA: Secondary | ICD-10-CM | POA: Diagnosis not present

## 2021-01-14 DIAGNOSIS — Z4789 Encounter for other orthopedic aftercare: Secondary | ICD-10-CM | POA: Diagnosis not present

## 2021-01-14 DIAGNOSIS — R569 Unspecified convulsions: Secondary | ICD-10-CM | POA: Diagnosis not present

## 2021-01-14 DIAGNOSIS — Z981 Arthrodesis status: Secondary | ICD-10-CM | POA: Diagnosis not present

## 2021-01-14 DIAGNOSIS — Z79891 Long term (current) use of opiate analgesic: Secondary | ICD-10-CM | POA: Diagnosis not present

## 2021-01-16 ENCOUNTER — Telehealth: Payer: Self-pay | Admitting: *Deleted

## 2021-01-16 DIAGNOSIS — R569 Unspecified convulsions: Secondary | ICD-10-CM | POA: Diagnosis not present

## 2021-01-16 DIAGNOSIS — Z981 Arthrodesis status: Secondary | ICD-10-CM | POA: Diagnosis not present

## 2021-01-16 DIAGNOSIS — Z79891 Long term (current) use of opiate analgesic: Secondary | ICD-10-CM | POA: Diagnosis not present

## 2021-01-16 DIAGNOSIS — Z79899 Other long term (current) drug therapy: Secondary | ICD-10-CM | POA: Diagnosis not present

## 2021-01-16 DIAGNOSIS — H3530 Unspecified macular degeneration: Secondary | ICD-10-CM | POA: Diagnosis not present

## 2021-01-16 DIAGNOSIS — Z87891 Personal history of nicotine dependence: Secondary | ICD-10-CM | POA: Diagnosis not present

## 2021-01-16 DIAGNOSIS — I10 Essential (primary) hypertension: Secondary | ICD-10-CM | POA: Diagnosis not present

## 2021-01-16 DIAGNOSIS — H548 Legal blindness, as defined in USA: Secondary | ICD-10-CM | POA: Diagnosis not present

## 2021-01-16 DIAGNOSIS — M4712 Other spondylosis with myelopathy, cervical region: Secondary | ICD-10-CM | POA: Diagnosis not present

## 2021-01-16 DIAGNOSIS — Z4789 Encounter for other orthopedic aftercare: Secondary | ICD-10-CM | POA: Diagnosis not present

## 2021-01-16 NOTE — Telephone Encounter (Signed)
Jerilyn PT [facility] called to request POC extension 2wk2 and then recert for 2wk2, 1wk2.   SHe reports that Mr Gallick has new edema in his lower extremeties L>R with 3+ L and 2+ R. He is having new lower back pain and in his abd.  She called the PCP [facility], but they cannot see him and his appt with them is not until 02/06/21. He does have an appt with you on Monday. (I checked to see if you could see him tomorrow but no appts available). Please advise. Marcie Bal did tell him if he worsens or develops more symptoms he should see treatment in the ED.  Also as an FYI the [facility]o [employer] released him to go back to work and Mr Piatkowski did not feel he was ready and Marcie Bal PT agrees he is not ready. They have extended his ST disability.

## 2021-01-16 NOTE — Telephone Encounter (Signed)
If they are concerned that he has swelling from a DVT- then he needs to be seen at ER or PCP's office this week- if he waits til Monday when I see him, he could already have PE by then- he needs it addressed/ordered Friday.  FYI, he can have more therapy and I cannot make the determination about ST disability until the see the pt, but happy to renew his therapy for now. Thank you, ML

## 2021-01-17 ENCOUNTER — Other Ambulatory Visit: Payer: Self-pay

## 2021-01-17 ENCOUNTER — Encounter (HOSPITAL_COMMUNITY): Payer: Self-pay

## 2021-01-17 ENCOUNTER — Emergency Department (HOSPITAL_COMMUNITY): Payer: BC Managed Care – PPO

## 2021-01-17 ENCOUNTER — Emergency Department (HOSPITAL_BASED_OUTPATIENT_CLINIC_OR_DEPARTMENT_OTHER)
Admit: 2021-01-17 | Discharge: 2021-01-17 | Disposition: A | Discharge status: Home / Self Care | Payer: BC Managed Care – PPO | Attending: Emergency Medicine | Admitting: Emergency Medicine

## 2021-01-17 ENCOUNTER — Emergency Department (HOSPITAL_COMMUNITY)
Admission: EM | Admit: 2021-01-17 | Discharge: 2021-01-17 | Disposition: A | Discharge status: Home / Self Care | Payer: BC Managed Care – PPO | Attending: Emergency Medicine | Admitting: Emergency Medicine

## 2021-01-17 DIAGNOSIS — Z4789 Encounter for other orthopedic aftercare: Secondary | ICD-10-CM | POA: Diagnosis not present

## 2021-01-17 DIAGNOSIS — S80919A Unspecified superficial injury of unspecified knee, initial encounter: Secondary | ICD-10-CM | POA: Diagnosis not present

## 2021-01-17 DIAGNOSIS — X58XXXA Exposure to other specified factors, initial encounter: Secondary | ICD-10-CM | POA: Diagnosis not present

## 2021-01-17 DIAGNOSIS — R569 Unspecified convulsions: Secondary | ICD-10-CM | POA: Diagnosis not present

## 2021-01-17 DIAGNOSIS — M25562 Pain in left knee: Secondary | ICD-10-CM | POA: Diagnosis not present

## 2021-01-17 DIAGNOSIS — I10 Essential (primary) hypertension: Secondary | ICD-10-CM | POA: Diagnosis not present

## 2021-01-17 DIAGNOSIS — M4712 Other spondylosis with myelopathy, cervical region: Secondary | ICD-10-CM | POA: Diagnosis not present

## 2021-01-17 DIAGNOSIS — S83412A Sprain of medial collateral ligament of left knee, initial encounter: Secondary | ICD-10-CM | POA: Insufficient documentation

## 2021-01-17 DIAGNOSIS — R600 Localized edema: Secondary | ICD-10-CM | POA: Diagnosis not present

## 2021-01-17 DIAGNOSIS — Z87891 Personal history of nicotine dependence: Secondary | ICD-10-CM | POA: Diagnosis not present

## 2021-01-17 DIAGNOSIS — Z79891 Long term (current) use of opiate analgesic: Secondary | ICD-10-CM | POA: Diagnosis not present

## 2021-01-17 DIAGNOSIS — R609 Edema, unspecified: Secondary | ICD-10-CM

## 2021-01-17 DIAGNOSIS — H3530 Unspecified macular degeneration: Secondary | ICD-10-CM | POA: Diagnosis not present

## 2021-01-17 DIAGNOSIS — M7989 Other specified soft tissue disorders: Secondary | ICD-10-CM

## 2021-01-17 DIAGNOSIS — Z981 Arthrodesis status: Secondary | ICD-10-CM | POA: Diagnosis not present

## 2021-01-17 DIAGNOSIS — S83411A Sprain of medial collateral ligament of right knee, initial encounter: Secondary | ICD-10-CM | POA: Diagnosis not present

## 2021-01-17 DIAGNOSIS — H548 Legal blindness, as defined in USA: Secondary | ICD-10-CM | POA: Diagnosis not present

## 2021-01-17 DIAGNOSIS — Z79899 Other long term (current) drug therapy: Secondary | ICD-10-CM | POA: Diagnosis not present

## 2021-01-17 DIAGNOSIS — W19XXXA Unspecified fall, initial encounter: Secondary | ICD-10-CM | POA: Diagnosis not present

## 2021-01-17 DIAGNOSIS — S8992XA Unspecified injury of left lower leg, initial encounter: Secondary | ICD-10-CM | POA: Diagnosis not present

## 2021-01-17 LAB — BASIC METABOLIC PANEL
Anion gap: 12 (ref 5–15)
BUN: 16 mg/dL (ref 6–20)
CO2: 22 mmol/L (ref 22–32)
Calcium: 9.7 mg/dL (ref 8.9–10.3)
Chloride: 105 mmol/L (ref 98–111)
Creatinine, Ser: 1.02 mg/dL (ref 0.61–1.24)
GFR, Estimated: >60 mL/min (ref >60)
Glucose, Bld: 100 mg/dL — ABNORMAL HIGH (ref 70–99)
Potassium: 3.3 mmol/L — ABNORMAL LOW (ref 3.5–5.1)
Sodium: 139 mmol/L (ref 135–145)

## 2021-01-17 LAB — CBC
HCT: 42.7 % (ref 39.0–52.0)
Hemoglobin: 14.5 g/dL (ref 13.0–17.0)
MCH: 29.6 pg (ref 26.0–34.0)
MCHC: 34 g/dL (ref 30.0–36.0)
MCV: 87.1 fL (ref 80.0–100.0)
Platelets: 220 10*3/uL (ref 150–400)
RBC: 4.9 MIL/uL (ref 4.22–5.81)
RDW: 12.7 % (ref 11.5–15.5)
WBC: 5.2 10*3/uL (ref 4.0–10.5)
nRBC: 0 % (ref 0.0–0.2)

## 2021-01-17 LAB — BRAIN NATRIURETIC PEPTIDE: B Natriuretic Peptide: 43.4 pg/mL (ref 0.0–100.0)

## 2021-01-17 MED ORDER — POTASSIUM CHLORIDE CRYS ER 20 MEQ PO TBCR
40.0000 meq | EXTENDED_RELEASE_TABLET | Freq: Once | ORAL | Status: AC
  Administered 2021-01-17: 40 meq via ORAL
  Filled 2021-01-17: qty 2

## 2021-01-17 NOTE — Telephone Encounter (Signed)
Approval given to [facility] and information about swelling.

## 2021-01-17 NOTE — Progress Notes (Signed)
Left lower extremity venous duplex has been completed. Preliminary results can be found in CV Proc through chart review.  Results were given to Arthor Captain PA.  01/17/21 3:56 PM Olen Cordial RVT

## 2021-01-17 NOTE — ED Triage Notes (Signed)
Per EMS- Patient reports left knee swelling x 2 days. Patient had a fall in 12/21 and injured his left knee. Patient currently has home PT that noted that he had swelling to the left knee. Pain is 10/10 when ambulating. Patient also c/o swelling to the left groin area as well.

## 2021-01-17 NOTE — ED Provider Notes (Signed)
[facility]-EMERGENCY DEPT Provider Note   CSN: [id number] Arrival date & time: 01/17/21  1442     History Chief Complaint  Patient presents with  . Leg Swelling    Wayne Mckinney is a 55 y.o. male male who presents with a chief complaint of bilateral leg swelling worse on the left.  The patient states that back in December he had a fall due to weakness in the legs from cord compression and myelopathy from cervical disc issues.  The patient had an anterior cervical disc decompression in January.  He spent almost a month inpatient in rehab and was discharged home with occupational and physical therapy.  He states that during the initial fall in December his knees buckled toward the right and he has had severe pain and swelling in the left knee since that time.  He had an MRI of the left knee that showed focal marrow edema at the posterior aspect of the lateral tibial plateau without discrete fracture line consistent with bone contusion.  States that he has been doing more physical therapy recently and noticed that his left knee has been more painful and his swelling in the left lower extremity has been greater.  His physical therapist was concerned about this unilateral swelling and so had him sent into the emergency department for further evaluation.  He denies chest pain or shortness of breath.  HPI     Past Medical History:  Diagnosis Date  . Hypertension   . Legally blind 2007   due to macular degeneration     Patient Active Problem List   Diagnosis Date Noted  . Contusion of knee, left 12/06/2020  . Hypokalemia 12/06/2020  . Constipation 12/06/2020  . Cervical myelopathy (HCC) 11/19/2020    Past Surgical History:  Procedure Laterality Date  . ANTERIOR CERVICAL DECOMP/DISCECTOMY FUSION N/A 11/19/2020   Procedure: ANTERIOR CERVICAL DECOMPRESSION FUSION CERVICAL FIVE-SIX, CERVICAL SIX-SEVEN;  Surgeon: Dawley, Alan Mulder, DO;  Location: [facility] OR;  Service:  Neurosurgery;  Laterality: N/A;  anterior  . NO PAST SURGERIES         Family History  Problem Relation Age of Onset  . Cataracts Father     Social History   Tobacco Use  . Smoking status: Former Games developer  . Smokeless tobacco: Former Clinical biochemist  . Vaping Use: Never used  Substance Use Topics  . Alcohol use: Yes    Alcohol/week: 2.0 - 3.0 standard drinks    Types: 2 - 3 Shots of liquor per week  . Drug use: Never    Home Medications Prior to Admission medications   Medication Sig Start Date End Date Taking? Authorizing Provider  amLODipine (NORVASC) 10 MG tablet Take 10 mg by mouth daily. 05/23/19   [provider]  diclofenac Sodium (VOLTAREN) 1 % GEL APPLY 4 G TOPICALLY 4 (FOUR) TIMES DAILY TO BOTH KNEES 12/31/20   Lovorn, Aundra Millet, MD  hydrochlorothiazide (MICROZIDE) 12.5 MG capsule Take 12.5 mg by mouth daily. 05/23/19   [provider]  HYDROcodone-acetaminophen (NORCO/VICODIN) 5-325 MG tablet Take 1 tablet by mouth every 6 (six) hours as needed for severe pain. 12/16/20   Jones Bales, NP  levETIRAcetam (KEPPRA) 500 MG tablet TAKE 1 TABLET BY MOUTH TWICE A DAY 12/26/20   Lovorn, Aundra Millet, MD  methocarbamol (ROBAXIN) 500 MG tablet TAKE 1 TABLET BY MOUTH EVERY 6 HOURS AS NEEDED FOR MUSCLE SPASMS. 01/01/21   Lovorn, Aundra Millet, MD  midodrine (PROAMATINE) 5 MG tablet TAKE 1 TABLET (  5 MG TOTAL) BY MOUTH DAILY AS NEEDED (IF SBP <100 AND DIZZY/LIGHTHEADED). 01/01/21   Lovorn, Aundra Millet, MD  pregabalin (LYRICA) 50 MG capsule Take 1 capsule (50 mg total) by mouth 3 (three) times daily. 12/16/20   Jones Bales, NP  senna-docusate (SENOKOT-S) 8.6-50 MG tablet Take 1 tablet by mouth at bedtime as needed for mild constipation. 12/04/20   Jacquelynn Cree, PA-C    Allergies    Patient has no known allergies.  Review of Systems   Review of Systems Ten systems reviewed and are negative for acute change, except as noted in the HPI.   Physical Exam Updated Vital Signs BP (!)  163/91 (BP Location: Left Arm)   Pulse 76   Temp 98.4 F (36.9 C) (Oral)   Resp 16   Ht 6\' 2"  (1.88 m)   Wt 91.6 kg   SpO2 95%   BMI 25.94 kg/m   Physical Exam Vitals and nursing note reviewed.  Constitutional:      General: He is not in acute distress.    Appearance: He is well-developed. He is not diaphoretic.  HENT:     Head: Normocephalic and atraumatic.  Eyes:     General: No scleral icterus.    Conjunctiva/sclera: Conjunctivae normal.  Cardiovascular:     Rate and Rhythm: Normal rate and regular rhythm.     Heart sounds: Normal heart sounds.  Pulmonary:     Effort: Pulmonary effort is normal. No respiratory distress.     Breath sounds: Normal breath sounds.  Abdominal:     Palpations: Abdomen is soft.     Tenderness: There is no abdominal tenderness.  Musculoskeletal:     Cervical back: Normal range of motion and neck supple.     Right lower leg: Edema present.     Left lower leg: Edema present.     Comments: L>R edema 3+  Left knee swelling with tenderness along the medial and lateral joint line.  Unable to fully extend the knee due to pain and swelling.  Positive for joint effusion.  Skin:    General: Skin is warm and dry.  Neurological:     Mental Status: He is alert.  Psychiatric:        Behavior: Behavior normal.     ED Results / Procedures / Treatments   Labs (all labs ordered are listed, but only abnormal results are displayed) Labs Reviewed - No data to display  EKG None  Radiology No results found.  Procedures Procedures   Medications Ordered in ED Medications - No data to display  ED Course  I have reviewed the triage vital signs and the nursing notes.  Pertinent labs & imaging results that were available during my care of the patient were reviewed by me and considered in my medical decision making (see chart for details).    MDM Rules/Calculators/A&P                          55 year old male here for evaluation of left lower  extremity swelling.  Differential includes CHF, venous insufficiency DVT and cellulitis.  No evidence of cellulitis on clinical examination.  I ordered and reviewed labs which include CBC which shows no abnormalities, BNP within normal limits, BMP with mild hypokalemia repleted orally here in the emergency department.  I ordered and reviewed images which included a 2 view chest x-ray, 4 view knee and vascular ultrasound of the left lower extremity.  There is  no evidence of DVT on imaging.  Knee shows evidence of medial collateral ligament sprain and the chest x-ray is within normal limits.  Patient appears appropriate for discharge with outpatient follow-up with his surgeon for continued pain control and referral to orthopedics for his knee issue.  Patient has a walker and appears appropriate for discharge at this time Final Clinical Impression(s) / ED Diagnoses Final diagnoses:  None    Rx / DC Orders ED Discharge Orders    None       Arthor Captain, PA-C 01/17/21 2356    Rolan Bucco, MD 01/24/21 2330

## 2021-01-17 NOTE — Discharge Instructions (Signed)
Your labs showed slightly low potassium. You DO NOT have a clot in your leg.  YOu appear to have a knee sprain and need to follow up with the orthopedic doctor. PLEASE call to make an appointment as soon as possible. Follow up with your surgeon about your continued pain. Get help right away if you: Develop shortness of breath, especially when you are lying down. Have pain in your chest or abdomen. Feel weak. Feel faint.

## 2021-01-20 ENCOUNTER — Encounter: Payer: Self-pay | Admitting: Physical Medicine and Rehabilitation

## 2021-01-20 ENCOUNTER — Telehealth: Payer: Self-pay | Admitting: *Deleted

## 2021-01-20 ENCOUNTER — Encounter
Payer: BC Managed Care – PPO | Attending: Physical Medicine and Rehabilitation | Admitting: Physical Medicine and Rehabilitation

## 2021-01-20 ENCOUNTER — Other Ambulatory Visit: Payer: Self-pay

## 2021-01-20 VITALS — BP 134/68 | HR 58 | Temp 98.2°F | Ht 74.0 in | Wt 195.0 lb

## 2021-01-20 DIAGNOSIS — G959 Disease of spinal cord, unspecified: Secondary | ICD-10-CM | POA: Insufficient documentation

## 2021-01-20 DIAGNOSIS — Z79891 Long term (current) use of opiate analgesic: Secondary | ICD-10-CM | POA: Diagnosis not present

## 2021-01-20 DIAGNOSIS — S8002XD Contusion of left knee, subsequent encounter: Secondary | ICD-10-CM | POA: Diagnosis not present

## 2021-01-20 DIAGNOSIS — G894 Chronic pain syndrome: Secondary | ICD-10-CM | POA: Insufficient documentation

## 2021-01-20 DIAGNOSIS — R569 Unspecified convulsions: Secondary | ICD-10-CM | POA: Diagnosis not present

## 2021-01-20 DIAGNOSIS — G8250 Quadriplegia, unspecified: Secondary | ICD-10-CM | POA: Diagnosis not present

## 2021-01-20 DIAGNOSIS — R252 Cramp and spasm: Secondary | ICD-10-CM | POA: Insufficient documentation

## 2021-01-20 DIAGNOSIS — Z79899 Other long term (current) drug therapy: Secondary | ICD-10-CM | POA: Insufficient documentation

## 2021-01-20 MED ORDER — BACLOFEN 5 MG PO TABS: 10.0000 mg | ORAL_TABLET | Freq: Three times a day (TID) | ORAL | 5 refills | Status: DC

## 2021-01-20 NOTE — Patient Instructions (Signed)
Pt is a 55 yr old male with cervical myelopathy s/p ACDF, B/L blindness,  B/L L>R  knee pain s/p steroid injection 1/21,  Here for f/u.  1. Pt still needs to stay out of work- meets criteria for disability- due to incomplete quadriplegia. He can walk ~ 100-150 ft x1, however cannot sit for prolonged periods or stand for prolonged periods- I.e no more than 30-45 minutes due to spasticity which is forming- and is normal  In his case- we are trying to treat, however, his severe L >R knee pain and LE edema inhibits our ability to make him more functional than he is right now.  He is willing, but not able. Have me sent the disability paperwork-    2. Spasticity- new medicine- Baclofen- 5 mg 3x/day. For muscle tightness/spasms/tone/spasticity  8/12/4 pt suggests- which would be fine.  - first dose of Baclofen for the first hour-have someone with you-  If starts to have throat close up or have problems breathing, call 911. Won't increase until I see you back- FYI- let pharmacy know Im OK with both, BUT decreasing Robaxin   3. Do Home exercise program 5-6 days/week and do stretches daily- I.e 2-3x/day.   4. Decrease Robaxin/Methocarbemol to no more than 2x/day as needed since on baclofen.   5. Has number to call Ortho about knee- call- you are due to get another steroid injection of L knee 02/27/21- last injection 3 months prior (1.21.22)  6. Will refer to [facility]- since don't know if still needs Keppra for "? Seizures".  I think it could have been orthostatic hypotension.   7. F/U in 6 weeks.   8. NEED to ELEVATE legs as much as possible. - to level of your heart.

## 2021-01-20 NOTE — Telephone Encounter (Signed)
Darl Pikes OT [facility] called to extend Doniven's OT visits 1wk2.  Approval given.

## 2021-01-20 NOTE — Progress Notes (Signed)
Subjective:    Patient ID: Wayne Mckinney, male    DOB: 1966/01/17, 55 y.o.   MRN: [id number]  HPI  Pt is a 55 yr old male with cervical myelopathy s/p ACDF, B/L blindness,  B/L L>R  knee pain s/p steroid injection 1/21,  Here for f/u.   Also developed L>R LE edema.  R leg is cool- L leg is warm to touch.   L>R thigh is tight/painful and thinks related to  L thigh feels needs to be popped back into place.  When stands, and sits for prolonged period, gets real stiff (due to spasticity).   Still seeing H/H PT and OT- still needing them- comes 2x/week.  Working on- strength on legs and exercises given- doing HEP 3-5 days/week,   Still having pain from mid to lower back- tightness-   First fall from Seizure- per chart- but Dr Dawley doesn't think it was a seizure- still taking Keppra.   Still having low BP- taking Midodrine- as needed   No more constipation- voiding well.    Pain Inventory Average Pain 10 Pain Right Now 5 My pain is intermittent, burning, dull, stabbing, tingling and numbness  In the last 24 hours, has pain interfered with the following? General activity 5 Relation with others 0 Enjoyment of life 8 What TIME of day is your pain at its worst? varies Sleep (in general) Fair  Pain is worse with: walking, bending, sitting, standing and some activites Pain improves with: rest and therapy/exercise Relief from Meds: poor  Family History  Problem Relation Age of Onset  . Cataracts Father    Social History   Socioeconomic History  . Marital status: Married    Spouse name: Not on file  . Number of children: Not on file  . Years of education: Not on file  . Highest education level: Not on file  Occupational History  . Not on file  Tobacco Use  . Smoking status: Former Games developer  . Smokeless tobacco: Former Clinical biochemist  . Vaping Use: Never used  Substance and Sexual Activity  . Alcohol use: Yes    Alcohol/week: 2.0 - 3.0 standard drinks     Types: 2 - 3 Shots of liquor per week  . Drug use: Never  . Sexual activity: Not on file  Other Topics Concern  . Not on file  Social History Narrative  . Not on file   Social Determinants of Health   Financial Resource Strain: Not on file  Food Insecurity: Not on file  Transportation Needs: Not on file  Physical Activity: Not on file  Stress: Not on file  Social Connections: Not on file   Past Surgical History:  Procedure Laterality Date  . ANTERIOR CERVICAL DECOMP/DISCECTOMY FUSION N/A 11/19/2020   Procedure: ANTERIOR CERVICAL DECOMPRESSION FUSION CERVICAL FIVE-SIX, CERVICAL SIX-SEVEN;  Surgeon: Dawley, Alan Mulder, DO;  Location: [facility] OR;  Service: Neurosurgery;  Laterality: N/A;  anterior  . NO PAST SURGERIES     Past Surgical History:  Procedure Laterality Date  . ANTERIOR CERVICAL DECOMP/DISCECTOMY FUSION N/A 11/19/2020   Procedure: ANTERIOR CERVICAL DECOMPRESSION FUSION CERVICAL FIVE-SIX, CERVICAL SIX-SEVEN;  Surgeon: Dawley, Alan Mulder, DO;  Location: [facility] OR;  Service: Neurosurgery;  Laterality: N/A;  anterior  . NO PAST SURGERIES     Past Medical History:  Diagnosis Date  . Hypertension   . Legally blind 2007   due to macular degeneration    BP 134/68   Pulse (!) 58   Temp 98.2  F (36.8 C)   Ht 6\' 2"  (1.88 m)   Wt 195 lb (88.5 kg)   SpO2 98%   BMI 25.04 kg/m   Opioid Risk Score:   Fall Risk Score:  `1  Depression screen PHQ 2/9  Depression screen PHQ 2/9 12/16/2020  Decreased Interest 0  Down, Depressed, Hopeless 0  PHQ - 2 Score 0  Altered sleeping 1  Tired, decreased energy 1  Change in appetite 3  Feeling bad or failure about yourself  0  Trouble concentrating 0  Moving slowly or fidgety/restless 1  Suicidal thoughts 0  PHQ-9 Score 6  Difficult doing work/chores Somewhat difficult   Review of Systems  Musculoskeletal: Positive for back pain and gait problem.  All other systems reviewed and are negative.      Objective:   Physical Exam BP 134/66  today.   Awake, alert, wearing sunglasses, accompanied by wife, using RW to walk, NAD 3+ edema in LLE- to lower 1/3 of calf RLE edema 2+ to ankle  Neuro: MAS of 2 to 3 in L knee, hip, and ankle- no clonus MAS of 1+ in RLE. No clonus No hoffman's, no increased tone in UEs seen on exam today.   MS: UEs- 5-/5 in deltoids, biceps, triceps, WE, grip and 4+/5 in finger abd B/L LEs: RLE- HF 4/5, KE 4/5, KF 3+/5,  DF 4+/5, and PF 5-/5 LLE_ HF 4-/5, KE 4-/5, KF 3-/5, DF 4/5, and PF 4+/5        Assessment & Plan:   Pt is a 55 yr old male with cervical myelopathy s/p ACDF, B/L blindness,  B/L L>R  knee pain s/p steroid injection 1/21,  Here for f/u.  1. Pt still needs to stay out of work- meets criteria for disability- due to incomplete quadriplegia. He can walk ~ 100-150 ft x1, however cannot sit for prolonged periods or stand for prolonged periods- I.e no more than 30-45 minutes due to spasticity which is forming- and is normal  In his case- we are trying to treat, however, his severe L >R knee pain and LE edema inhibits our ability to make him more functional than he is right now.  He is willing, but not able. Have me sent the disability paperwork-    2. Spasticity- new medicine- Baclofen- 5 mg 3x/day. For muscle tightness/spasms/tone/spasticity  8/12/4 pt suggests- which would be fine.  - first dose of Baclofen for the first hour-have someone with you-  If starts to have throat close up or have problems breathing, call 911. Won't increase until I see you back-     3. Do Home exercise program 5-6 days/week and do stretches daily- I.e 2-3x/day.   4. Decrease Robaxin/Methocarbemol to no more than 2x/day as needed since on baclofen.   5. Has number to call Ortho about knee  6. Will refer to [facility]- since don't know if still needs Keppra for "? Seizures".  I think it could have been orthostatic hypotension.   7. F/U in 6 weeks.    8. NEED to ELEVATE legs as much as  possible. - to level of your heart.   I spent a total of 40 minutes on visit- as detailed above.

## 2021-01-21 DIAGNOSIS — M4712 Other spondylosis with myelopathy, cervical region: Secondary | ICD-10-CM | POA: Diagnosis not present

## 2021-01-21 DIAGNOSIS — Z981 Arthrodesis status: Secondary | ICD-10-CM | POA: Diagnosis not present

## 2021-01-21 DIAGNOSIS — I10 Essential (primary) hypertension: Secondary | ICD-10-CM | POA: Diagnosis not present

## 2021-01-21 DIAGNOSIS — R569 Unspecified convulsions: Secondary | ICD-10-CM | POA: Diagnosis not present

## 2021-01-21 DIAGNOSIS — Z4789 Encounter for other orthopedic aftercare: Secondary | ICD-10-CM | POA: Diagnosis not present

## 2021-01-21 DIAGNOSIS — Z79891 Long term (current) use of opiate analgesic: Secondary | ICD-10-CM | POA: Diagnosis not present

## 2021-01-21 DIAGNOSIS — H3530 Unspecified macular degeneration: Secondary | ICD-10-CM | POA: Diagnosis not present

## 2021-01-21 DIAGNOSIS — Z87891 Personal history of nicotine dependence: Secondary | ICD-10-CM | POA: Diagnosis not present

## 2021-01-21 DIAGNOSIS — Z79899 Other long term (current) drug therapy: Secondary | ICD-10-CM | POA: Diagnosis not present

## 2021-01-21 DIAGNOSIS — H548 Legal blindness, as defined in USA: Secondary | ICD-10-CM | POA: Diagnosis not present

## 2021-01-23 DIAGNOSIS — H548 Legal blindness, as defined in USA: Secondary | ICD-10-CM | POA: Diagnosis not present

## 2021-01-23 DIAGNOSIS — H3530 Unspecified macular degeneration: Secondary | ICD-10-CM | POA: Diagnosis not present

## 2021-01-23 DIAGNOSIS — M4712 Other spondylosis with myelopathy, cervical region: Secondary | ICD-10-CM | POA: Diagnosis not present

## 2021-01-23 DIAGNOSIS — Z4789 Encounter for other orthopedic aftercare: Secondary | ICD-10-CM | POA: Diagnosis not present

## 2021-01-23 DIAGNOSIS — R569 Unspecified convulsions: Secondary | ICD-10-CM | POA: Diagnosis not present

## 2021-01-23 DIAGNOSIS — Z79899 Other long term (current) drug therapy: Secondary | ICD-10-CM | POA: Diagnosis not present

## 2021-01-23 DIAGNOSIS — I10 Essential (primary) hypertension: Secondary | ICD-10-CM | POA: Diagnosis not present

## 2021-01-23 DIAGNOSIS — Z79891 Long term (current) use of opiate analgesic: Secondary | ICD-10-CM | POA: Diagnosis not present

## 2021-01-23 DIAGNOSIS — Z87891 Personal history of nicotine dependence: Secondary | ICD-10-CM | POA: Diagnosis not present

## 2021-01-23 DIAGNOSIS — Z981 Arthrodesis status: Secondary | ICD-10-CM | POA: Diagnosis not present

## 2021-01-24 ENCOUNTER — Ambulatory Visit (INDEPENDENT_AMBULATORY_CARE_PROVIDER_SITE_OTHER): Payer: BC Managed Care – PPO | Admitting: Neurology

## 2021-01-24 ENCOUNTER — Other Ambulatory Visit: Payer: Self-pay

## 2021-01-24 ENCOUNTER — Encounter: Payer: Self-pay | Admitting: Neurology

## 2021-01-24 VITALS — BP 133/84 | HR 64 | Resp 20 | Ht 74.0 in | Wt 195.0 lb

## 2021-01-24 DIAGNOSIS — R55 Syncope and collapse: Secondary | ICD-10-CM

## 2021-01-24 NOTE — Patient Instructions (Signed)
1. Schedule routine EEG  2. Our office will call with results, if normal, you can start reducing the Keppra to 1 tablet every night for 1 week, then stop medication  3. Follow-up as needed, call for any changes

## 2021-01-24 NOTE — Progress Notes (Signed)
NEUROLOGY CONSULTATION NOTE  Wayne Mckinney MRN: [id number] DOB: 1966/05/29  Referring provider: Dr. Genice Rouge Primary care provider: Dr. Fleet Contras  Reason for consult:  ?seizure  Dear Dr Berline Chough:  Thank you for your kind referral of Wayne Mckinney for consultation of the above symptoms. Although his history is well known to you, please allow me to reiterate it for the purpose of our medical record. He is alone in the office today. Records and images were personally reviewed where available.   HISTORY OF PRESENT ILLNESS: This is a pleasant 55 year old right-handed man with a history of cervical myelopathy s/p ACDF, bilateral blindness, orthostatic hypotension, presenting for evaluation of ?seizure. He has a history of bilateral leg numbness that started around 2000, however with progressive numbness/tingling/weakness in legs, and multiple falls, he was evaluated by Neurosurgery in January 2022. Evaluation revealed severe spinal canal stenosis with cord compression at C6-7. He underwent ACDF C5-7 on 11/19/20. On post-op day 2, he was ambulating with PT when he had an episode of loss of consciousness that lasted for 1 minute, with bowel/bladder incontinence. He recalls sweating profusely and telling staff he was dizzy. PT notes indicate his knees buckled and he reported dizziness and feeling that he was going to pass out. He sat on the wheelchair and his eyes rolled back, he was unresponsive and had a seizure-like episode for 1 minute. He was brought back to bed and became responsive, answering questions appropriately. He felt fine after, no muscle soreness or tongue bite. Vitals were noted to be stable. He had a head CT without contrast which did not show any acute changes, there was mild diffuse atrophy and chronic microvascular disease. He was started on Levetiracetam 500mg  BID.  He denies any prior history of seizures, and no further similar episodes of loss of consciousness since  11/21/20. He lives with his wife and denies any unresponsive episodes, olfactory/gustatory hallucinations, rising epigastric sensation, myoclonic jerks. He denies any headaches, no further dizziness, dysarthria/dysphagia, neck/back pain, bowel/bladder dysfunction. He denies any weakness/paresthesias in the upper extremities. He has numbness and tingling in both feet that seem to be subsiding, he is starting to wiggle his toes a little more. His left leg feels weaker, he reports left knee issues. He had complete vision loss in 2007 due to macular degeneration of both eyes. He ambulates with a walker at home. He had a normal birth and early development.  There is no history of febrile convulsions, CNS infections such as meningitis/encephalitis, significant traumatic brain injury, neurosurgical procedures, or family history of seizures.   PAST MEDICAL HISTORY: Past Medical History:  Diagnosis Date  . Hypertension   . Legally blind 2007   due to macular degeneration     PAST SURGICAL HISTORY: Past Surgical History:  Procedure Laterality Date  . ANTERIOR CERVICAL DECOMP/DISCECTOMY FUSION N/A 11/19/2020   Procedure: ANTERIOR CERVICAL DECOMPRESSION FUSION CERVICAL FIVE-SIX, CERVICAL SIX-SEVEN;  Surgeon: Dawley, 01/17/2021, DO;  Location: [facility] OR;  Service: Neurosurgery;  Laterality: N/A;  anterior  . NO PAST SURGERIES      MEDICATIONS: Current Outpatient Medications on File Prior to Visit  Medication Sig Dispense Refill  . amLODipine (NORVASC) 10 MG tablet Take 10 mg by mouth daily.    . baclofen 5 MG TABS Take 10 mg by mouth 3 (three) times daily. 90 tablet 5  . diclofenac Sodium (VOLTAREN) 1 % GEL APPLY 4 G TOPICALLY 4 (FOUR) TIMES DAILY TO BOTH KNEES 400 g 0  . hydrochlorothiazide (HYDRODIURIL)  12.5 MG tablet     . HYDROcodone-acetaminophen (NORCO/VICODIN) 5-325 MG tablet Take 1 tablet by mouth every 6 (six) hours as needed for severe pain. 120 tablet 0  . levETIRAcetam (KEPPRA) 500 MG tablet TAKE 1  TABLET BY MOUTH TWICE A DAY 60 tablet 0  . methocarbamol (ROBAXIN) 500 MG tablet TAKE 1 TABLET BY MOUTH EVERY 6 HOURS AS NEEDED FOR MUSCLE SPASMS. 90 tablet 5  . midodrine (PROAMATINE) 5 MG tablet TAKE 1 TABLET (5 MG TOTAL) BY MOUTH DAILY AS NEEDED (IF SBP <100 AND DIZZY/LIGHTHEADED). 60 tablet 5  . pregabalin (LYRICA) 50 MG capsule Take 1 capsule (50 mg total) by mouth 3 (three) times daily. 90 capsule 0  . senna-docusate (SENOKOT-S) 8.6-50 MG tablet Take 1 tablet by mouth at bedtime as needed for mild constipation. 30 tablet 0   No current facility-administered medications on file prior to visit.    ALLERGIES: No Known Allergies  FAMILY HISTORY: Family History  Problem Relation Age of Onset  . Cataracts Father     SOCIAL HISTORY: Social History   Socioeconomic History  . Marital status: Married    Spouse name: Not on file  . Number of children: Not on file  . Years of education: Not on file  . Highest education level: Not on file  Occupational History  . Not on file  Tobacco Use  . Smoking status: Former Games developer  . Smokeless tobacco: Former Clinical biochemist  . Vaping Use: Never used  Substance and Sexual Activity  . Alcohol use: Yes    Alcohol/week: 2.0 - 3.0 standard drinks    Types: 2 - 3 Shots of liquor per week  . Drug use: Never  . Sexual activity: Not on file  Other Topics Concern  . Not on file  Social History Narrative   Right handed   Drinks caffeine   Two story home   Social Determinants of Health   Financial Resource Strain: Not on file  Food Insecurity: Not on file  Transportation Needs: Not on file  Physical Activity: Not on file  Stress: Not on file  Social Connections: Not on file  Intimate Partner Violence: Not on file     PHYSICAL EXAM: Vitals:   01/24/21 1247  BP: 133/84  Pulse: 64  Resp: 20  SpO2: 98%   General: No acute distress, sitting on wheelchair Head:  Normocephalic/atraumatic, poor dentition Skin/Extremities: No rash, no  edema Neurological Exam: Mental status: alert and awake, no dysarthria or aphasia, Fund of knowledge is appropriate.  Recent and remote memory are intact.  Attention and concentration are normal.     Cranial nerves: CN I: not tested CN II: bilateral vision loss CN III, IV, VI:  full range of motion CN V: facial sensation intact CN VII: upper and lower face symmetric CN VIII: hearing intact to conversation Bulk & Tone: normal, no fasciculations. Motor: 5/5 on both UE, 4/5 both LE Sensation: intact to light touch, cold, pin on both UE and LE, decreased vibration sense to knees bilaterally.  Deep Tendon Reflexes: +1 both UE, right patella, +2 left patella Cerebellar: no incoordination on finger to nose testing Gait: not tested Tremor: none  IMPRESSION: This is a pleasant 55 year old right-handed man with a history of cervical myelopathy s/p ACDF, bilateral blindness, orthostatic hypotension, presenting for evaluation of ?seizure. Notes from hospitalization reviewed, he had an episode of loss of consciousness last 11/21/20 suggestive of convulsive syncope rather than epileptic seizure. EEG will be done  for completion. We discussed that if normal, he can start weaning off Keppra to 1 tab qhs for 1 week, then stop. He does not drive. Follow-up as needed, he knows to call for any changes.   Thank you for allowing me to participate in the care of this patient. Please do not hesitate to call for any questions or concerns.   Patrcia Dolly, M.D.  CC: Dr. Berline Chough, Dr. Concepcion Elk

## 2021-01-25 LAB — DRUG TOX MONITOR 1 W/CONF, ORAL FLD
Amphetamines: NEGATIVE ng/mL (ref <10)
Barbiturates: NEGATIVE ng/mL (ref <10)
Benzodiazepines: NEGATIVE ng/mL (ref <0.50)
Buprenorphine: NEGATIVE ng/mL (ref <0.10)
Buprenorphine: NEGATIVE ng/mL (ref <0.10)
Cocaine: NEGATIVE ng/mL (ref <5.0)
Fentanyl: NEGATIVE ng/mL (ref <0.10)
Heroin Metabolite: NEGATIVE ng/mL (ref <1.0)
MARIJUANA: NEGATIVE ng/mL (ref <2.5)
MDMA: NEGATIVE ng/mL (ref <10)
Meprobamate: NEGATIVE ng/mL (ref <2.5)
Methadone: NEGATIVE ng/mL (ref <5.0)
Naloxone: NEGATIVE ng/mL (ref <0.25)
Nicotine Metabolite: NEGATIVE ng/mL (ref <5.0)
Norbuprenorphine: NEGATIVE ng/mL (ref <0.50)
Opiates: NEGATIVE ng/mL (ref <2.5)
Phencyclidine: NEGATIVE ng/mL (ref <10)
Tapentadol: NEGATIVE ng/mL (ref <5.0)
Tramadol: NEGATIVE ng/mL (ref <5.0)
Zolpidem: NEGATIVE ng/mL (ref <5.0)

## 2021-01-25 LAB — DRUG TOX ALC METAB W/CON, ORAL FLD: Alcohol Metabolite: NEGATIVE ng/mL (ref <25)

## 2021-01-29 ENCOUNTER — Ambulatory Visit (INDEPENDENT_AMBULATORY_CARE_PROVIDER_SITE_OTHER): Payer: BC Managed Care – PPO | Admitting: Neurology

## 2021-01-29 ENCOUNTER — Other Ambulatory Visit: Payer: Self-pay

## 2021-01-29 DIAGNOSIS — R55 Syncope and collapse: Secondary | ICD-10-CM

## 2021-01-30 ENCOUNTER — Telehealth: Payer: Self-pay | Admitting: *Deleted

## 2021-01-30 DIAGNOSIS — H3530 Unspecified macular degeneration: Secondary | ICD-10-CM | POA: Diagnosis not present

## 2021-01-30 DIAGNOSIS — M4712 Other spondylosis with myelopathy, cervical region: Secondary | ICD-10-CM | POA: Diagnosis not present

## 2021-01-30 DIAGNOSIS — Z87891 Personal history of nicotine dependence: Secondary | ICD-10-CM | POA: Diagnosis not present

## 2021-01-30 DIAGNOSIS — R569 Unspecified convulsions: Secondary | ICD-10-CM | POA: Diagnosis not present

## 2021-01-30 DIAGNOSIS — I10 Essential (primary) hypertension: Secondary | ICD-10-CM | POA: Diagnosis not present

## 2021-01-30 DIAGNOSIS — Z4789 Encounter for other orthopedic aftercare: Secondary | ICD-10-CM | POA: Diagnosis not present

## 2021-01-30 DIAGNOSIS — H548 Legal blindness, as defined in USA: Secondary | ICD-10-CM | POA: Diagnosis not present

## 2021-01-30 DIAGNOSIS — Z79899 Other long term (current) drug therapy: Secondary | ICD-10-CM | POA: Diagnosis not present

## 2021-01-30 DIAGNOSIS — Z79891 Long term (current) use of opiate analgesic: Secondary | ICD-10-CM | POA: Diagnosis not present

## 2021-01-30 DIAGNOSIS — Z981 Arthrodesis status: Secondary | ICD-10-CM | POA: Diagnosis not present

## 2021-01-30 NOTE — Telephone Encounter (Signed)
Oral swab drug screen is negative for any medication.  He had previously bee prescribed hydrocodone but he was uncertain if he had taken any by H&H form. He is blind and his spouse has vision problems as well.

## 2021-01-31 ENCOUNTER — Telehealth: Payer: Self-pay

## 2021-01-31 DIAGNOSIS — Z981 Arthrodesis status: Secondary | ICD-10-CM | POA: Diagnosis not present

## 2021-01-31 DIAGNOSIS — H548 Legal blindness, as defined in USA: Secondary | ICD-10-CM | POA: Diagnosis not present

## 2021-01-31 DIAGNOSIS — H3530 Unspecified macular degeneration: Secondary | ICD-10-CM | POA: Diagnosis not present

## 2021-01-31 DIAGNOSIS — M4712 Other spondylosis with myelopathy, cervical region: Secondary | ICD-10-CM | POA: Diagnosis not present

## 2021-01-31 DIAGNOSIS — Z87891 Personal history of nicotine dependence: Secondary | ICD-10-CM | POA: Diagnosis not present

## 2021-01-31 DIAGNOSIS — R569 Unspecified convulsions: Secondary | ICD-10-CM | POA: Diagnosis not present

## 2021-01-31 DIAGNOSIS — Z79891 Long term (current) use of opiate analgesic: Secondary | ICD-10-CM | POA: Diagnosis not present

## 2021-01-31 DIAGNOSIS — I10 Essential (primary) hypertension: Secondary | ICD-10-CM | POA: Diagnosis not present

## 2021-01-31 DIAGNOSIS — Z79899 Other long term (current) drug therapy: Secondary | ICD-10-CM | POA: Diagnosis not present

## 2021-01-31 DIAGNOSIS — Z4789 Encounter for other orthopedic aftercare: Secondary | ICD-10-CM | POA: Diagnosis not present

## 2021-01-31 NOTE — Telephone Encounter (Signed)
Pt called and informed that EEG was normal. He can proceed with getting off Keppra, reduce to 1 tablet every night for 1 week, then stop. If any recurrence of seizure activity off medication, let us know. Pt verbalized understanding

## 2021-01-31 NOTE — Telephone Encounter (Signed)
-----   Message from Van Clines, MD sent at 01/31/2021 12:47 PM EDT ----- Pls let him know the EEG was normal. He can proceed with getting off Keppra, reduce to 1 tablet every night for 1 week, then stop. If any recurrence of seizure activity off medication, let us know. Thanks

## 2021-01-31 NOTE — Telephone Encounter (Signed)
Verbal okay given for extension of PT. Extension twice a week for 4 weeks. [facility], [place] ph [phone]).   Per protocol.

## 2021-01-31 NOTE — Procedures (Signed)
ELECTROENCEPHALOGRAM REPORT  Date of Study: 01/29/2021  Patient's Name: Wayne Mckinney MRN: [id number] Date of Birth: 1966-07-02  Referring Provider: Dr. Patrcia Dolly  Clinical History: This is a 55 year old man with an episode of loss of consciousness with shaking.   Medications: NORVASC 10 MG tablet baclofen 5 MG TABS VOLTAREN 1 % GEL HYDRODIURIL 12.5 MG tablet NORCO/VICODIN 5-325 MG tablet KEPPRA 500 MG tablet ROBAXIN 500 MG tablet PROAMATINE 5 MG tablet LYRICA 50 MG capsule SENOKOT-S 8.6-50 MG tablet  Technical Summary: A multichannel digital EEG recording measured by the international 10-20 system with electrodes applied with paste and impedances below 5000 ohms performed in our laboratory with EKG monitoring in an awake and drowsy patient.  Hyperventilation was not performed. Photic stimulation was performed.  The digital EEG was referentially recorded, reformatted, and digitally filtered in a variety of bipolar and referential montages for optimal display.    Description: The patient is awake and drowsy during the recording.  During maximal wakefulness, there is a symmetric, medium voltage 9 Hz posterior dominant rhythm that attenuates with eye opening.  The record is symmetric.  During drowsiness, there is an increase in theta slowing of the background, with shifting asymmetry over the bilateral temporal regions. Sleep was not captured. Photic stimulation did not elicit any abnormalities.  There were no epileptiform discharges or electrographic seizures seen.    EKG lead was unremarkable.  Impression: This awake and drowsy EEG is within normal limits.  Clinical Correlation: A normal EEG does not exclude a clinical diagnosis of epilepsy.  If further clinical questions remain, prolonged EEG may be helpful.  Clinical correlation is advised.   Patrcia Dolly, M.D.

## 2021-02-05 ENCOUNTER — Telehealth: Payer: Self-pay | Admitting: *Deleted

## 2021-02-05 DIAGNOSIS — G9520 Unspecified cord compression: Secondary | ICD-10-CM | POA: Diagnosis not present

## 2021-02-05 NOTE — Telephone Encounter (Signed)
Darl Pikes OT [facility] [address] called to request additional OT visits 1wk4. Needs more to be independent. His and his wife is separating and he is going to have to move. Approval given.

## 2021-02-06 DIAGNOSIS — Z125 Encounter for screening for malignant neoplasm of prostate: Secondary | ICD-10-CM | POA: Diagnosis not present

## 2021-02-06 DIAGNOSIS — R569 Unspecified convulsions: Secondary | ICD-10-CM | POA: Diagnosis not present

## 2021-02-06 DIAGNOSIS — I10 Essential (primary) hypertension: Secondary | ICD-10-CM | POA: Diagnosis not present

## 2021-02-06 DIAGNOSIS — Z87891 Personal history of nicotine dependence: Secondary | ICD-10-CM | POA: Diagnosis not present

## 2021-02-06 DIAGNOSIS — E559 Vitamin D deficiency, unspecified: Secondary | ICD-10-CM | POA: Diagnosis not present

## 2021-02-06 DIAGNOSIS — Z79891 Long term (current) use of opiate analgesic: Secondary | ICD-10-CM | POA: Diagnosis not present

## 2021-02-06 DIAGNOSIS — M4712 Other spondylosis with myelopathy, cervical region: Secondary | ICD-10-CM | POA: Diagnosis not present

## 2021-02-06 DIAGNOSIS — Z981 Arthrodesis status: Secondary | ICD-10-CM | POA: Diagnosis not present

## 2021-02-06 DIAGNOSIS — Z13220 Encounter for screening for lipoid disorders: Secondary | ICD-10-CM | POA: Diagnosis not present

## 2021-02-06 DIAGNOSIS — H3530 Unspecified macular degeneration: Secondary | ICD-10-CM | POA: Diagnosis not present

## 2021-02-06 DIAGNOSIS — Z0000 Encounter for general adult medical examination without abnormal findings: Secondary | ICD-10-CM | POA: Diagnosis not present

## 2021-02-06 DIAGNOSIS — Z79899 Other long term (current) drug therapy: Secondary | ICD-10-CM | POA: Diagnosis not present

## 2021-02-06 DIAGNOSIS — Z131 Encounter for screening for diabetes mellitus: Secondary | ICD-10-CM | POA: Diagnosis not present

## 2021-02-06 DIAGNOSIS — H548 Legal blindness, as defined in USA: Secondary | ICD-10-CM | POA: Diagnosis not present

## 2021-02-06 DIAGNOSIS — Z4789 Encounter for other orthopedic aftercare: Secondary | ICD-10-CM | POA: Diagnosis not present

## 2021-02-08 ENCOUNTER — Other Ambulatory Visit: Payer: Self-pay | Admitting: Physical Medicine and Rehabilitation

## 2021-02-10 ENCOUNTER — Telehealth: Payer: Self-pay

## 2021-02-10 DIAGNOSIS — I10 Essential (primary) hypertension: Secondary | ICD-10-CM | POA: Diagnosis not present

## 2021-02-10 DIAGNOSIS — R569 Unspecified convulsions: Secondary | ICD-10-CM | POA: Diagnosis not present

## 2021-02-10 DIAGNOSIS — Z4789 Encounter for other orthopedic aftercare: Secondary | ICD-10-CM | POA: Diagnosis not present

## 2021-02-10 DIAGNOSIS — M4712 Other spondylosis with myelopathy, cervical region: Secondary | ICD-10-CM | POA: Diagnosis not present

## 2021-02-10 DIAGNOSIS — Z87891 Personal history of nicotine dependence: Secondary | ICD-10-CM | POA: Diagnosis not present

## 2021-02-10 DIAGNOSIS — Z79899 Other long term (current) drug therapy: Secondary | ICD-10-CM | POA: Diagnosis not present

## 2021-02-10 DIAGNOSIS — H3530 Unspecified macular degeneration: Secondary | ICD-10-CM | POA: Diagnosis not present

## 2021-02-10 DIAGNOSIS — H548 Legal blindness, as defined in USA: Secondary | ICD-10-CM | POA: Diagnosis not present

## 2021-02-10 DIAGNOSIS — Z981 Arthrodesis status: Secondary | ICD-10-CM | POA: Diagnosis not present

## 2021-02-10 DIAGNOSIS — Z79891 Long term (current) use of opiate analgesic: Secondary | ICD-10-CM | POA: Diagnosis not present

## 2021-02-10 NOTE — Telephone Encounter (Signed)
Darl Pikes, OT from [facility] called on behalf of patient stating pharmacy has not received refill for Baclofen for patient. Called in Baclofen 10 mg one three times daily #90 with 5 refill, from the original script that was sent to pharmacy on 01/20/21.

## 2021-02-12 ENCOUNTER — Telehealth: Payer: Self-pay | Admitting: *Deleted

## 2021-02-12 DIAGNOSIS — Z981 Arthrodesis status: Secondary | ICD-10-CM | POA: Diagnosis not present

## 2021-02-12 DIAGNOSIS — R569 Unspecified convulsions: Secondary | ICD-10-CM | POA: Diagnosis not present

## 2021-02-12 DIAGNOSIS — Z4789 Encounter for other orthopedic aftercare: Secondary | ICD-10-CM | POA: Diagnosis not present

## 2021-02-12 DIAGNOSIS — Z79899 Other long term (current) drug therapy: Secondary | ICD-10-CM | POA: Diagnosis not present

## 2021-02-12 DIAGNOSIS — Z79891 Long term (current) use of opiate analgesic: Secondary | ICD-10-CM | POA: Diagnosis not present

## 2021-02-12 DIAGNOSIS — H3530 Unspecified macular degeneration: Secondary | ICD-10-CM | POA: Diagnosis not present

## 2021-02-12 DIAGNOSIS — I10 Essential (primary) hypertension: Secondary | ICD-10-CM | POA: Diagnosis not present

## 2021-02-12 DIAGNOSIS — M4712 Other spondylosis with myelopathy, cervical region: Secondary | ICD-10-CM | POA: Diagnosis not present

## 2021-02-12 DIAGNOSIS — H548 Legal blindness, as defined in USA: Secondary | ICD-10-CM | POA: Diagnosis not present

## 2021-02-12 DIAGNOSIS — Z87891 Personal history of nicotine dependence: Secondary | ICD-10-CM | POA: Diagnosis not present

## 2021-02-12 NOTE — Telephone Encounter (Signed)
Wayne Mckinney called and is asking to speak with Dr Berline Chough. I called to get more information and he was calling about his baclofen. He has no started it yet but when he got from the pharmacy it says take 2 of the 5 mg tablets tid. He said he was told to take one tablet, so he is uncertain how he should proceed. He is going to take one table tonight and see how he feels and wait for further directions from Dr Berline Chough.

## 2021-02-14 DIAGNOSIS — H3530 Unspecified macular degeneration: Secondary | ICD-10-CM | POA: Diagnosis not present

## 2021-02-14 DIAGNOSIS — Z87891 Personal history of nicotine dependence: Secondary | ICD-10-CM | POA: Diagnosis not present

## 2021-02-14 DIAGNOSIS — Z79891 Long term (current) use of opiate analgesic: Secondary | ICD-10-CM | POA: Diagnosis not present

## 2021-02-14 DIAGNOSIS — Z981 Arthrodesis status: Secondary | ICD-10-CM | POA: Diagnosis not present

## 2021-02-14 DIAGNOSIS — Z79899 Other long term (current) drug therapy: Secondary | ICD-10-CM | POA: Diagnosis not present

## 2021-02-14 DIAGNOSIS — Z4789 Encounter for other orthopedic aftercare: Secondary | ICD-10-CM | POA: Diagnosis not present

## 2021-02-14 DIAGNOSIS — R569 Unspecified convulsions: Secondary | ICD-10-CM | POA: Diagnosis not present

## 2021-02-14 DIAGNOSIS — I10 Essential (primary) hypertension: Secondary | ICD-10-CM | POA: Diagnosis not present

## 2021-02-14 DIAGNOSIS — M4712 Other spondylosis with myelopathy, cervical region: Secondary | ICD-10-CM | POA: Diagnosis not present

## 2021-02-14 DIAGNOSIS — H548 Legal blindness, as defined in USA: Secondary | ICD-10-CM | POA: Diagnosis not present

## 2021-02-18 DIAGNOSIS — R569 Unspecified convulsions: Secondary | ICD-10-CM | POA: Diagnosis not present

## 2021-02-18 DIAGNOSIS — Z4789 Encounter for other orthopedic aftercare: Secondary | ICD-10-CM | POA: Diagnosis not present

## 2021-02-18 DIAGNOSIS — I10 Essential (primary) hypertension: Secondary | ICD-10-CM | POA: Diagnosis not present

## 2021-02-18 DIAGNOSIS — Z981 Arthrodesis status: Secondary | ICD-10-CM | POA: Diagnosis not present

## 2021-02-18 DIAGNOSIS — H3530 Unspecified macular degeneration: Secondary | ICD-10-CM | POA: Diagnosis not present

## 2021-02-18 DIAGNOSIS — Z79899 Other long term (current) drug therapy: Secondary | ICD-10-CM | POA: Diagnosis not present

## 2021-02-18 DIAGNOSIS — Z87891 Personal history of nicotine dependence: Secondary | ICD-10-CM | POA: Diagnosis not present

## 2021-02-18 DIAGNOSIS — H548 Legal blindness, as defined in USA: Secondary | ICD-10-CM | POA: Diagnosis not present

## 2021-02-18 DIAGNOSIS — M4712 Other spondylosis with myelopathy, cervical region: Secondary | ICD-10-CM | POA: Diagnosis not present

## 2021-02-18 DIAGNOSIS — Z79891 Long term (current) use of opiate analgesic: Secondary | ICD-10-CM | POA: Diagnosis not present

## 2021-02-19 NOTE — Telephone Encounter (Signed)
Have called pt back- he was to start with 5 mg 3x/day and if no side effects but not working well enough, to increase to 10 mg 3x/day- which is likely 2 tabs 3x/day-  I spoke with him about this- also just moved and processing that.  Otherwise, no other issues.

## 2021-02-20 DIAGNOSIS — M4712 Other spondylosis with myelopathy, cervical region: Secondary | ICD-10-CM | POA: Diagnosis not present

## 2021-02-20 DIAGNOSIS — Z981 Arthrodesis status: Secondary | ICD-10-CM | POA: Diagnosis not present

## 2021-02-20 DIAGNOSIS — I10 Essential (primary) hypertension: Secondary | ICD-10-CM | POA: Diagnosis not present

## 2021-02-20 DIAGNOSIS — H3530 Unspecified macular degeneration: Secondary | ICD-10-CM | POA: Diagnosis not present

## 2021-02-20 DIAGNOSIS — H548 Legal blindness, as defined in USA: Secondary | ICD-10-CM | POA: Diagnosis not present

## 2021-02-20 DIAGNOSIS — Z87891 Personal history of nicotine dependence: Secondary | ICD-10-CM | POA: Diagnosis not present

## 2021-02-20 DIAGNOSIS — Z79891 Long term (current) use of opiate analgesic: Secondary | ICD-10-CM | POA: Diagnosis not present

## 2021-02-20 DIAGNOSIS — Z79899 Other long term (current) drug therapy: Secondary | ICD-10-CM | POA: Diagnosis not present

## 2021-02-20 DIAGNOSIS — R569 Unspecified convulsions: Secondary | ICD-10-CM | POA: Diagnosis not present

## 2021-02-20 DIAGNOSIS — Z4789 Encounter for other orthopedic aftercare: Secondary | ICD-10-CM | POA: Diagnosis not present

## 2021-02-22 DIAGNOSIS — Z79899 Other long term (current) drug therapy: Secondary | ICD-10-CM | POA: Diagnosis not present

## 2021-02-22 DIAGNOSIS — H3530 Unspecified macular degeneration: Secondary | ICD-10-CM | POA: Diagnosis not present

## 2021-02-22 DIAGNOSIS — Z4789 Encounter for other orthopedic aftercare: Secondary | ICD-10-CM | POA: Diagnosis not present

## 2021-02-22 DIAGNOSIS — Z981 Arthrodesis status: Secondary | ICD-10-CM | POA: Diagnosis not present

## 2021-02-22 DIAGNOSIS — H548 Legal blindness, as defined in USA: Secondary | ICD-10-CM | POA: Diagnosis not present

## 2021-02-22 DIAGNOSIS — Z79891 Long term (current) use of opiate analgesic: Secondary | ICD-10-CM | POA: Diagnosis not present

## 2021-02-22 DIAGNOSIS — Z87891 Personal history of nicotine dependence: Secondary | ICD-10-CM | POA: Diagnosis not present

## 2021-02-22 DIAGNOSIS — M4712 Other spondylosis with myelopathy, cervical region: Secondary | ICD-10-CM | POA: Diagnosis not present

## 2021-02-22 DIAGNOSIS — R569 Unspecified convulsions: Secondary | ICD-10-CM | POA: Diagnosis not present

## 2021-02-22 DIAGNOSIS — I10 Essential (primary) hypertension: Secondary | ICD-10-CM | POA: Diagnosis not present

## 2021-02-26 DIAGNOSIS — Z79899 Other long term (current) drug therapy: Secondary | ICD-10-CM | POA: Diagnosis not present

## 2021-02-26 DIAGNOSIS — M4712 Other spondylosis with myelopathy, cervical region: Secondary | ICD-10-CM | POA: Diagnosis not present

## 2021-02-26 DIAGNOSIS — Z981 Arthrodesis status: Secondary | ICD-10-CM | POA: Diagnosis not present

## 2021-02-26 DIAGNOSIS — R569 Unspecified convulsions: Secondary | ICD-10-CM | POA: Diagnosis not present

## 2021-02-26 DIAGNOSIS — I10 Essential (primary) hypertension: Secondary | ICD-10-CM | POA: Diagnosis not present

## 2021-02-26 DIAGNOSIS — H3530 Unspecified macular degeneration: Secondary | ICD-10-CM | POA: Diagnosis not present

## 2021-02-26 DIAGNOSIS — Z87891 Personal history of nicotine dependence: Secondary | ICD-10-CM | POA: Diagnosis not present

## 2021-02-26 DIAGNOSIS — Z79891 Long term (current) use of opiate analgesic: Secondary | ICD-10-CM | POA: Diagnosis not present

## 2021-02-26 DIAGNOSIS — Z4789 Encounter for other orthopedic aftercare: Secondary | ICD-10-CM | POA: Diagnosis not present

## 2021-02-26 DIAGNOSIS — H548 Legal blindness, as defined in USA: Secondary | ICD-10-CM | POA: Diagnosis not present

## 2021-03-04 DIAGNOSIS — R569 Unspecified convulsions: Secondary | ICD-10-CM | POA: Diagnosis not present

## 2021-03-04 DIAGNOSIS — Z4789 Encounter for other orthopedic aftercare: Secondary | ICD-10-CM | POA: Diagnosis not present

## 2021-03-04 DIAGNOSIS — H3530 Unspecified macular degeneration: Secondary | ICD-10-CM | POA: Diagnosis not present

## 2021-03-04 DIAGNOSIS — Z981 Arthrodesis status: Secondary | ICD-10-CM | POA: Diagnosis not present

## 2021-03-04 DIAGNOSIS — I10 Essential (primary) hypertension: Secondary | ICD-10-CM | POA: Diagnosis not present

## 2021-03-04 DIAGNOSIS — H548 Legal blindness, as defined in USA: Secondary | ICD-10-CM | POA: Diagnosis not present

## 2021-03-04 DIAGNOSIS — Z79891 Long term (current) use of opiate analgesic: Secondary | ICD-10-CM | POA: Diagnosis not present

## 2021-03-04 DIAGNOSIS — Z87891 Personal history of nicotine dependence: Secondary | ICD-10-CM | POA: Diagnosis not present

## 2021-03-04 DIAGNOSIS — M4712 Other spondylosis with myelopathy, cervical region: Secondary | ICD-10-CM | POA: Diagnosis not present

## 2021-03-04 DIAGNOSIS — Z79899 Other long term (current) drug therapy: Secondary | ICD-10-CM | POA: Diagnosis not present

## 2021-03-10 ENCOUNTER — Encounter: Payer: Self-pay | Admitting: Physical Medicine and Rehabilitation

## 2021-03-10 ENCOUNTER — Other Ambulatory Visit: Payer: Self-pay

## 2021-03-10 ENCOUNTER — Encounter
Payer: BC Managed Care – PPO | Attending: Physical Medicine and Rehabilitation | Admitting: Physical Medicine and Rehabilitation

## 2021-03-10 VITALS — BP 134/77 | HR 57 | Temp 98.0°F | Ht 74.0 in | Wt 184.8 lb

## 2021-03-10 DIAGNOSIS — I10 Essential (primary) hypertension: Secondary | ICD-10-CM | POA: Diagnosis not present

## 2021-03-10 DIAGNOSIS — G959 Disease of spinal cord, unspecified: Secondary | ICD-10-CM | POA: Diagnosis not present

## 2021-03-10 DIAGNOSIS — M1711 Unilateral primary osteoarthritis, right knee: Secondary | ICD-10-CM | POA: Diagnosis not present

## 2021-03-10 DIAGNOSIS — M1712 Unilateral primary osteoarthritis, left knee: Secondary | ICD-10-CM | POA: Insufficient documentation

## 2021-03-10 DIAGNOSIS — S8002XS Contusion of left knee, sequela: Secondary | ICD-10-CM | POA: Diagnosis not present

## 2021-03-10 DIAGNOSIS — Z981 Arthrodesis status: Secondary | ICD-10-CM | POA: Diagnosis not present

## 2021-03-10 DIAGNOSIS — M4712 Other spondylosis with myelopathy, cervical region: Secondary | ICD-10-CM | POA: Diagnosis not present

## 2021-03-10 DIAGNOSIS — H3530 Unspecified macular degeneration: Secondary | ICD-10-CM | POA: Diagnosis not present

## 2021-03-10 DIAGNOSIS — Z87891 Personal history of nicotine dependence: Secondary | ICD-10-CM | POA: Diagnosis not present

## 2021-03-10 DIAGNOSIS — Z79899 Other long term (current) drug therapy: Secondary | ICD-10-CM | POA: Diagnosis not present

## 2021-03-10 DIAGNOSIS — R569 Unspecified convulsions: Secondary | ICD-10-CM | POA: Diagnosis not present

## 2021-03-10 DIAGNOSIS — Z4789 Encounter for other orthopedic aftercare: Secondary | ICD-10-CM | POA: Diagnosis not present

## 2021-03-10 DIAGNOSIS — Z79891 Long term (current) use of opiate analgesic: Secondary | ICD-10-CM | POA: Diagnosis not present

## 2021-03-10 DIAGNOSIS — H548 Legal blindness, as defined in USA: Secondary | ICD-10-CM | POA: Diagnosis not present

## 2021-03-10 MED ORDER — BACLOFEN 10 MG PO TABS: 15.0000 mg | ORAL_TABLET | Freq: Three times a day (TID) | ORAL | 5 refills | Status: DC

## 2021-03-10 NOTE — Patient Instructions (Signed)
Plan: 1. Needs written Rx for outpt PT 1-2x/week to work on strengthening, as well as gait   2. Baclofen 15 mg TID x 1 week, then if needed, increase to 20 mg 3x/day- for spasticity- if develops side effects OR doesn't work, clal me and I will change to Dantrolene.   3. steroid injection was performed at B/L knee injections using 1% plain Lidocaine and 40mg  /1cc of Kenalog. This was well tolerated.  Cleaned with betadine x3 and allowed to dry- then alcohol then injected using 27 gauge 1.5 inch needle- no bleeding or complications.    F/U in 3 months for steroid injections of B/L knee injections  Lidocaine will kick in 15 minutes- and wear off tonight- the steroid will kick in tomorrow within 24 hours and take up to 72 hours to fully kick in.  4. F/U in 3 months- call if needs my help earlier. Might need injections, per your request at next visit.

## 2021-03-10 NOTE — Progress Notes (Signed)
  Pt is a 55 yr old male with cervical myelopathy s/p ACDF, B/L blindness,  B/L L>R  knee pain s/p steroid injection 1/21,  Here for f/u on SCI.   Just got discharged from PT and OT this Am.- H/H therapy.  When sitting, knees don't really hurt.  When stands, both knees REALLY hurt- was concerned about the pain- that could be nerve pain/pinched nerve.    Wants to go to [facility] for outpt PT  Baclofen doesn't appear to be helping.  Is taking 10 mg 3x/day- hasn't helped at all.  Still tight.  No side effects  Exam:  Awake, alert, wearing sunglasses, using cane, alone, NAD MAS of 1+ to 2 in LLE now; and MAS of 1 to 1+ in RLE Using cane to walk No significant change in strength  Plan: 1. Needs written Rx for outpt PT 1-2x/week to work on strengthening, as well as gait   2. Baclofen 15 mg TID x 1 week, then if needed, increase to 20 mg 3x/day- for spasticity- if develops side effects OR doesn't work, clal me and I will change to Dantrolene.   3. steroid injection was performed at B/L knee injections using 1% plain Lidocaine and 40mg  /1cc of Kenalog. This was well tolerated.  Cleaned with betadine x3 and allowed to dry- then alcohol then injected using 27 gauge 1.5 inch needle- no bleeding or complications.    F/U in 3 months for steroid injections of B/L knee injections  Lidocaine will kick in 15 minutes- and wear off tonight- the steroid will kick in tomorrow within 24 hours and take up to 72 hours to fully kick in.  4. F/U in 3 months- call if needs my help earlier. Might need to do injections of knees at that time.

## 2021-04-09 DIAGNOSIS — M25569 Pain in unspecified knee: Secondary | ICD-10-CM | POA: Diagnosis not present

## 2021-04-09 DIAGNOSIS — M4712 Other spondylosis with myelopathy, cervical region: Secondary | ICD-10-CM | POA: Diagnosis not present

## 2021-04-09 DIAGNOSIS — I10 Essential (primary) hypertension: Secondary | ICD-10-CM | POA: Diagnosis not present

## 2021-04-09 DIAGNOSIS — M5136 Other intervertebral disc degeneration, lumbar region: Secondary | ICD-10-CM | POA: Diagnosis not present

## 2021-05-07 DIAGNOSIS — M170 Bilateral primary osteoarthritis of knee: Secondary | ICD-10-CM | POA: Diagnosis not present

## 2021-05-14 DIAGNOSIS — G992 Myelopathy in diseases classified elsewhere: Secondary | ICD-10-CM | POA: Diagnosis not present

## 2021-05-14 DIAGNOSIS — M4802 Spinal stenosis, cervical region: Secondary | ICD-10-CM | POA: Diagnosis not present

## 2021-06-10 DIAGNOSIS — M170 Bilateral primary osteoarthritis of knee: Secondary | ICD-10-CM | POA: Diagnosis not present

## 2021-06-16 ENCOUNTER — Encounter: Payer: Self-pay | Admitting: Physical Medicine and Rehabilitation

## 2021-06-16 ENCOUNTER — Encounter
Payer: BC Managed Care – PPO | Attending: Physical Medicine and Rehabilitation | Admitting: Physical Medicine and Rehabilitation

## 2021-06-16 ENCOUNTER — Other Ambulatory Visit: Payer: Self-pay

## 2021-06-16 VITALS — BP 153/96 | HR 73 | Temp 98.3°F | Ht 74.0 in | Wt 198.2 lb

## 2021-06-16 DIAGNOSIS — G959 Disease of spinal cord, unspecified: Secondary | ICD-10-CM | POA: Diagnosis not present

## 2021-06-16 DIAGNOSIS — G894 Chronic pain syndrome: Secondary | ICD-10-CM | POA: Insufficient documentation

## 2021-06-16 DIAGNOSIS — R252 Cramp and spasm: Secondary | ICD-10-CM | POA: Diagnosis not present

## 2021-06-16 MED ORDER — BACLOFEN 20 MG PO TABS: 30.0000 mg | ORAL_TABLET | Freq: Three times a day (TID) | ORAL | 5 refills | Status: DC

## 2021-06-16 NOTE — Patient Instructions (Signed)
Pt is a 55 yr old male with cervical myelopathy s/p ACDF, B/L blindness, B/L L>R  knee pain s/p steroid injection 03/10/21, Here for f/u on SCI.  Will increase Baclofen to 30 mg 3x/day  #135 with 5 refills. Won't go to 40 mg at this time since can be sedating?  2. I think your spasticity IS getting worse esp in left leg- however we are keeping up with it better than expected.   3. Has appointment with Delbert Harness on 8/18-   4. The only way to know if stiffness from knees or spasticity- is to increase Baclofen- wait on dantrolene for spasticity for now.   5. F/U in 3 months.

## 2021-06-16 NOTE — Progress Notes (Signed)
Subjective:    Patient ID: Wayne Mckinney, male    DOB: Apr 08, 1966, 55 y.o.   MRN: [id number]  HPI  Pt is a 55 yr old male with cervical myelopathy s/p ACDF, B/L blindness, B/L L>R  knee pain s/p steroid injection 03/10/21, Here for f/u on SCI.  Is going to get MRI's on knees- 06/26/21 they are scheduled.  And then going to see Dr Lucie Leather afterwards for B/L knee steroid injections.    Needs to pick up Baclofen today.   Spasms and tightness about the same- not worse. But not better with increase in Baclofen .   No sleepiness on baclofen- Sleeps well at night- not nodding off during the day.    L knee >R knee with stiffen up esp if sits for too long or legs don't straighten out when walks.   C/O stiffness more than pain.   Doesn't take any tylenol or anything for pain.  Voltaren gel didn't help knee pain - helped "outside' but not "inside".  Nothing helps stiffness per se.     Pain Inventory Average Pain 6 Pain Right Now 7 My pain is intermittent, burning, dull, stabbing, tingling, and aching  In the last 24 hours, has pain interfered with the following? General activity 6 Relation with others 6 Enjoyment of life 6 What TIME of day is your pain at its worst? varies Sleep (in general) Poor  Pain is worse with: walking, bending, sitting, standing, and some activites Pain improves with: rest, therapy/exercise, and medication Relief from Meds: 4  Family History  Problem Relation Age of Onset   Cataracts Father    Social History   Socioeconomic History   Marital status: Married    Spouse name: Not on file   Number of children: Not on file   Years of education: Not on file   Highest education level: Not on file  Occupational History   Not on file  Tobacco Use   Smoking status: Former   Smokeless tobacco: Former  Building services engineer Use: Never used  Substance and Sexual Activity   Alcohol use: Yes    Alcohol/week: 2.0 - 3.0 standard drinks    Types: 2 -  3 Shots of liquor per week   Drug use: Never   Sexual activity: Not on file  Other Topics Concern   Not on file  Social History Narrative   Right handed   Drinks caffeine   Two story home   Social Determinants of Health   Financial Resource Strain: Not on file  Food Insecurity: Not on file  Transportation Needs: Not on file  Physical Activity: Not on file  Stress: Not on file  Social Connections: Not on file   Past Surgical History:  Procedure Laterality Date   ANTERIOR CERVICAL DECOMP/DISCECTOMY FUSION N/A 11/19/2020   Procedure: ANTERIOR CERVICAL DECOMPRESSION FUSION CERVICAL FIVE-SIX, CERVICAL SIX-SEVEN;  Surgeon: Bethann Goo, DO;  Location: [facility] OR;  Service: Neurosurgery;  Laterality: N/A;  anterior   NO PAST SURGERIES     Past Surgical History:  Procedure Laterality Date   ANTERIOR CERVICAL DECOMP/DISCECTOMY FUSION N/A 11/19/2020   Procedure: ANTERIOR CERVICAL DECOMPRESSION FUSION CERVICAL FIVE-SIX, CERVICAL SIX-SEVEN;  Surgeon: Dawley, Alan Mulder, DO;  Location: [facility] OR;  Service: Neurosurgery;  Laterality: N/A;  anterior   NO PAST SURGERIES     Past Medical History:  Diagnosis Date   Hypertension    Legally blind 2007   due to macular degeneration    BP Marland Kitchen)  153/96 (BP Location: Right Arm, Patient Position: Sitting, Cuff Size: Large)   Pulse 73   Temp 98.3 F (36.8 C) (Oral)   Ht 6\' 2"  (1.88 m)   Wt 198 lb 3.2 oz (89.9 kg)   SpO2 90%   BMI 25.45 kg/m   Opioid Risk Score:   Fall Risk Score:  `1  Depression screen PHQ 2/9  Depression screen [facility] 2/9 01/20/2021 12/16/2020  Decreased Interest 0 0  Down, Depressed, Hopeless 0 0  PHQ - 2 Score 0 0  Altered sleeping - 1  Tired, decreased energy - 1  Change in appetite - 3  Feeling bad or failure about yourself  - 0  Trouble concentrating - 0  Moving slowly or fidgety/restless - 1  Suicidal thoughts - 0  PHQ-9 Score - 6  Difficult doing work/chores - Somewhat difficult     Review of Systems  Musculoskeletal:   Positive for back pain and gait problem.       Bilateral knee pain  All other systems reviewed and are negative.     Objective:   Physical Exam  Awake, alert, appropriate- alone in room, wearing large sunglasses, has cane with him, NAD  Hard to fully extend the L knee- lacking ~ 10 degrees  Neuro: MAS of 2-3 in LLE- up from 1+ to 2.  MAS of RLE 1 in hip/knee and R ankle No clonus B/L         Assessment & Plan:   Pt is a 55 yr old male with cervical myelopathy s/p ACDF, B/L blindness, B/L L>R  knee pain s/p steroid injection 03/10/21, Here for f/u on SCI.  Will increase Baclofen to 30 mg 3x/day  #135 with 5 refills. Won't go to 40 mg at this time since can be sedating?  2. I think your spasticity IS getting worse esp in left leg- however we are keeping up with it better than expected.   3. Has appointment with 05/10/21 on 8/18-   4. The only way to know if stiffness from knees or spasticity- is to increase Baclofen- wait on dantrolene for spasticity for now.   5. F/U in 3 months.

## 2021-06-26 DIAGNOSIS — M170 Bilateral primary osteoarthritis of knee: Secondary | ICD-10-CM | POA: Diagnosis not present

## 2021-07-11 DIAGNOSIS — M179 Osteoarthritis of knee, unspecified: Secondary | ICD-10-CM | POA: Diagnosis not present

## 2021-07-11 DIAGNOSIS — H548 Legal blindness, as defined in USA: Secondary | ICD-10-CM | POA: Diagnosis not present

## 2021-07-11 DIAGNOSIS — M5136 Other intervertebral disc degeneration, lumbar region: Secondary | ICD-10-CM | POA: Diagnosis not present

## 2021-07-11 DIAGNOSIS — I10 Essential (primary) hypertension: Secondary | ICD-10-CM | POA: Diagnosis not present

## 2021-07-16 DIAGNOSIS — M170 Bilateral primary osteoarthritis of knee: Secondary | ICD-10-CM | POA: Diagnosis not present

## 2021-07-22 DIAGNOSIS — M1712 Unilateral primary osteoarthritis, left knee: Secondary | ICD-10-CM | POA: Diagnosis not present

## 2021-07-30 DIAGNOSIS — R262 Difficulty in walking, not elsewhere classified: Secondary | ICD-10-CM | POA: Diagnosis not present

## 2021-07-30 DIAGNOSIS — M24561 Contracture, right knee: Secondary | ICD-10-CM | POA: Diagnosis not present

## 2021-07-30 DIAGNOSIS — M24562 Contracture, left knee: Secondary | ICD-10-CM | POA: Diagnosis not present

## 2021-07-30 DIAGNOSIS — M6281 Muscle weakness (generalized): Secondary | ICD-10-CM | POA: Diagnosis not present

## 2021-08-07 DIAGNOSIS — M24561 Contracture, right knee: Secondary | ICD-10-CM | POA: Diagnosis not present

## 2021-08-07 DIAGNOSIS — R262 Difficulty in walking, not elsewhere classified: Secondary | ICD-10-CM | POA: Diagnosis not present

## 2021-08-07 DIAGNOSIS — M6281 Muscle weakness (generalized): Secondary | ICD-10-CM | POA: Diagnosis not present

## 2021-08-07 DIAGNOSIS — M24562 Contracture, left knee: Secondary | ICD-10-CM | POA: Diagnosis not present

## 2021-08-14 DIAGNOSIS — M6281 Muscle weakness (generalized): Secondary | ICD-10-CM | POA: Diagnosis not present

## 2021-08-14 DIAGNOSIS — R262 Difficulty in walking, not elsewhere classified: Secondary | ICD-10-CM | POA: Diagnosis not present

## 2021-08-14 DIAGNOSIS — M24561 Contracture, right knee: Secondary | ICD-10-CM | POA: Diagnosis not present

## 2021-08-14 DIAGNOSIS — M25562 Pain in left knee: Secondary | ICD-10-CM | POA: Diagnosis not present

## 2021-08-21 DIAGNOSIS — M6281 Muscle weakness (generalized): Secondary | ICD-10-CM | POA: Diagnosis not present

## 2021-08-21 DIAGNOSIS — R262 Difficulty in walking, not elsewhere classified: Secondary | ICD-10-CM | POA: Diagnosis not present

## 2021-08-21 DIAGNOSIS — M24561 Contracture, right knee: Secondary | ICD-10-CM | POA: Diagnosis not present

## 2021-08-21 DIAGNOSIS — M1712 Unilateral primary osteoarthritis, left knee: Secondary | ICD-10-CM | POA: Diagnosis not present

## 2021-08-28 DIAGNOSIS — M6281 Muscle weakness (generalized): Secondary | ICD-10-CM | POA: Diagnosis not present

## 2021-08-28 DIAGNOSIS — M1712 Unilateral primary osteoarthritis, left knee: Secondary | ICD-10-CM | POA: Diagnosis not present

## 2021-08-28 DIAGNOSIS — M25561 Pain in right knee: Secondary | ICD-10-CM | POA: Diagnosis not present

## 2021-08-28 DIAGNOSIS — R262 Difficulty in walking, not elsewhere classified: Secondary | ICD-10-CM | POA: Diagnosis not present

## 2021-09-04 DIAGNOSIS — M24562 Contracture, left knee: Secondary | ICD-10-CM | POA: Diagnosis not present

## 2021-09-04 DIAGNOSIS — R262 Difficulty in walking, not elsewhere classified: Secondary | ICD-10-CM | POA: Diagnosis not present

## 2021-09-04 DIAGNOSIS — M6281 Muscle weakness (generalized): Secondary | ICD-10-CM | POA: Diagnosis not present

## 2021-09-04 DIAGNOSIS — M24561 Contracture, right knee: Secondary | ICD-10-CM | POA: Diagnosis not present

## 2021-09-11 DIAGNOSIS — M6281 Muscle weakness (generalized): Secondary | ICD-10-CM | POA: Diagnosis not present

## 2021-09-11 DIAGNOSIS — M24562 Contracture, left knee: Secondary | ICD-10-CM | POA: Diagnosis not present

## 2021-09-11 DIAGNOSIS — R262 Difficulty in walking, not elsewhere classified: Secondary | ICD-10-CM | POA: Diagnosis not present

## 2021-09-11 DIAGNOSIS — M24561 Contracture, right knee: Secondary | ICD-10-CM | POA: Diagnosis not present

## 2021-09-17 ENCOUNTER — Other Ambulatory Visit: Payer: Self-pay

## 2021-09-17 ENCOUNTER — Encounter
Payer: BC Managed Care – PPO | Attending: Physical Medicine and Rehabilitation | Admitting: Physical Medicine and Rehabilitation

## 2021-09-17 ENCOUNTER — Encounter: Payer: Self-pay | Admitting: Physical Medicine and Rehabilitation

## 2021-09-17 VITALS — BP 161/95 | HR 61 | Ht 73.0 in | Wt 194.0 lb

## 2021-09-17 DIAGNOSIS — G959 Disease of spinal cord, unspecified: Secondary | ICD-10-CM | POA: Diagnosis not present

## 2021-09-17 DIAGNOSIS — M1711 Unilateral primary osteoarthritis, right knee: Secondary | ICD-10-CM | POA: Insufficient documentation

## 2021-09-17 DIAGNOSIS — M1712 Unilateral primary osteoarthritis, left knee: Secondary | ICD-10-CM | POA: Insufficient documentation

## 2021-09-17 DIAGNOSIS — R252 Cramp and spasm: Secondary | ICD-10-CM | POA: Insufficient documentation

## 2021-09-17 MED ORDER — BACLOFEN 20 MG PO TABS: 20.0000 mg | ORAL_TABLET | Freq: Three times a day (TID) | ORAL | 5 refills | Status: DC

## 2021-09-17 NOTE — Progress Notes (Signed)
Subjective:    Patient ID: Wayne Mckinney, male    DOB: 1966-03-16, 55 y.o.   MRN: [id number]  HPI  Pt is a 55 yr old male with cervical myelopathy/incomplete quadriplegia ASIA D  s/p ACDF, B/L blindness, B/L L>R  knee pain s/p steroid injection 03/10/21, didn't help and meniscal tears.  Here for f/u on SCI.   Pt was passed on to Dr Lucie Leather on knees/ortho- nothing "broken"- and was sent to PT since told surgery wasn't helpful.  Doing exercises from PT- really enjoys it-  PT wanted him to ask me- 1x/week currently and wants to know if needs to extend.   No knee injection. Knee injection doesn't help, when I did the injection.   Can do the exercises himself- has resistance bands and printed out his exercises for him to do.   For spasticity, doing 20 mg 3x/day- 30 mg 3x/day made him too sleepy.   Taking Meloxicam 15 mg daily- not sure if it helps knee pain.  Is hopeful it helps.  Not using topical gel on knees B/L .  Nothing works real well.   Social Hx:  Is back to work-  Separated from wife- in April- going for divorce next year Are still cordial- but sad about it.    Pain Inventory Average Pain 7 Pain Right Now 4 My pain is intermittent, burning, dull, stabbing, tingling, and aching  In the last 24 hours, has pain interfered with the following? General activity 4 Relation with others 4 Enjoyment of life 4 What TIME of day is your pain at its worst? varies Sleep (in general) Poor  Pain is worse with: walking, bending, sitting, standing, and some activites Pain improves with: rest, therapy/exercise, and medication Relief from Meds: 4  Family History  Problem Relation Age of Onset   Cataracts Father    Social History   Socioeconomic History   Marital status: Married    Spouse name: Not on file   Number of children: Not on file   Years of education: Not on file   Highest education level: Not on file  Occupational History   Not on file  Tobacco Use   Smoking  status: Former   Smokeless tobacco: Former  Building services engineer Use: Never used  Substance and Sexual Activity   Alcohol use: Yes    Alcohol/week: 2.0 - 3.0 standard drinks    Types: 2 - 3 Shots of liquor per week   Drug use: Never   Sexual activity: Not on file  Other Topics Concern   Not on file  Social History Narrative   Right handed   Drinks caffeine   Two story home   Social Determinants of Health   Financial Resource Strain: Not on file  Food Insecurity: Not on file  Transportation Needs: Not on file  Physical Activity: Not on file  Stress: Not on file  Social Connections: Not on file   Past Surgical History:  Procedure Laterality Date   ANTERIOR CERVICAL DECOMP/DISCECTOMY FUSION N/A 11/19/2020   Procedure: ANTERIOR CERVICAL DECOMPRESSION FUSION CERVICAL FIVE-SIX, CERVICAL SIX-SEVEN;  Surgeon: Bethann Goo, DO;  Location: [facility] OR;  Service: Neurosurgery;  Laterality: N/A;  anterior   NO PAST SURGERIES     Past Surgical History:  Procedure Laterality Date   ANTERIOR CERVICAL DECOMP/DISCECTOMY FUSION N/A 11/19/2020   Procedure: ANTERIOR CERVICAL DECOMPRESSION FUSION CERVICAL FIVE-SIX, CERVICAL SIX-SEVEN;  Surgeon: Dawley, Alan Mulder, DO;  Location: [facility] OR;  Service: Neurosurgery;  Laterality: N/A;  anterior   NO PAST SURGERIES     Past Medical History:  Diagnosis Date   Hypertension    Legally blind 2007   due to macular degeneration    BP (!) 161/95   Pulse 61   Ht 6\' 1"  (1.854 m)   Wt 194 lb (88 kg)   SpO2 98%   BMI 25.60 kg/m   Opioid Risk Score:   Fall Risk Score:  `1  Depression screen PHQ 2/9  Depression screen [facility] 2/9 01/20/2021 12/16/2020  Decreased Interest 0 0  Down, Depressed, Hopeless 0 0  PHQ - 2 Score 0 0  Altered sleeping - 1  Tired, decreased energy - 1  Change in appetite - 3  Feeling bad or failure about yourself  - 0  Trouble concentrating - 0  Moving slowly or fidgety/restless - 1  Suicidal thoughts - 0  PHQ-9 Score - 6  Difficult  doing work/chores - Somewhat difficult     Review of Systems  Musculoskeletal:  Positive for back pain and gait problem.       Bilateral knee pain  All other systems reviewed and are negative.     Objective:   Physical Exam  Awake, alert, appropriate, alone; wearing sunglasses/blind; NAD Neuro: MAS of 1+ to 2 in L knee, but MAS of 1 in L hip and ankle; MAS of max of 1 on RLE MS:  L knee lacking ~15 degrees of full extension, but R knee lacking <5 degrees- has a baker's cyst on L>R Walking well- without RW      Assessment & Plan:    Pt is a 55 yr old male with cervical myelopathy/incomplete quadriplegia ASIA D  s/p ACDF, B/L blindness, B/L L>R  knee pain s/p steroid injection 03/10/21, didn't help and meniscal tears.  Here for f/u on SCI and B/L knee pain.     Suggest not extending more PT- right now- so can "tune him up" next summer if goes downhill in function by then; which is common.   2.  We discussed his separation- and his concerns   3.  Con't Meloxicam based on Ortho recommendations. And take daily.    4.  Con't Baclofen at 20 mg 3x/day- for spasticity- spasticity is getting better- sent in 5 refills to [facility] [address]   5. F/U in 4 months- call me if you need me.    I spent a total of 23 minutes on total care/visit- discussing his concerns about Baclofen, pain and wife/separation.

## 2021-09-17 NOTE — Patient Instructions (Addendum)
Pt is a 55 yr old male with cervical myelopathy/incomplete quadriplegia ASIA D  s/p ACDF, B/L blindness, B/L L>R  knee pain s/p steroid injection 03/10/21, didn't help and meniscal tears.  Here for f/u on SCI and B/L knee pain.     Suggest not extending more PT- right now- so can "tune him up" next summer if goes downhill in function by then; which is common.   2.  We discussed his separation- and his concerns   3.  Con't Meloxicam based on Ortho recommendations. And take daily.    4.  Con't Baclofen at 20 mg 3x/day (was taking 20 mg- didn't tolerate 30 mg)- for spasticity- spasticity is getting better- sent in 5 refills to [facility] [address]   5. F/U in 4 months- call me if you need me.

## 2021-09-18 DIAGNOSIS — R262 Difficulty in walking, not elsewhere classified: Secondary | ICD-10-CM | POA: Diagnosis not present

## 2021-09-18 DIAGNOSIS — M6281 Muscle weakness (generalized): Secondary | ICD-10-CM | POA: Diagnosis not present

## 2021-09-18 DIAGNOSIS — M24561 Contracture, right knee: Secondary | ICD-10-CM | POA: Diagnosis not present

## 2021-09-18 DIAGNOSIS — M24562 Contracture, left knee: Secondary | ICD-10-CM | POA: Diagnosis not present

## 2021-10-14 DIAGNOSIS — M179 Osteoarthritis of knee, unspecified: Secondary | ICD-10-CM | POA: Diagnosis not present

## 2021-10-14 DIAGNOSIS — I10 Essential (primary) hypertension: Secondary | ICD-10-CM | POA: Diagnosis not present

## 2021-10-14 DIAGNOSIS — M5136 Other intervertebral disc degeneration, lumbar region: Secondary | ICD-10-CM | POA: Diagnosis not present

## 2021-10-14 DIAGNOSIS — M4712 Other spondylosis with myelopathy, cervical region: Secondary | ICD-10-CM | POA: Diagnosis not present

## 2021-11-12 DIAGNOSIS — I10 Essential (primary) hypertension: Secondary | ICD-10-CM | POA: Diagnosis not present

## 2021-11-12 DIAGNOSIS — M4802 Spinal stenosis, cervical region: Secondary | ICD-10-CM | POA: Diagnosis not present

## 2021-11-12 DIAGNOSIS — Z6825 Body mass index (BMI) 25.0-25.9, adult: Secondary | ICD-10-CM | POA: Diagnosis not present

## 2022-01-13 ENCOUNTER — Other Ambulatory Visit: Payer: Self-pay | Admitting: Internal Medicine

## 2022-01-13 DIAGNOSIS — Z131 Encounter for screening for diabetes mellitus: Secondary | ICD-10-CM | POA: Diagnosis not present

## 2022-01-13 DIAGNOSIS — E559 Vitamin D deficiency, unspecified: Secondary | ICD-10-CM | POA: Diagnosis not present

## 2022-01-13 DIAGNOSIS — Z13220 Encounter for screening for lipoid disorders: Secondary | ICD-10-CM | POA: Diagnosis not present

## 2022-01-13 DIAGNOSIS — Z125 Encounter for screening for malignant neoplasm of prostate: Secondary | ICD-10-CM | POA: Diagnosis not present

## 2022-01-13 DIAGNOSIS — Z0000 Encounter for general adult medical examination without abnormal findings: Secondary | ICD-10-CM | POA: Diagnosis not present

## 2022-01-13 DIAGNOSIS — M179 Osteoarthritis of knee, unspecified: Secondary | ICD-10-CM | POA: Diagnosis not present

## 2022-01-13 DIAGNOSIS — H6122 Impacted cerumen, left ear: Secondary | ICD-10-CM | POA: Diagnosis not present

## 2022-01-13 DIAGNOSIS — I10 Essential (primary) hypertension: Secondary | ICD-10-CM | POA: Diagnosis not present

## 2022-01-14 LAB — COMPLETE METABOLIC PANEL WITH GFR
AG Ratio: 1.6 (calc) (ref 1.0–2.5)
ALT: 9 U/L (ref 9–46)
AST: 15 U/L (ref 10–35)
Albumin: 4.3 g/dL (ref 3.6–5.1)
Alkaline phosphatase (APISO): 99 U/L (ref 35–144)
BUN: 14 mg/dL (ref 7–25)
CO2: 29 mmol/L (ref 20–32)
Calcium: 9.7 mg/dL (ref 8.6–10.3)
Chloride: 101 mmol/L (ref 98–110)
Creat: 1.1 mg/dL (ref 0.70–1.30)
Globulin: 2.7 g/dL (calc) (ref 1.9–3.7)
Glucose, Bld: 74 mg/dL (ref 65–99)
Potassium: 3.8 mmol/L (ref 3.5–5.3)
Sodium: 139 mmol/L (ref 135–146)
Total Bilirubin: 1 mg/dL (ref 0.2–1.2)
Total Protein: 7 g/dL (ref 6.1–8.1)
eGFR: 79 mL/min/{1.73_m2} (ref >=60)

## 2022-01-14 LAB — CBC
HCT: 43.3 % (ref 38.5–50.0)
Hemoglobin: 14.6 g/dL (ref 13.2–17.1)
MCH: 29.1 pg (ref 27.0–33.0)
MCHC: 33.7 g/dL (ref 32.0–36.0)
MCV: 86.3 fL (ref 80.0–100.0)
MPV: 10.9 fL (ref 7.5–12.5)
Platelets: 199 10*3/uL (ref 140–400)
RBC: 5.02 10*6/uL (ref 4.20–5.80)
RDW: 13.6 % (ref 11.0–15.0)
WBC: 4.1 10*3/uL (ref 3.8–10.8)

## 2022-01-14 LAB — LIPID PANEL
Cholesterol: 187 mg/dL (ref <200)
HDL: 63 mg/dL (ref >=40)
LDL Cholesterol (Calc): 109 mg/dL (calc) — ABNORMAL HIGH
Non-HDL Cholesterol (Calc): 124 mg/dL (calc) (ref <130)
Total CHOL/HDL Ratio: 3 (calc) (ref <5.0)
Triglycerides: 63 mg/dL (ref <150)

## 2022-01-14 LAB — PSA: PSA: 0.66 ng/mL (ref <=4.00)

## 2022-01-14 LAB — TSH: TSH: 3.06 mIU/L (ref 0.40–4.50)

## 2022-01-14 LAB — VITAMIN D 25 HYDROXY (VIT D DEFICIENCY, FRACTURES): Vit D, 25-Hydroxy: 11 ng/mL — ABNORMAL LOW (ref 30–100)

## 2022-01-16 ENCOUNTER — Encounter
Payer: BC Managed Care – PPO | Attending: Physical Medicine and Rehabilitation | Admitting: Physical Medicine and Rehabilitation

## 2022-01-16 ENCOUNTER — Encounter: Payer: Self-pay | Admitting: Physical Medicine and Rehabilitation

## 2022-01-16 ENCOUNTER — Other Ambulatory Visit: Payer: Self-pay

## 2022-01-16 VITALS — BP 149/86 | HR 50 | Ht 73.0 in | Wt 194.0 lb

## 2022-01-16 DIAGNOSIS — Z79899 Other long term (current) drug therapy: Secondary | ICD-10-CM | POA: Diagnosis not present

## 2022-01-16 DIAGNOSIS — G894 Chronic pain syndrome: Secondary | ICD-10-CM | POA: Insufficient documentation

## 2022-01-16 DIAGNOSIS — Z79891 Long term (current) use of opiate analgesic: Secondary | ICD-10-CM | POA: Diagnosis not present

## 2022-01-16 DIAGNOSIS — S83207D Unspecified tear of unspecified meniscus, current injury, left knee, subsequent encounter: Secondary | ICD-10-CM | POA: Diagnosis not present

## 2022-01-16 MED ORDER — BACLOFEN 20 MG PO TABS: 20.0000 mg | ORAL_TABLET | Freq: Three times a day (TID) | ORAL | 5 refills | Status: DC

## 2022-01-16 NOTE — Progress Notes (Signed)
? ?Subjective:  ? ? Patient ID: Wayne Mckinney, male    DOB: 10/08/1966, 56 y.o.   MRN: [id number] ? ?HPI ?Pt is a 56 yr old male with cervical myelopathy/incomplete quadriplegia ASIA D  s/p ACDF, B/L blindness, ?B/L L>R  knee pain s/p steroid injection 03/10/21, didn't help and meniscal tears.  ?Here for f/u on SCI and B/L knee pain.   ? ? ?Knees are really bothering him.  ?Muscles feel so tight in back of knees and feet are still tingling.  ? ?Feels like pops out of place- L one more than R one- sounds like not tracking well.  ? ?Has tried a knee sleeve- one had a hole for patella-  ?It's tight, but doesn't really help.  ?Hasn't really noticed going on stairs or at an ankle.  ? ?The longer he sits, then gets up to walk, really painful. More tight - not stiff.  ? ?Has stopped taking Baclofen- a couple of weeks ago- didn't stop due to side effects-  ?Only takes BP med and Mobic- nothing else.  ? ?Not on Midodrine, Keppra or Robaxin anymore- has stopped. ? ?Upset with [facility]- was supposed to get a referral to Ortho-   ? ?Pain Inventory ?Average Pain 8 ?Pain Right Now 9 ?My pain is intermittent, constant, burning, tingling, and aching ? ?In the last 24 hours, has pain interfered with the following? ?General activity 0 ?Relation with others 0 ?Enjoyment of life 5 ?What TIME of day is your pain at its worst? varies ?Sleep (in general) Fair ? ?Pain is worse with: walking, standing, and some activites ?Pain improves with: rest ?Relief from Meds:  not taking any pain medicine ? ?Family History  ?Problem Relation Age of Onset  ? Cataracts Father   ? ?Social History  ? ?Socioeconomic History  ? Marital status: Married  ?  Spouse name: Not on file  ? Number of children: Not on file  ? Years of education: Not on file  ? Highest education level: Not on file  ?Occupational History  ? Not on file  ?Tobacco Use  ? Smoking status: Former  ? Smokeless tobacco: Former  ?Vaping Use  ? Vaping Use: Never used  ?Substance and Sexual Activity   ? Alcohol use: Yes  ?  Alcohol/week: 2.0 - 3.0 standard drinks  ?  Types: 2 - 3 Shots of liquor per week  ? Drug use: Never  ? Sexual activity: Not on file  ?Other Topics Concern  ? Not on file  ?Social History Narrative  ? Right handed  ? Drinks caffeine  ? Two story home  ? ?Social Determinants of Health  ? ?Financial Resource Strain: Not on file  ?Food Insecurity: Not on file  ?Transportation Needs: Not on file  ?Physical Activity: Not on file  ?Stress: Not on file  ?Social Connections: Not on file  ? ?Past Surgical History:  ?Procedure Laterality Date  ? ANTERIOR CERVICAL DECOMP/DISCECTOMY FUSION N/A 11/19/2020  ? Procedure: ANTERIOR CERVICAL DECOMPRESSION FUSION CERVICAL FIVE-SIX, CERVICAL SIX-SEVEN;  Surgeon: Dawley, Alan Mulder, DO;  Location: [facility] OR;  Service: Neurosurgery;  Laterality: N/A;  anterior  ? NO PAST SURGERIES    ? ?Past Surgical History:  ?Procedure Laterality Date  ? ANTERIOR CERVICAL DECOMP/DISCECTOMY FUSION N/A 11/19/2020  ? Procedure: ANTERIOR CERVICAL DECOMPRESSION FUSION CERVICAL FIVE-SIX, CERVICAL SIX-SEVEN;  Surgeon: Dawley, Alan Mulder, DO;  Location: [facility] OR;  Service: Neurosurgery;  Laterality: N/A;  anterior  ? NO PAST SURGERIES    ? ?Past Medical History:  ?  Diagnosis Date  ? Hypertension   ? Legally blind 2007  ? due to macular degeneration   ? ?There were no vitals taken for this visit. ? ?Opioid Risk Score:   ?Fall Risk Score:  `1 ? ?Depression screen PHQ 2/9 ? ?Depression screen [facility] 2/9 01/20/2021 12/16/2020  ?Decreased Interest 0 0  ?Down, Depressed, Hopeless 0 0  ?PHQ - 2 Score 0 0  ?Altered sleeping - 1  ?Tired, decreased energy - 1  ?Change in appetite - 3  ?Feeling bad or failure about yourself  - 0  ?Trouble concentrating - 0  ?Moving slowly or fidgety/restless - 1  ?Suicidal thoughts - 0  ?PHQ-9 Score - 6  ?Difficult doing work/chores - Somewhat difficult  ? ? ?Review of Systems  ?Musculoskeletal:   ?     Pain in both legs from knees to feet  ?All other systems reviewed and are  negative. ? ?   ?Objective:  ? Physical Exam ? ?Awake, alert, appropriate, blind; wearing dark sunglasses; has cane; NAD ?C/o 7-8/10 pain ?Neuro: MAS of 2 in L knee/hip- MAS of 1 to 1+ in R hip/knee- less in ankles-  ?L knee slightly off tracking/patella.  ? ?   ?Assessment & Plan:  ?Pt is a 56 yr old male with cervical myelopathy/incomplete quadriplegia ASIA D  s/p ACDF, B/L blindness, ?B/L L>R  knee pain s/p steroid injection 03/10/21, didn't help and meniscal tears.  ?Here for f/u on SCI and B/L knee pain.   ? ? ?Needs to restart taking Baclofen- 1/2 tab 3x/day x 1 week, can increase to 1 tablet 3x/day after the first week- sent in refill of baclofen-  ?2. Educated that spasticity is his main issue- and taking baclofen is essential to keeping spasticity/tightness controlled.  ? ?3.  Hx of L knee injection 5/22- no results- L meniscal tear- already seen a Dr Lucie Leather?- pt doesn't want surgery.   ?Discussed trying to have Dr Lucie Leather do a gel injection to see if that helps. Won't place consult for new orthopedist since already seen Dr Lucie Leather- PM&R with Delbert Harness- [phone]- to see if he can do gel injections.  ?Let them know that's why he wants appt- to make sure they can get insurance auth before appt.  ? ?4.  Spasticity gets worse for up to 2 years after initial injury- and could continue to get worse.  ? ?5.  F/U in 4 months.  ? ? ?I spent a total of  26  minutes on total care today- >50% coordination of care- due to education on spasticity and how to treat as well as knee issues. Doesn't need UDS- not on pain meds anymore.  ? ? ?

## 2022-01-16 NOTE — Patient Instructions (Signed)
Pt is a 56 yr old male with cervical myelopathy/incomplete quadriplegia ASIA D  s/p ACDF, B/L blindness, ?B/L L>R  knee pain s/p steroid injection 03/10/21, didn't help and meniscal tears.  ?Here for f/u on SCI and B/L knee pain.   ? ? ?Needs to restart taking Baclofen- 1/2 tab 3x/day x 1 week, can increase to 1 tablet 3x/day after the first week- sent in refill of baclofen-  ?2. Educated that spasticity is his main issue- and taking baclofen is essential to keeping spasticity/tightness controlled.  ? ?3.  Hx of L knee injection 5/22- no results- L meniscal tear- already seen a Dr Lucie Leather?- pt doesn't want surgery.   ?Discussed trying to have Dr Lucie Leather do a gel injection to see if that helps. Won't place consult for new orthopedist since already seen Dr Lucie Leather- PM&R with Delbert Harness- [phone]- to see if he can do gel injections.  ?Let them know that's why he wants appt- to make sure they can get insurance auth before appt.  ? ?4.  Spasticity gets worse for up to 2 years after initial injury- and could continue to get worse.  ? ?5.  F/U in 4 months.  ?

## 2022-02-27 DIAGNOSIS — M170 Bilateral primary osteoarthritis of knee: Secondary | ICD-10-CM | POA: Diagnosis not present

## 2022-03-08 ENCOUNTER — Other Ambulatory Visit: Payer: Self-pay | Admitting: Physical Medicine and Rehabilitation

## 2022-03-30 DIAGNOSIS — M170 Bilateral primary osteoarthritis of knee: Secondary | ICD-10-CM | POA: Diagnosis not present

## 2022-04-15 DIAGNOSIS — M170 Bilateral primary osteoarthritis of knee: Secondary | ICD-10-CM | POA: Diagnosis not present

## 2022-04-16 DIAGNOSIS — I10 Essential (primary) hypertension: Secondary | ICD-10-CM | POA: Diagnosis not present

## 2022-04-16 DIAGNOSIS — M179 Osteoarthritis of knee, unspecified: Secondary | ICD-10-CM | POA: Diagnosis not present

## 2022-04-16 DIAGNOSIS — M5136 Other intervertebral disc degeneration, lumbar region: Secondary | ICD-10-CM | POA: Diagnosis not present

## 2022-04-16 DIAGNOSIS — H6121 Impacted cerumen, right ear: Secondary | ICD-10-CM | POA: Diagnosis not present

## 2022-07-22 ENCOUNTER — Encounter: Payer: Self-pay | Admitting: Physical Medicine and Rehabilitation

## 2022-07-22 ENCOUNTER — Encounter
Payer: BC Managed Care – PPO | Attending: Physical Medicine and Rehabilitation | Admitting: Physical Medicine and Rehabilitation

## 2022-07-22 VITALS — BP 133/78 | HR 57 | Ht 73.0 in | Wt 208.2 lb

## 2022-07-22 DIAGNOSIS — G959 Disease of spinal cord, unspecified: Secondary | ICD-10-CM | POA: Diagnosis not present

## 2022-07-22 DIAGNOSIS — G629 Polyneuropathy, unspecified: Secondary | ICD-10-CM | POA: Diagnosis not present

## 2022-07-22 DIAGNOSIS — Z5181 Encounter for therapeutic drug level monitoring: Secondary | ICD-10-CM | POA: Insufficient documentation

## 2022-07-22 DIAGNOSIS — Z79891 Long term (current) use of opiate analgesic: Secondary | ICD-10-CM | POA: Insufficient documentation

## 2022-07-22 DIAGNOSIS — G894 Chronic pain syndrome: Secondary | ICD-10-CM | POA: Diagnosis not present

## 2022-07-22 DIAGNOSIS — S8002XS Contusion of left knee, sequela: Secondary | ICD-10-CM | POA: Insufficient documentation

## 2022-07-22 MED ORDER — TRAMADOL HCL 50 MG PO TABS
50.0000 mg | ORAL_TABLET | Freq: Four times a day (QID) | ORAL | 0 refills | Status: DC | PRN
Start: 2022-07-22 — End: 2022-12-14

## 2022-07-22 MED ORDER — GABAPENTIN 300 MG PO CAPS: 300.0000 mg | ORAL_CAPSULE | Freq: Three times a day (TID) | ORAL | 5 refills | Status: DC

## 2022-07-22 NOTE — Progress Notes (Signed)
Subjective:    Patient ID: Wayne Mckinney, male    DOB: 1965-12-04, 56 y.o.   MRN: [id number]  HPI Pt is a 56 yr old male with cervical myelopathy/incomplete quadriplegia ASIA D  s/p ACDF, B/L blindness, B/L L>R  knee pain s/p steroid injection 03/10/21, didn't help and meniscal tears.  Here for f/u on SCI and B/L knee pain.    Hasn't had seizures per pt.   Got gel injections- didn't help at all for B/L knee pain.  Just deals with it on daily basis.   Spasticity-  Was on baclofen 20 mg TID- but stopped taking, since "wasn't helping".   Not having traditional spasticity- after I described it.  Joints in L knee esp -if doesn't stretch, periodically, has to move, since hasn't stretched.   Has tingling in feet- sometimes annoying- feels like walking on pins- asking what neuropathy is?  Asking about TENS unit for knee pain- wasn't sure if could help?    Pain Inventory Average Pain 10 Pain Right Now 4 My pain is intermittent, burning, tingling, and aching  In the last 24 hours, has pain interfered with the following? General activity 4 Relation with others 4 Enjoyment of life 4 What TIME of day is your pain at its worst? morning , daytime, evening, and night Sleep (in general) Good  Pain is worse with: walking, bending, sitting, standing, and some activites Pain improves with: rest Relief from Meds:  na  Family History  Problem Relation Age of Onset   Cataracts Father    Social History   Socioeconomic History   Marital status: Married    Spouse name: Not on file   Number of children: Not on file   Years of education: Not on file   Highest education level: Not on file  Occupational History   Not on file  Tobacco Use   Smoking status: Former   Smokeless tobacco: Former  Building services engineer Use: Never used  Substance and Sexual Activity   Alcohol use: Yes    Alcohol/week: 2.0 - 3.0 standard drinks of alcohol    Types: 2 - 3 Shots of liquor per week   Drug use:  Never   Sexual activity: Not on file  Other Topics Concern   Not on file  Social History Narrative   Right handed   Drinks caffeine   Two story home   Social Determinants of Health   Financial Resource Strain: Not on file  Food Insecurity: Not on file  Transportation Needs: Not on file  Physical Activity: Not on file  Stress: Not on file  Social Connections: Not on file   Past Surgical History:  Procedure Laterality Date   ANTERIOR CERVICAL DECOMP/DISCECTOMY FUSION N/A 11/19/2020   Procedure: ANTERIOR CERVICAL DECOMPRESSION FUSION CERVICAL FIVE-SIX, CERVICAL SIX-SEVEN;  Surgeon: Bethann Goo, DO;  Location: [facility];  Service: Neurosurgery;  Laterality: N/A;  anterior   NO PAST SURGERIES     Past Surgical History:  Procedure Laterality Date   ANTERIOR CERVICAL DECOMP/DISCECTOMY FUSION N/A 11/19/2020   Procedure: ANTERIOR CERVICAL DECOMPRESSION FUSION CERVICAL FIVE-SIX, CERVICAL SIX-SEVEN;  Surgeon: Dawley, Alan Mulder, DO;  Location: [facility];  Service: Neurosurgery;  Laterality: N/A;  anterior   NO PAST SURGERIES     Past Medical History:  Diagnosis Date   Hypertension    Legally blind 2007   due to macular degeneration    BP 133/78   Pulse (!) 57   Ht 6\' 1"  (1.854 m)  Wt 208 lb 3.2 oz (94.4 kg)   SpO2 97%   BMI 27.47 kg/m   Opioid Risk Score:   Fall Risk Score:  `1  Depression screen [facility] 2/9     01/16/2022   10:11 AM 01/20/2021    9:41 AM 12/16/2020   11:23 AM  Depression screen PHQ 2/9  Decreased Interest 0 0 0  Down, Depressed, Hopeless 0 0 0  PHQ - 2 Score 0 0 0  Altered sleeping   1  Tired, decreased energy   1  Change in appetite   3  Feeling bad or failure about yourself    0  Trouble concentrating   0  Moving slowly or fidgety/restless   1  Suicidal thoughts   0  PHQ-9 Score   6  Difficult doing work/chores   Somewhat difficult    Review of Systems  Musculoskeletal:  Positive for back pain. Negative for gait problem.       Bilateral knee  pain Bilateral ankle pain  All other systems reviewed and are negative.     Objective:   Physical Exam  Awake, alert, appropriate, wearing sunglasses; is blind; has cane, NAD Walking with no assistive device       Assessment & Plan:   Pt is a 56 yr old male with cervical myelopathy/incomplete quadriplegia ASIA D  s/p ACDF, B/L blindness, B/L L>R  knee pain s/p steroid injection 03/10/21, didn't help and meniscal tears.  Here for f/u on SCI and B/L knee pain.    Will try Tramadol 50 mg up to 4x/day- but can also take 2 pills 2x/day- to help with B/L knee pain.  Since I'm out next week, will send in 2nd Rx for 1 month's supply that can be cancelled if UDS (+).  2. Opiate contract and UDS- today since hadn't done before.   3. Gel injections didn't help- will try tramadol, since wasn't helpful for meniscal tear pain.   4. Off baclofen- wasn't helping knee pain- realized, likely doesn't have spasticity.   5. F/U in 3 months- no more knee injections- weren't helpful. Double appt  6. Has tingling/and numbness in knees/feet- retry gabapentin 300 mg nightly x 1 day- 1 tab in Am and 2 in evening.    I spent a total of  25  minutes on total care today- >50% coordination of care- due to education on contract, UDS, and tramadol

## 2022-07-22 NOTE — Addendum Note (Signed)
Addended by: Silas Sacramento T on: 07/22/2022 10:38 AM   Modules accepted: Orders

## 2022-07-22 NOTE — Patient Instructions (Signed)
Pt is a 56 yr old male with cervical myelopathy/incomplete quadriplegia ASIA D  s/p ACDF, B/L blindness, B/L L>R  knee pain s/p steroid injection 03/10/21, didn't help and meniscal tears.  Here for f/u on SCI and B/L knee pain.    Will try Tramadol 50 mg up to 4x/day- but can also take 2 pills 2x/day- to help with B/L knee pain.  Since I'm out next week, will send in 2nd Rx for 1 month's supply that can be cancelled if UDS (+).  2. Opiate contract and UDS- today since hadn't done before.   3. Gel injections didn't help- will try tramadol, since wasn't helpful for meniscal tear pain.   4. Off baclofen- wasn't helping knee pain- realized, likely doesn't have spasticity.   5. F/U in 3 months- no more knee injections- weren't helpful. Double appt  6. Has tingling/and numbness in knees/feet- retry gabapentin 300 mg nightly x 1 day- 1 tab in Am and 2 in evening.

## 2022-07-25 LAB — DRUG TOX MONITOR 1 W/CONF, ORAL FLD

## 2022-07-25 LAB — DRUG TOX ALC METAB W/CON, ORAL FLD: Alcohol Metabolite: NEGATIVE ng/mL (ref <25)

## 2022-07-30 ENCOUNTER — Telehealth: Payer: Self-pay | Admitting: *Deleted

## 2022-07-30 NOTE — Telephone Encounter (Signed)
Oral swab drug screen was consistent for prescribed or unprescribed controlled medications.

## 2022-08-14 IMAGING — MR MR CERVICAL SPINE W/O CM
5 of 6 series · 32 of 48 positions shown · non-contrast
Comparison: Thoracic spine MRI 11/11/2020.

CLINICAL DATA: Cervical cord compression with myelopathy.
Additional history provided by scanning technologist: Patient
reports bilateral lower extremity numbness/weakness, unable to
ambulate.

EXAM:
MRI CERVICAL SPINE WITHOUT CONTRAST
TECHNIQUE: Multiplanar, multisequence MR imaging of the cervical spine was
performed. No intravenous contrast was administered.

[Series 5: T1 · sagittal · 3.0mm · 0.59mm/px · 6 of 15 slices shown (1 of 2)]
[im 1/15]
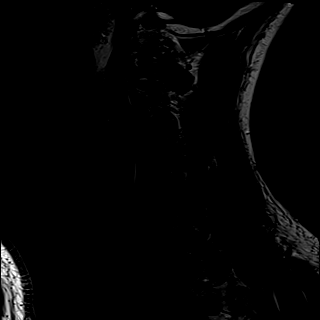
[im 3/15]
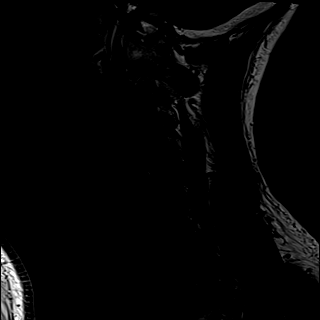
[im 6/15]
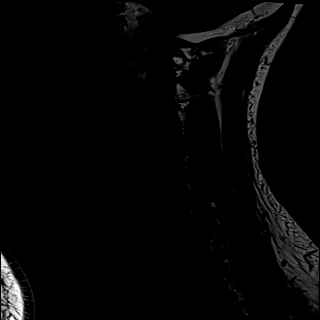
[im 9/15]
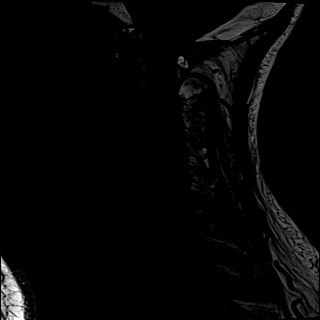
[im 12/15]
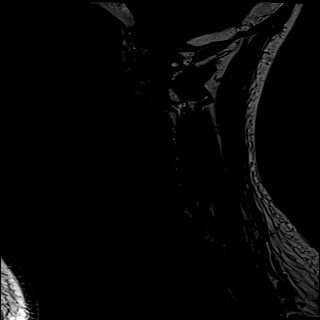
[im 15/15]
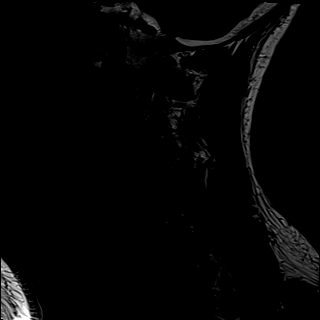

[Series 6: T2 · sagittal · 3.0mm · 0.59mm/px · 6 of 15 slices shown (1 of 2)]
[im 1/15]
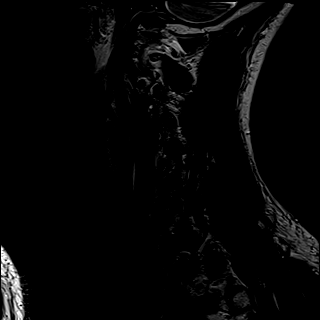
[im 3/15]
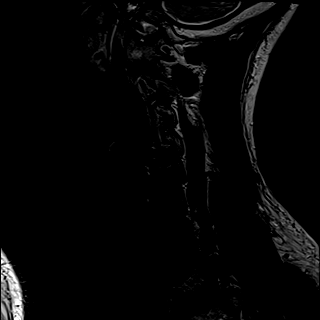
[im 6/15]
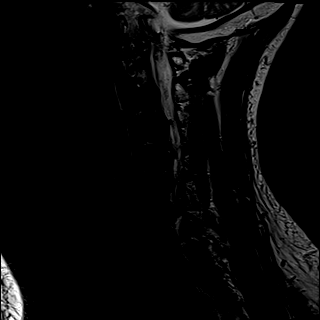
[im 9/15]
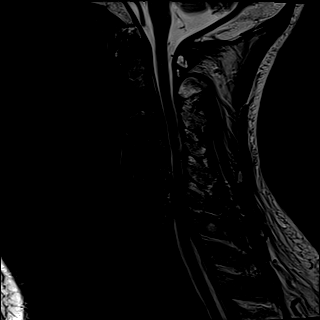
[im 12/15]
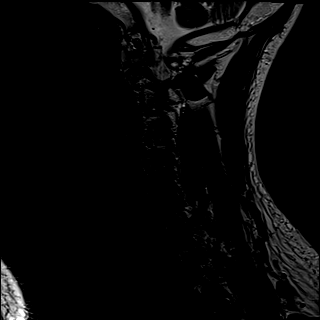
[im 15/15]
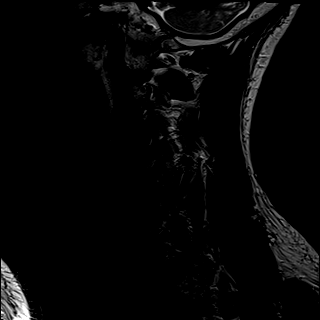

[Series 7: STIR · sagittal · 3.0mm · 0.74mm/px · 2 of 15 slices shown]
[im 1/15]
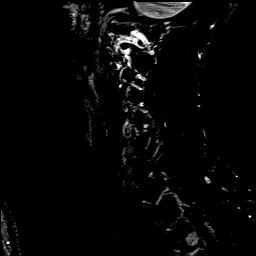
[im 3/15]
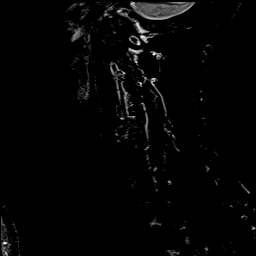

[Series 8: T2 · axial · 3.0mm · 0.70mm/px · z∈[-21,+92]mm · 9 of 33 slices shown (2 of 2)]
[im 1/33]
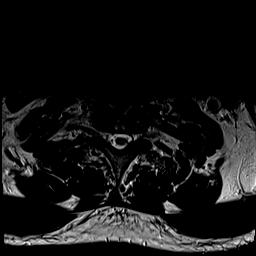
[im 6/33]
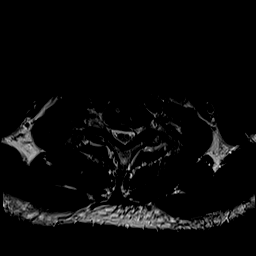
[im 9/33]
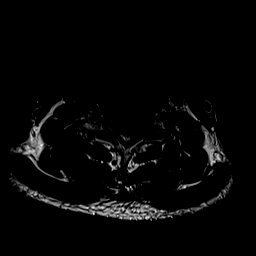
[im 15/33]
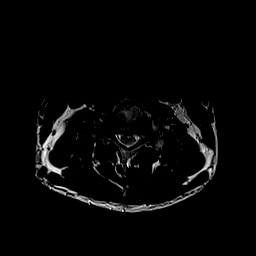
[im 18/33]
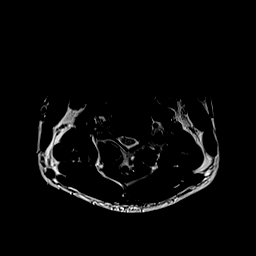
[im 24/33]
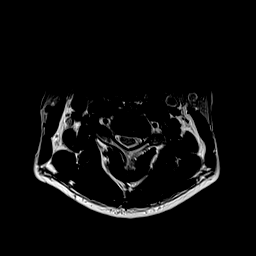
[im 27/33]
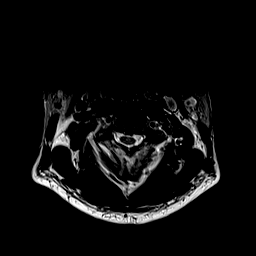
[im 30/33]
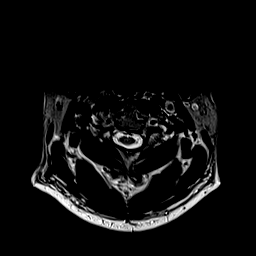
[im 33/33]
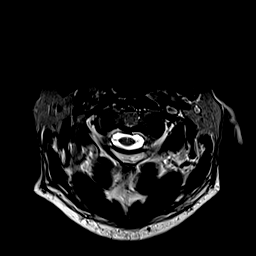

[Series 10: T1 · axial · 3.0mm · 0.35mm/px · z∈[-21,+92]mm · 9 of 33 slices shown (2 of 2)]
[im 1/33]
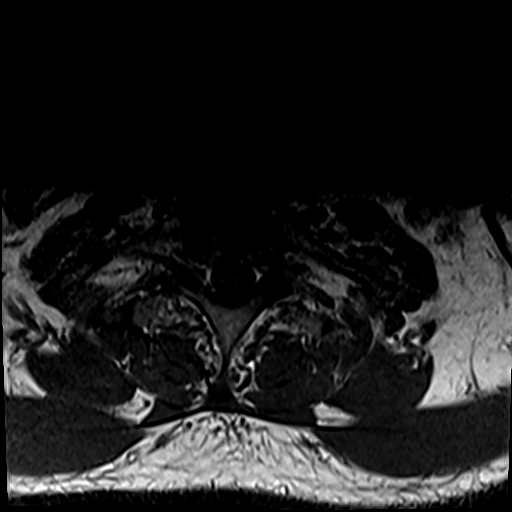
[im 6/33]
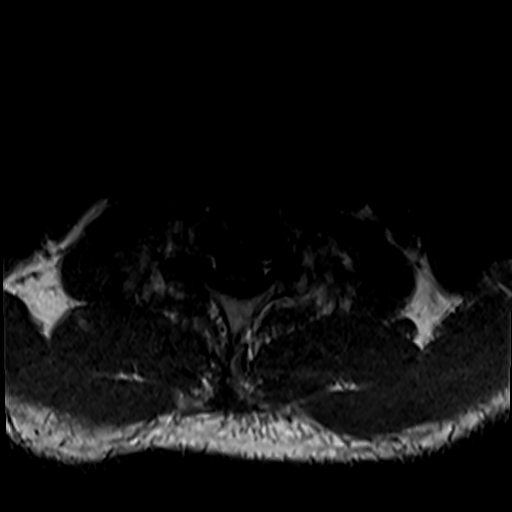
[im 9/33]
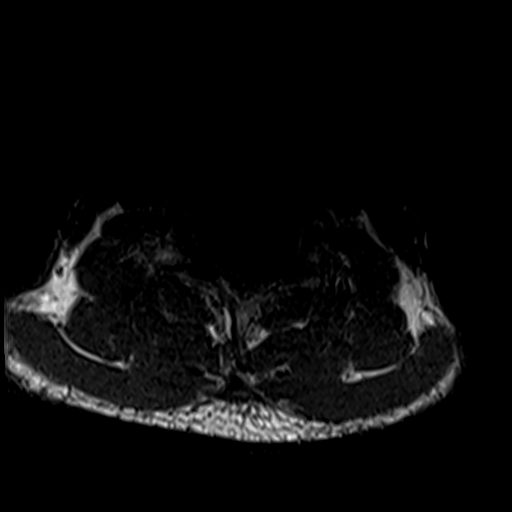
[im 15/33]
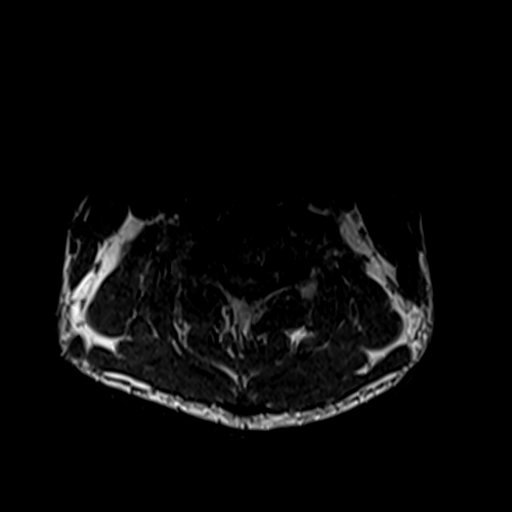
[im 18/33]
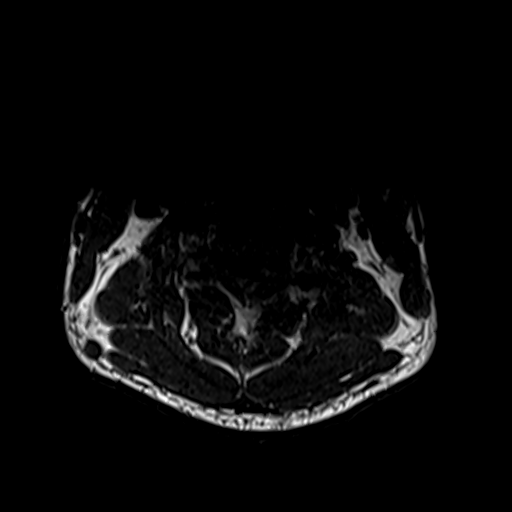
[im 24/33]
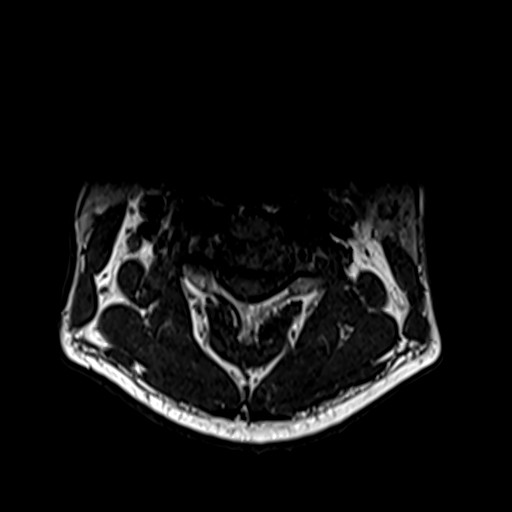
[im 27/33]
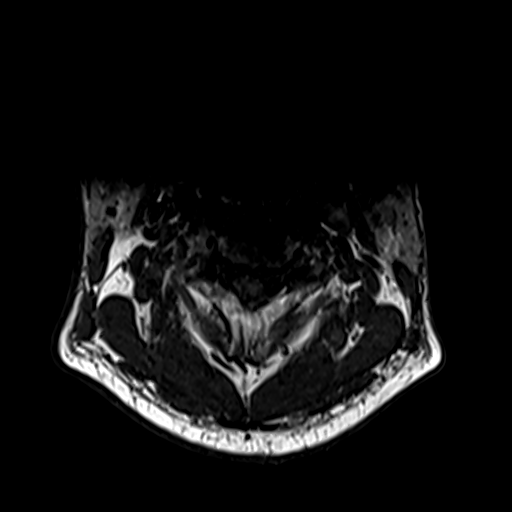
[im 30/33]
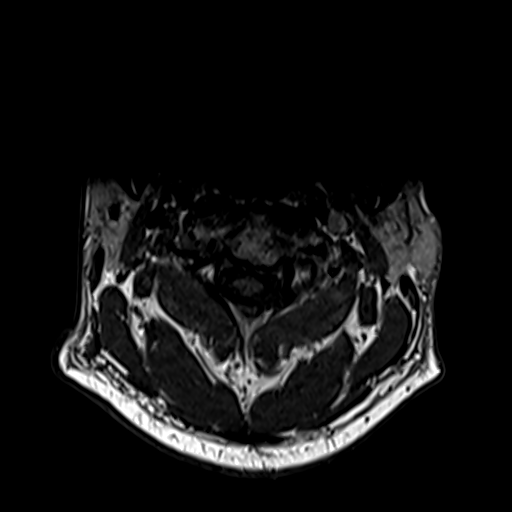
[im 33/33]
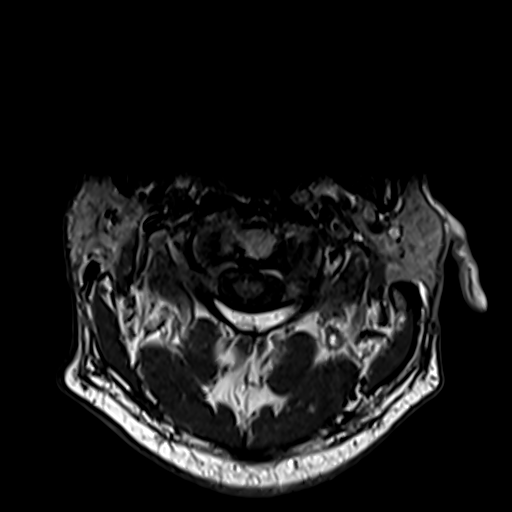

[32 of 48 positions shown; findings below may reference images not displayed]

FINDINGS: Alignment: Nonspecific reversal of the expected cervical lordosis.
Trace C5-C6 grade 1 retrolisthesis.

Vertebrae: Vertebral body height is maintained. Moderate
degenerative endplate edema at C5-C6 and C6-C7.

Cord: Spinal cord compression and signal abnormality at C6-C7 as
described below.

Posterior Fossa, vertebral arteries, paraspinal tissues: No
abnormality identified within included portions of the posterior
fossa. Flow voids preserved within the imaged cervical vertebral
arteries. Paraspinal soft tissues within normal limits.

Disc levels:

Multilevel disc degeneration. Most notably disc degeneration is
moderate at C4-C5, moderate/advanced at C5-C6 and moderate at C6-C7.

Congenitally narrow cervical spinal canal.

C2-C3: Uncinate hypertrophy on the left. No significant spinal canal
stenosis. Mild left neural foraminal narrowing.

C3-C4: Uncinate hypertrophy (greater on the left). No significant
spinal canal stenosis. Bilateral neural foraminal narrowing
(moderate right, severe left).

C4-C5: Left greater than right disc osteophyte ridge/uncinate
hypertrophy. No significant spinal canal stenosis. Severe bilateral
neural foraminal narrowing.

C5-C6: Shallow posterior disc osteophyte complex with bilateral disc
osteophyte ridge/uncinate hypertrophy. Facet and ligamentum flavum
hypertrophy. Moderate spinal canal stenosis with contact upon the
dorsal and ventral spinal cord. Severe bilateral neural foraminal
narrowing.

C6-C7: Posterior disc osteophyte complex with superimposed central
disc protrusion. Uncovertebral, facet and ligamentum flavum
hypertrophy. Severe spinal canal stenosis with spinal cord
compression. T2 hyperintense signal abnormality within the spinal
cord compatible with edema and/or myelomalacia. Severe bilateral
neural foraminal narrowing.

C7-T1: Shallow disc bulge. Mild facet hypertrophy (greater on the
left). No significant spinal canal stenosis. Mild left neural
foraminal narrowing.

Impressions #2 will be called to the ordering clinician or
representative by the Radiologist Assistant, and communication
documented in the PACS or [REDACTED].
IMPRESSION: Cervical spondylosis superimposed upon a congenitally narrow
cervical spinal canal, as outlined with findings most notably as
follows.

At C6-C7, there is a posterior disc osteophyte complex with
superimposed central disc protrusion. Uncovertebral, facet and
ligamentum flavum hypertrophy. Severe spinal canal stenosis with
spinal cord compression. T2 hyperintense signal abnormality within
the spinal cord at this level compatible with edema and/or
myelomalacia. Severe bilateral neural foraminal narrowing.

At C5-C6, there is multifactorial moderate spinal canal stenosis
with contact upon the dorsal and ventral spinal cord. Severe
bilateral neural foraminal narrowing.

No significant spinal canal stenosis at the remaining levels.
Additional sites of neural foraminal narrowing as detailed and
greatest bilaterally at C3-C4 (moderate right, severe left).

Moderate degenerative endplate edema at C5-C6 and C6-C7.

## 2022-10-15 DIAGNOSIS — M179 Osteoarthritis of knee, unspecified: Secondary | ICD-10-CM | POA: Diagnosis not present

## 2022-10-15 DIAGNOSIS — M5136 Other intervertebral disc degeneration, lumbar region: Secondary | ICD-10-CM | POA: Diagnosis not present

## 2022-10-15 DIAGNOSIS — E559 Vitamin D deficiency, unspecified: Secondary | ICD-10-CM | POA: Diagnosis not present

## 2022-10-15 DIAGNOSIS — I10 Essential (primary) hypertension: Secondary | ICD-10-CM | POA: Diagnosis not present

## 2022-10-21 ENCOUNTER — Ambulatory Visit: Payer: BC Managed Care – PPO | Admitting: Physical Medicine and Rehabilitation

## 2022-12-14 ENCOUNTER — Encounter: Payer: Self-pay | Admitting: Physical Medicine and Rehabilitation

## 2022-12-14 ENCOUNTER — Encounter
Payer: BC Managed Care – PPO | Attending: Physical Medicine and Rehabilitation | Admitting: Physical Medicine and Rehabilitation

## 2022-12-14 VITALS — BP 152/87 | HR 51 | Ht 73.0 in | Wt 208.0 lb

## 2022-12-14 DIAGNOSIS — G959 Disease of spinal cord, unspecified: Secondary | ICD-10-CM | POA: Insufficient documentation

## 2022-12-14 DIAGNOSIS — M1712 Unilateral primary osteoarthritis, left knee: Secondary | ICD-10-CM | POA: Diagnosis not present

## 2022-12-14 DIAGNOSIS — M1711 Unilateral primary osteoarthritis, right knee: Secondary | ICD-10-CM | POA: Insufficient documentation

## 2022-12-14 DIAGNOSIS — M170 Bilateral primary osteoarthritis of knee: Secondary | ICD-10-CM | POA: Diagnosis not present

## 2022-12-14 NOTE — Progress Notes (Signed)
Subjective:    Patient ID: Wayne Mckinney, male    DOB: 01/29/1966, 57 y.o.   MRN: [id number]  HPI   Pt is a 57 yr old male with cervical myelopathy/incomplete quadriplegia ASIA D  s/p ACDF, B/L blindness, B/L L>R  knee pain s/p steroid injection 03/10/21, didn't help and meniscal tears.  Here for f/u on SCI and B/L knee pain.      Tramadol hasn't helped knee pain.  Not at all didn't even take the edge off.   Has been taking it-  was taking 50 mg 3x/day-  Since wasn't helping, stopped taking it.   Off gabapentin as well- that wasn't helping.  Uses voltaren gel periodically-    Doesn't want to increase pain meds to Norco.  Pain Inventory Average Pain 9 Pain Right Now 4 My pain is tingling and aching  In the last 24 hours, has pain interfered with the following? General activity 0 Relation with others 0 Enjoyment of life 0 What TIME of day is your pain at its worst? morning  Sleep (in general) Good  Pain is worse with: bending and some activites Pain improves with: therapy/exercise Relief from Meds:  na  Family History  Problem Relation Age of Onset   Cataracts Father    Social History   Socioeconomic History   Marital status: Married    Spouse name: Not on file   Number of children: Not on file   Years of education: Not on file   Highest education level: Not on file  Occupational History   Not on file  Tobacco Use   Smoking status: Former   Smokeless tobacco: Former  Scientific laboratory technician Use: Never used  Substance and Sexual Activity   Alcohol use: Yes    Alcohol/week: 2.0 - 3.0 standard drinks of alcohol    Types: 2 - 3 Shots of liquor per week   Drug use: Never   Sexual activity: Not on file  Other Topics Concern   Not on file  Social History Narrative   Right handed   Drinks caffeine   Two story home   Social Determinants of Health   Financial Resource Strain: Not on file  Food Insecurity: Not on file  Transportation Needs: Not on file   Physical Activity: Not on file  Stress: Not on file  Social Connections: Not on file   Past Surgical History:  Procedure Laterality Date   ANTERIOR CERVICAL DECOMP/DISCECTOMY FUSION N/A 11/19/2020   Procedure: ANTERIOR CERVICAL DECOMPRESSION FUSION CERVICAL FIVE-SIX, CERVICAL SIX-SEVEN;  Surgeon: Karsten Ro, DO;  Location: [facility];  Service: Neurosurgery;  Laterality: N/A;  anterior   NO PAST SURGERIES     Past Surgical History:  Procedure Laterality Date   ANTERIOR CERVICAL DECOMP/DISCECTOMY FUSION N/A 11/19/2020   Procedure: ANTERIOR CERVICAL DECOMPRESSION FUSION CERVICAL FIVE-SIX, CERVICAL SIX-SEVEN;  Surgeon: Dawley, Theodoro Doing, DO;  Location: [place];  Service: Neurosurgery;  Laterality: N/A;  anterior   NO PAST SURGERIES     Past Medical History:  Diagnosis Date   Hypertension    Legally blind 2007   due to macular degeneration    BP (!) 152/87   Pulse (!) 51   Ht 6\' 1"  (1.854 m)   Wt 208 lb (94.3 kg)   SpO2 97%   BMI 27.44 kg/m   Opioid Risk Score:   Fall Risk Score:  `1  Depression screen [facility] 2/9     01/16/2022   10:11 AM 01/20/2021  9:41 AM 12/16/2020   11:23 AM  Depression screen PHQ 2/9  Decreased Interest 0 0 0  Down, Depressed, Hopeless 0 0 0  PHQ - 2 Score 0 0 0  Altered sleeping   1  Tired, decreased energy   1  Change in appetite   3  Feeling bad or failure about yourself    0  Trouble concentrating   0  Moving slowly or fidgety/restless   1  Suicidal thoughts   0  PHQ-9 Score   6  Difficult doing work/chores   Somewhat difficult      Review of Systems  Genitourinary:  Negative for flank pain.  Musculoskeletal:        Bilateral knee pain Bottom of feet pain  All other systems reviewed and are negative.     Objective:   Physical Exam  Awake, alert, appropriate, blind- wearing eyeglasses, NAD MS: 5/5 in Ue's and LE's- B/L   Neuro: Decreased to light touch in L1-S1 B/L - but intact in light touch in arms C4-T12 is intact B/L        Assessment & Plan:   Pt is a 57 yr old male with cervical myelopathy/incomplete quadriplegia ASIA E now-   s/p ACDF, B/L blindness, B/L L>R  knee pain s/p steroid injection 03/10/21, didn't help and meniscal tears.  Here for f/u on SCI and B/L knee pain.      D/C tramadol 2. Discussed using stronger pain medicine- you want to wait at this time. Explained won't make it all go away and he doesn't "want to be drugged up".   3. SCI is now ASIA E- so has completely resolved strength issues from SCI.   4. B/B is going OK.   5. Has tried gel and steroid injections for knees- no improvement.   6. F/U in 6 months- wait on pain meds

## 2022-12-14 NOTE — Patient Instructions (Signed)
Pt is a 57 yr old male with cervical myelopathy/incomplete quadriplegia ASIA E now-   s/p ACDF, B/L blindness, B/L L>R  knee pain s/p steroid injection 03/10/21, didn't help and meniscal tears.  Here for f/u on SCI and B/L knee pain.      D/C tramadol 2. Discussed using stronger pain medicine- you want to wait at this time. Explained won't make it all go away and he doesn't "want to be drugged up".   3. SCI is now ASIA E- so has completely resolved strength issues from SCI.   4. B/B is going OK.   5. Has tried gel and steroid injections for knees- no improvement.   6. F/U in 6 months-

## 2023-06-14 ENCOUNTER — Encounter: Payer: Self-pay | Admitting: Physical Medicine and Rehabilitation

## 2023-06-14 ENCOUNTER — Encounter: Payer: Medicare Other | Admitting: Physical Medicine and Rehabilitation

## 2023-06-14 ENCOUNTER — Encounter
Payer: Medicare Other | Attending: Physical Medicine and Rehabilitation | Admitting: Physical Medicine and Rehabilitation

## 2023-06-14 VITALS — BP 172/101 | HR 62 | Ht 73.0 in | Wt 208.0 lb

## 2023-06-14 DIAGNOSIS — G629 Polyneuropathy, unspecified: Secondary | ICD-10-CM | POA: Insufficient documentation

## 2023-06-14 DIAGNOSIS — S8002XS Contusion of left knee, sequela: Secondary | ICD-10-CM | POA: Insufficient documentation

## 2023-06-14 DIAGNOSIS — R030 Elevated blood-pressure reading, without diagnosis of hypertension: Secondary | ICD-10-CM | POA: Insufficient documentation

## 2023-06-14 DIAGNOSIS — G959 Disease of spinal cord, unspecified: Secondary | ICD-10-CM | POA: Insufficient documentation

## 2023-06-14 NOTE — Patient Instructions (Signed)
Pt is a 57 yr old male with cervical myelopathy/incomplete quadriplegia ASIA D  s/p ACDF, B/L blindness, B/L L>R  knee pain s/p steroid injection 03/10/21, didn't help and meniscal tears.  Here for f/u on SCI and B/L knee pain.    Takes BP medicine- hydrochlorothiazide- Wayne Mckinney- Hyzaar, but was taken off Norvasc at last visit- would restart him on Norvasc.   2. We counseled pt on high BP- would call PCP and ask then to least think about restarting Amlodipine.   3. Not meds for Bowel/bladder  4. No meds for tingling/knee pain.   5. Spasticity resolved.   6. F/U   prn -call if needs appointment.

## 2023-06-14 NOTE — Progress Notes (Signed)
Subjective:    Patient ID: Wayne Mckinney, male    DOB: 1966-05-07, 57 y.o.   MRN: [id number]  HPI Pt is a 57 yr old male with cervical myelopathy/incomplete quadriplegia ASIA D  s/p ACDF, B/L blindness, B/L L>R  knee pain s/p steroid injection 03/10/21, didn't help and meniscal tears.  Here for f/u on SCI and B/L knee pain.    Trying to "hang in there".  After surgery, knees just "aren't the same".   Still has tingling in feet And knees- tingling sensation esp L knee.  Buckles inwardly.   Gel and steroid injections just didn't work at all for pain.  Hx of pain contusion to L knee in 2022.   When stands or sits too long- stiff and hurts- and when stands too long, makes it hurt as well-  "Just trying to deal with it".   Doesn't want stronger pain medicine.  Spoke with Dr Jake Samples- things might subside.  But doesn't think it will get back to prior surgery.   BP 170/100 today.  Was taken off Norvasc- last visit-     Pain Inventory Average Pain 8 Pain Right Now 7 My pain is constant, tingling, aching, and numbness  In the last 24 hours, has pain interfered with the following? General activity 0 Relation with others 0 Enjoyment of life 0 What TIME of day is your pain at its worst? varies Sleep (in general) Fair  Pain is worse with: inactivity Pain improves with: pacing activities Relief from Meds:  none taken it does not work  Family History  Problem Relation Age of Onset   Cataracts Father    Social History   Socioeconomic History   Marital status: Married    Spouse name: Not on file   Number of children: Not on file   Years of education: Not on file   Highest education level: Not on file  Occupational History   Not on file  Tobacco Use   Smoking status: Former   Smokeless tobacco: Former  Advertising account planner   Vaping status: Never Used  Substance and Sexual Activity   Alcohol use: Yes    Alcohol/week: 2.0 - 3.0 standard drinks of alcohol    Types: 2 - 3 Shots  of liquor per week   Drug use: Never   Sexual activity: Not on file  Other Topics Concern   Not on file  Social History Narrative   Right handed   Drinks caffeine   Two story home   Social Determinants of Health   Financial Resource Strain: Not on file  Food Insecurity: Not on file  Transportation Needs: Not on file  Physical Activity: Not on file  Stress: Not on file  Social Connections: Not on file   Past Surgical History:  Procedure Laterality Date   ANTERIOR CERVICAL DECOMP/DISCECTOMY FUSION N/A 11/19/2020   Procedure: ANTERIOR CERVICAL DECOMPRESSION FUSION CERVICAL FIVE-SIX, CERVICAL SIX-SEVEN;  Surgeon: Bethann Goo, DO;  Location: [facility];  Service: Neurosurgery;  Laterality: N/A;  anterior   NO PAST SURGERIES     Past Surgical History:  Procedure Laterality Date   ANTERIOR CERVICAL DECOMP/DISCECTOMY FUSION N/A 11/19/2020   Procedure: ANTERIOR CERVICAL DECOMPRESSION FUSION CERVICAL FIVE-SIX, CERVICAL SIX-SEVEN;  Surgeon: Dawley, Alan Mulder, DO;  Location: [facility];  Service: Neurosurgery;  Laterality: N/A;  anterior   NO PAST SURGERIES     Past Medical History:  Diagnosis Date   Hypertension    Legally blind 2007   due to macular degeneration  BP (!) 170/100   Pulse 60   Ht 6\' 1"  (1.854 m)   Wt 208 lb (94.3 kg)   SpO2 97%   BMI 27.44 kg/m   Opioid Risk Score:   Fall Risk Score:  `1  Depression screen [facility] 2/9     06/14/2023   11:09 AM 01/16/2022   10:11 AM 01/20/2021    9:41 AM 12/16/2020   11:23 AM  Depression screen PHQ 2/9  Decreased Interest 0 0 0 0  Down, Depressed, Hopeless 0 0 0 0  PHQ - 2 Score 0 0 0 0  Altered sleeping    1  Tired, decreased energy    1  Change in appetite    3  Feeling bad or failure about yourself     0  Trouble concentrating    0  Moving slowly or fidgety/restless    1  Suicidal thoughts    0  PHQ-9 Score    6  Difficult doing work/chores    Somewhat difficult    Review of Systems  Musculoskeletal:  Positive for back pain.        Pain in both knees, pain in both feet  All other systems reviewed and are negative.     Objective:   Physical Exam  Awake, alert, appropriate, blind- wearing sunglasses; NAD Using white cane to walk Hoffman's (-) B/L No clonus B/L No increased tone Stiff knees B/L C/o tingling in knees/downwards- neuropathy.        Assessment & Plan:   Pt is a 57 yr old male with cervical myelopathy/incomplete quadriplegia ASIA D  s/p ACDF, B/L blindness, B/L L>R  knee pain s/p steroid injection 03/10/21, didn't help and meniscal tears.  Here for f/u on SCI and B/L knee pain.    Takes BP medicine- hydrochlorothiazide- [name]- Hyzaar, but was taken off Norvasc at last visit- would restart him on Norvasc.   2. We counseled pt on high BP- would call PCP and ask then to least think about restarting Amlodipine.   3. Not meds for Bowel/bladder  4. No meds for tingling/knee pain.   5. Spasticity resolved.   6. F/U   prn -call if needs appointment.     I spent a total of  21  minutes on total care today- >50% coordination of care- due to  D/w pt about elevated BP and how to hopefully get back on Norvasc. Also educated that off meds for spasticity and Sx's resolved as well- cannot fix L>R knee pain, unfortunately.
